# Patient Record
Sex: Male | Born: 1960
Health system: Southern US, Community
[De-identification: ages and names within clinical notes are randomized; demographics above are authoritative.]

## PROBLEM LIST (undated history)

## (undated) DIAGNOSIS — K429 Umbilical hernia without obstruction or gangrene: Secondary | ICD-10-CM

## (undated) DIAGNOSIS — R011 Cardiac murmur, unspecified: Secondary | ICD-10-CM

## (undated) DIAGNOSIS — K859 Acute pancreatitis without necrosis or infection, unspecified: Secondary | ICD-10-CM

## (undated) DIAGNOSIS — R7303 Prediabetes: Secondary | ICD-10-CM

## (undated) DIAGNOSIS — I10 Essential (primary) hypertension: Secondary | ICD-10-CM

## (undated) DIAGNOSIS — R002 Palpitations: Secondary | ICD-10-CM

## (undated) DIAGNOSIS — Z9889 Other specified postprocedural states: Secondary | ICD-10-CM

## (undated) DIAGNOSIS — K219 Gastro-esophageal reflux disease without esophagitis: Secondary | ICD-10-CM

## (undated) DIAGNOSIS — E7800 Pure hypercholesterolemia, unspecified: Secondary | ICD-10-CM

## (undated) DIAGNOSIS — E119 Type 2 diabetes mellitus without complications: Secondary | ICD-10-CM

## (undated) DIAGNOSIS — F419 Anxiety disorder, unspecified: Secondary | ICD-10-CM

## (undated) HISTORY — DX: Gastro-esophageal reflux disease without esophagitis: K21.9

## (undated) HISTORY — PX: OTHER SURGICAL HISTORY: SHX169

## (undated) HISTORY — PX: HERNIA REPAIR: SHX51

## (undated) HISTORY — DX: Prediabetes: R73.03

## (undated) HISTORY — DX: Other specified postprocedural states: Z98.890

## (undated) HISTORY — DX: Essential (primary) hypertension: I10

## (undated) HISTORY — DX: Pure hypercholesterolemia, unspecified: E78.00

## (undated) HISTORY — DX: Anxiety disorder, unspecified: F41.9

---

## 2007-01-16 ENCOUNTER — Ambulatory Visit (HOSPITAL_COMMUNITY): Admission: RE | Admit: 2007-01-16 | Discharge: 2007-01-16 | Payer: Self-pay | Admitting: *Deleted

## 2009-09-04 DIAGNOSIS — Z9889 Other specified postprocedural states: Secondary | ICD-10-CM

## 2009-09-04 HISTORY — DX: Other specified postprocedural states: Z98.890

## 2009-09-08 ENCOUNTER — Ambulatory Visit (HOSPITAL_COMMUNITY): Admission: RE | Admit: 2009-09-08 | Discharge: 2009-09-08 | Payer: Self-pay | Admitting: General Surgery

## 2009-12-19 ENCOUNTER — Ambulatory Visit (HOSPITAL_COMMUNITY): Admission: RE | Admit: 2009-12-19 | Discharge: 2009-12-19 | Payer: Self-pay | Admitting: Family Medicine

## 2010-04-20 LAB — GLUCOSE, CAPILLARY: Glucose-Capillary: 108 mg/dL — ABNORMAL HIGH (ref 70–99)

## 2010-08-23 ENCOUNTER — Other Ambulatory Visit (HOSPITAL_COMMUNITY): Payer: Self-pay | Admitting: Family Medicine

## 2010-08-23 DIAGNOSIS — R109 Unspecified abdominal pain: Secondary | ICD-10-CM

## 2010-08-23 DIAGNOSIS — Z719 Counseling, unspecified: Secondary | ICD-10-CM

## 2010-08-27 ENCOUNTER — Ambulatory Visit (HOSPITAL_COMMUNITY)
Admission: RE | Admit: 2010-08-27 | Discharge: 2010-08-27 | Disposition: A | Discharge status: Home / Self Care | Payer: 59 | Source: Ambulatory Visit | Attending: Family Medicine | Admitting: Family Medicine

## 2010-08-27 DIAGNOSIS — Z719 Counseling, unspecified: Secondary | ICD-10-CM

## 2010-08-27 DIAGNOSIS — R109 Unspecified abdominal pain: Secondary | ICD-10-CM | POA: Insufficient documentation

## 2010-09-11 ENCOUNTER — Encounter: Payer: Self-pay | Admitting: Gastroenterology

## 2010-09-11 ENCOUNTER — Ambulatory Visit (INDEPENDENT_AMBULATORY_CARE_PROVIDER_SITE_OTHER): Payer: 59 | Admitting: Gastroenterology

## 2010-09-11 VITALS — BP 134/78 | HR 72 | Temp 97.7°F | Ht 62.0 in | Wt 138.0 lb

## 2010-09-11 DIAGNOSIS — R1013 Epigastric pain: Secondary | ICD-10-CM

## 2010-09-11 MED ORDER — ESOMEPRAZOLE MAGNESIUM 40 MG PO CPDR: 40.0000 mg | DELAYED_RELEASE_CAPSULE | Freq: Every day | ORAL | Status: DC

## 2010-09-11 NOTE — Progress Notes (Signed)
Cc to PCP 

## 2010-09-11 NOTE — Assessment & Plan Note (Addendum)
50 year old male with onset of epigastric pain, slightly worsened with eating at times, resolved with trial of Nexium. Intermittent reflux in past with use of Prilosec OTC and Zantac for relief. [place] with fatty liver and contracted gallbladder, no evidence of stones. No melena noted. No change in bowel habits, N/V. Pt has not had UGI tract evaluated. Last colonoscopy a year ago, so this is up-to-date. No labs on file, H.pylori results to be obtained from Dr. Glo Herring office. Daily ETOH use. Differentials include gastritis, PUD, ETOH related gastritis, low-likelihood of malignancy, question remains of biliary component. Due to new onset dyspepsia, although resolved, will likely need EGD in very near future. Will do in OR with Propofol secondary to daily ETOH use. Discussed this need for EGD with pt; we will review labs first then proceed.   CBC, HFP (due to fatty liver on [place]) Retrieve H.pylori results Continue Nexium Likely proceed with EGD in near future after review of above labs.   Proceed with upper endoscopy in the near future with Dr. Oneida Alar, in [place] with propofol due to hx of daily ETOH use. The risks, benefits, and alternatives have been discussed in detail with patient. They have stated understanding and desire to proceed.

## 2010-09-11 NOTE — Progress Notes (Signed)
Referring Provider: Burnard Bunting, PA/McGough, Satira Anis, MD Primary Care Physician:  Leonides Grills, MD Primary Gastroenterologist:  Dr. Oneida Alar   Chief Complaint  Patient presents with  . Abdominal Pain    HPI:   Kyle Giles is a pleasant 50 year old Caucasian male who presents at the request of Burnard Bunting, [place] with Dr. Glo Herring ofice. He reports an onset of epigastric discomfort, cramping, around July 16th. No N/V, change in bowel habits. No dysphagia or odynophagia. Intermittent. At times worsened with eating. [place] of abdomen shows hepatic steatosis, contracted gallbladder without evidence of stones. He was started on Nexium 2 weeks ago, with resolution of symptoms. Prior to this, he had taken Zantac and Prilosec in the past for intermittent reflux. This was worse on his vacation recently. He is now cutting down on beer intake per his report. Watching diet. Rare NSAIDs, aspirin powders.   H.pylori serology reportedly negative. No results on file at time of visit.   Past Medical History  Diagnosis Date  . GERD (gastroesophageal reflux disease)   . Hypercholesterolemia   . Borderline diabetes   . Hypertension   . Anxiety   . S/P colonoscopy 2011    Dr. Arnoldo Morale     Past Surgical History  Procedure Date  . Pyloric stenosis repair     as baby, operated at [facility]  . Hernia repair     as baby    Current Outpatient Prescriptions  Medication Sig Dispense Refill  . ALPRAZolam (XANAX) 1 MG tablet Take 1 mg by mouth daily.        . bisoprolol-hydrochlorothiazide (ZIAC) 2.5-6.25 MG per tablet Take 1 tablet by mouth daily.        Marland Kitchen esomeprazole (NEXIUM) 40 MG capsule Take 1 capsule (40 mg total) by mouth daily. Take 30 minutes before breakfast.  31 capsule  3  . fenofibrate 160 MG tablet Take 160 mg by mouth daily.        Marland Kitchen lisinopril (PRINIVIL,ZESTRIL) 10 MG tablet Take 10 mg by mouth daily.        . metFORMIN (GLUCOPHAGE) 500 MG tablet Take 500 mg by mouth daily with breakfast.         . rosuvastatin (CRESTOR) 10 MG tablet Take 10 mg by mouth daily.          Allergies as of 09/11/2010  . (No Known Allergies)    Family History  Problem Relation Age of Onset  . Colon cancer Neg Hx     History   Social History  . Marital Status: Married    Spouse Name: N/A    Number of Children: 2  . Years of Education: N/A   Occupational History  . [place] History Main Topics  . Smoking status: Current Everyday Smoker -- 2.0 packs/day for 30 years    Types: Cigarettes  . Smokeless tobacco: Not on file  . Alcohol Use: Yes     a few beers in the evening  . Drug Use: Not on file  . Sexually Active: Not on file    Review of Systems: Gen: Denies any fever, chills, loss of appetite, fatigue, weight loss. CV: Denies chest pain, heart palpitations, syncope, peripheral edema. Resp: Denies shortness of breath with rest, cough, wheezing GI: Denies dysphagia or odynophagia. Denies hematemesis, fecal incontinence, or jaundice.  GU : Denies urinary burning, urinary frequency, urinary incontinence.  MS: Denies joint pain, muscle weakness, cramps, limited movement Derm: Denies rash, itching, dry  skin Psych: Denies depression, anxiety, confusion or memory loss  Heme: Denies bruising, bleeding, and enlarged lymph nodes.  Physical Exam: BP 134/78  Pulse 72  Temp(Src) 97.7 F (36.5 C) (Tympanic)  Ht 5' 2"  (1.575 m)  Wt 138 lb (62.596 kg)  BMI 25.24 kg/m2 General:   Alert and oriented. Well-developed, well-nourished, pleasant and cooperative. Head:  Normocephalic and atraumatic. Eyes:  Conjunctiva pink, sclera clear, no icterus.   Ears:  Normal auditory acuity. Nose:  No deformity, discharge,  or lesions. Mouth:  No deformity or lesions, mucosa pink and moist.  Neck:  Supple, without mass or thyromegaly. Lungs:  Clear to auscultation bilaterally, without wheezing, rales, or rhonchi.  Heart:  S1, S2 present without murmurs noted.    Abdomen:  +BS, soft, non-tender and non-distended. Without mass or HSM. No rebound or guarding. Small umbilical hernia. Rectal:  Deferred  Msk:  Symmetrical without gross deformities. Normal posture. Extremities:  Without clubbing or edema. Neurologic:  Alert and  oriented x4;  grossly normal neurologically. Skin:  Intact, warm and dry without significant lesions or rashes Cervical Nodes:  No significant cervical adenopathy. Psych:  Alert and cooperative. Normal mood and affect.

## 2010-09-11 NOTE — Patient Instructions (Signed)
Continue Nexium 40 mg daily. Take 30 minutes before breakfast each morning.  Please have labs drawn; we will be calling you with these results.  We may end up proceeding with an upper endoscopy. We will let you know the details after review of your labs.  Please contact us if any further episodes of pain.

## 2010-09-13 NOTE — Progress Notes (Signed)
Schedule pt in OR for EGD, dx: new onset dyspepsia 8/27

## 2010-09-13 NOTE — Progress Notes (Signed)
Please see recommendations by Dr. Oneida Alar. Need EGD in OR.

## 2010-09-18 LAB — CBC WITH DIFFERENTIAL/PLATELET
Basophils Absolute: 0 10*3/uL (ref 0.0–0.1)
Basophils Relative: 0 % (ref 0–1)
Eosinophils Absolute: 0.3 10*3/uL (ref 0.0–0.7)
Eosinophils Relative: 3 % (ref 0–5)
HCT: 39 % (ref 39.0–52.0)
Hemoglobin: 12.8 g/dL — ABNORMAL LOW (ref 13.0–17.0)
Lymphocytes Relative: 44 % (ref 12–46)
Lymphs Abs: 4 10*3/uL (ref 0.7–4.0)
MCH: 31.5 pg (ref 26.0–34.0)
MCHC: 32.8 g/dL (ref 30.0–36.0)
MCV: 96.1 fL (ref 78.0–100.0)
Monocytes Absolute: 0.6 10*3/uL (ref 0.1–1.0)
Monocytes Relative: 6 % (ref 3–12)
Neutro Abs: 4.3 10*3/uL (ref 1.7–7.7)
Neutrophils Relative %: 47 % (ref 43–77)
Platelets: 432 10*3/uL — ABNORMAL HIGH (ref 150–400)
RBC: 4.06 MIL/uL — ABNORMAL LOW (ref 4.22–5.81)
RDW: 13.8 % (ref 11.5–15.5)
WBC: 9.1 10*3/uL (ref 4.0–10.5)

## 2010-09-18 LAB — HEPATIC FUNCTION PANEL
ALT: 35 U/L (ref 0–53)
AST: 26 U/L (ref 0–37)
Albumin: 4.5 g/dL (ref 3.5–5.2)
Alkaline Phosphatase: 27 U/L — ABNORMAL LOW (ref 39–117)
Bilirubin, Direct: 0.1 mg/dL (ref 0.0–0.3)
Indirect Bilirubin: 0.4 mg/dL (ref 0.0–0.9)
Total Bilirubin: 0.5 mg/dL (ref 0.3–1.2)
Total Protein: 7.3 g/dL (ref 6.0–8.3)

## 2010-09-18 NOTE — Progress Notes (Signed)
Referral sent to kim in endo

## 2010-09-18 NOTE — Progress Notes (Signed)
Pt is scheduled for egd 10/02/10. His preop visit iis 09/25/2010@3 :30pm. Pt is aware and instructions placed in the mail to the pt.

## 2010-09-18 NOTE — Progress Notes (Signed)
Addended by: Idamae Schuller on: 09/18/2010 08:21 AM   Modules accepted: Orders

## 2010-09-20 NOTE — Progress Notes (Signed)
Quick Note:  Liver function looks good. Proceed as scheduled with EGD. ______

## 2010-09-24 NOTE — Progress Notes (Signed)
Quick Note:  LMOM to call. ______ 

## 2010-09-24 NOTE — Progress Notes (Signed)
Quick Note:  Pt informed ______ 

## 2010-09-25 ENCOUNTER — Encounter (HOSPITAL_COMMUNITY): Admission: RE | Admit: 2010-09-25 | Payer: 59 | Source: Ambulatory Visit

## 2010-09-25 ENCOUNTER — Telehealth: Payer: Self-pay | Admitting: Gastroenterology

## 2010-10-01 NOTE — Telephone Encounter (Signed)
NOTED

## 2010-10-02 ENCOUNTER — Encounter (HOSPITAL_COMMUNITY): Admission: RE | Payer: Self-pay | Source: Ambulatory Visit

## 2010-10-02 ENCOUNTER — Ambulatory Visit (HOSPITAL_COMMUNITY): Admission: RE | Admit: 2010-10-02 | Payer: 59 | Source: Ambulatory Visit | Admitting: Gastroenterology

## 2010-10-02 SURGERY — EGD (ESOPHAGOGASTRODUODENOSCOPY)
Anesthesia: Monitor Anesthesia Care

## 2010-10-15 ENCOUNTER — Telehealth: Payer: Self-pay | Admitting: Gastroenterology

## 2010-10-15 NOTE — Telephone Encounter (Signed)
May we please obtain the H.pylori results? I have the office visit from Dr. Orson Ape, but no H.pylori results. Thanks!

## 2010-10-16 NOTE — Telephone Encounter (Signed)
Requested.

## 2010-10-22 ENCOUNTER — Encounter: Payer: Self-pay | Admitting: Gastroenterology

## 2010-10-22 NOTE — Progress Notes (Signed)
Letter mailed

## 2010-10-22 NOTE — Progress Notes (Unsigned)
  Pt cancelled EGD. Was not satisfied during pre-op visit. Attempted to obtain H.pylori results from PCP. Received sheet with no information. Please send letter to pt offering f/u visit, updated on current status.

## 2010-12-10 ENCOUNTER — Other Ambulatory Visit: Payer: Self-pay

## 2010-12-11 MED ORDER — ESOMEPRAZOLE MAGNESIUM 40 MG PO CPDR: 40.0000 mg | DELAYED_RELEASE_CAPSULE | Freq: Every day | ORAL | Status: DC

## 2012-05-04 ENCOUNTER — Ambulatory Visit (HOSPITAL_COMMUNITY)
Admission: RE | Admit: 2012-05-04 | Discharge: 2012-05-04 | Disposition: A | Discharge status: Home / Self Care | Payer: BC Managed Care – PPO | Source: Ambulatory Visit | Attending: Family Medicine | Admitting: Family Medicine

## 2012-05-04 ENCOUNTER — Other Ambulatory Visit (HOSPITAL_COMMUNITY): Payer: Self-pay | Admitting: Family Medicine

## 2012-05-04 DIAGNOSIS — J209 Acute bronchitis, unspecified: Secondary | ICD-10-CM

## 2012-05-04 DIAGNOSIS — J189 Pneumonia, unspecified organism: Secondary | ICD-10-CM

## 2012-05-04 DIAGNOSIS — F17200 Nicotine dependence, unspecified, uncomplicated: Secondary | ICD-10-CM | POA: Insufficient documentation

## 2012-05-04 DIAGNOSIS — R059 Cough, unspecified: Secondary | ICD-10-CM | POA: Insufficient documentation

## 2012-05-04 DIAGNOSIS — R05 Cough: Secondary | ICD-10-CM | POA: Insufficient documentation

## 2015-01-31 ENCOUNTER — Encounter: Payer: Self-pay | Admitting: Gastroenterology

## 2015-02-14 ENCOUNTER — Ambulatory Visit: Payer: Self-pay | Admitting: Nurse Practitioner

## 2016-04-04 ENCOUNTER — Other Ambulatory Visit (HOSPITAL_COMMUNITY): Payer: Self-pay | Admitting: Family Medicine

## 2016-04-04 ENCOUNTER — Ambulatory Visit (HOSPITAL_COMMUNITY)
Admission: RE | Admit: 2016-04-04 | Discharge: 2016-04-04 | Disposition: A | Discharge status: Home / Self Care | Payer: 59 | Source: Ambulatory Visit | Attending: Family Medicine | Admitting: Family Medicine

## 2016-04-04 DIAGNOSIS — Z6825 Body mass index (BMI) 25.0-25.9, adult: Secondary | ICD-10-CM | POA: Insufficient documentation

## 2016-04-04 DIAGNOSIS — E663 Overweight: Secondary | ICD-10-CM | POA: Diagnosis not present

## 2016-04-04 DIAGNOSIS — J209 Acute bronchitis, unspecified: Secondary | ICD-10-CM

## 2016-04-04 DIAGNOSIS — I700 Atherosclerosis of aorta: Secondary | ICD-10-CM | POA: Diagnosis not present

## 2016-11-28 DIAGNOSIS — Z23 Encounter for immunization: Secondary | ICD-10-CM | POA: Diagnosis not present

## 2016-11-29 DIAGNOSIS — F419 Anxiety disorder, unspecified: Secondary | ICD-10-CM | POA: Diagnosis not present

## 2016-11-29 DIAGNOSIS — Z1389 Encounter for screening for other disorder: Secondary | ICD-10-CM | POA: Diagnosis not present

## 2016-11-29 DIAGNOSIS — E663 Overweight: Secondary | ICD-10-CM | POA: Diagnosis not present

## 2016-11-29 DIAGNOSIS — Z6826 Body mass index (BMI) 26.0-26.9, adult: Secondary | ICD-10-CM | POA: Diagnosis not present

## 2016-12-09 DIAGNOSIS — I10 Essential (primary) hypertension: Secondary | ICD-10-CM | POA: Diagnosis not present

## 2016-12-09 DIAGNOSIS — Z6825 Body mass index (BMI) 25.0-25.9, adult: Secondary | ICD-10-CM | POA: Diagnosis not present

## 2016-12-09 DIAGNOSIS — Z1389 Encounter for screening for other disorder: Secondary | ICD-10-CM | POA: Diagnosis not present

## 2016-12-09 DIAGNOSIS — E782 Mixed hyperlipidemia: Secondary | ICD-10-CM | POA: Diagnosis not present

## 2016-12-09 DIAGNOSIS — E663 Overweight: Secondary | ICD-10-CM | POA: Diagnosis not present

## 2016-12-09 DIAGNOSIS — E1165 Type 2 diabetes mellitus with hyperglycemia: Secondary | ICD-10-CM | POA: Diagnosis not present

## 2017-03-03 DIAGNOSIS — R5383 Other fatigue: Secondary | ICD-10-CM | POA: Diagnosis not present

## 2017-03-03 DIAGNOSIS — F419 Anxiety disorder, unspecified: Secondary | ICD-10-CM | POA: Diagnosis not present

## 2017-03-03 DIAGNOSIS — Z6825 Body mass index (BMI) 25.0-25.9, adult: Secondary | ICD-10-CM | POA: Diagnosis not present

## 2017-03-03 DIAGNOSIS — Z1389 Encounter for screening for other disorder: Secondary | ICD-10-CM | POA: Diagnosis not present

## 2017-03-03 DIAGNOSIS — E663 Overweight: Secondary | ICD-10-CM | POA: Diagnosis not present

## 2017-06-02 DIAGNOSIS — J309 Allergic rhinitis, unspecified: Secondary | ICD-10-CM | POA: Diagnosis not present

## 2017-06-02 DIAGNOSIS — Z1389 Encounter for screening for other disorder: Secondary | ICD-10-CM | POA: Diagnosis not present

## 2017-06-02 DIAGNOSIS — Z6826 Body mass index (BMI) 26.0-26.9, adult: Secondary | ICD-10-CM | POA: Diagnosis not present

## 2017-06-02 DIAGNOSIS — E663 Overweight: Secondary | ICD-10-CM | POA: Diagnosis not present

## 2017-06-02 DIAGNOSIS — E782 Mixed hyperlipidemia: Secondary | ICD-10-CM | POA: Diagnosis not present

## 2017-06-02 DIAGNOSIS — E119 Type 2 diabetes mellitus without complications: Secondary | ICD-10-CM | POA: Diagnosis not present

## 2017-08-26 DIAGNOSIS — E663 Overweight: Secondary | ICD-10-CM | POA: Diagnosis not present

## 2017-08-26 DIAGNOSIS — E782 Mixed hyperlipidemia: Secondary | ICD-10-CM | POA: Diagnosis not present

## 2017-08-26 DIAGNOSIS — Z1389 Encounter for screening for other disorder: Secondary | ICD-10-CM | POA: Diagnosis not present

## 2017-08-26 DIAGNOSIS — I10 Essential (primary) hypertension: Secondary | ICD-10-CM | POA: Diagnosis not present

## 2017-08-26 DIAGNOSIS — Z6825 Body mass index (BMI) 25.0-25.9, adult: Secondary | ICD-10-CM | POA: Diagnosis not present

## 2017-09-10 DIAGNOSIS — Z6825 Body mass index (BMI) 25.0-25.9, adult: Secondary | ICD-10-CM | POA: Diagnosis not present

## 2017-09-10 DIAGNOSIS — E663 Overweight: Secondary | ICD-10-CM | POA: Diagnosis not present

## 2017-09-10 DIAGNOSIS — Z1389 Encounter for screening for other disorder: Secondary | ICD-10-CM | POA: Diagnosis not present

## 2017-09-10 DIAGNOSIS — E119 Type 2 diabetes mellitus without complications: Secondary | ICD-10-CM | POA: Diagnosis not present

## 2017-11-17 DIAGNOSIS — Z23 Encounter for immunization: Secondary | ICD-10-CM | POA: Diagnosis not present

## 2017-12-01 DIAGNOSIS — Z1389 Encounter for screening for other disorder: Secondary | ICD-10-CM | POA: Diagnosis not present

## 2017-12-01 DIAGNOSIS — F419 Anxiety disorder, unspecified: Secondary | ICD-10-CM | POA: Diagnosis not present

## 2017-12-01 DIAGNOSIS — Z6824 Body mass index (BMI) 24.0-24.9, adult: Secondary | ICD-10-CM | POA: Diagnosis not present

## 2018-03-02 DIAGNOSIS — Z6822 Body mass index (BMI) 22.0-22.9, adult: Secondary | ICD-10-CM | POA: Diagnosis not present

## 2018-03-02 DIAGNOSIS — Z1389 Encounter for screening for other disorder: Secondary | ICD-10-CM | POA: Diagnosis not present

## 2018-03-02 DIAGNOSIS — E119 Type 2 diabetes mellitus without complications: Secondary | ICD-10-CM | POA: Diagnosis not present

## 2018-03-02 DIAGNOSIS — E1165 Type 2 diabetes mellitus with hyperglycemia: Secondary | ICD-10-CM | POA: Diagnosis not present

## 2018-03-02 DIAGNOSIS — F419 Anxiety disorder, unspecified: Secondary | ICD-10-CM | POA: Diagnosis not present

## 2018-03-02 DIAGNOSIS — I10 Essential (primary) hypertension: Secondary | ICD-10-CM | POA: Diagnosis not present

## 2018-05-18 DIAGNOSIS — F419 Anxiety disorder, unspecified: Secondary | ICD-10-CM | POA: Diagnosis not present

## 2018-08-31 DIAGNOSIS — Z1389 Encounter for screening for other disorder: Secondary | ICD-10-CM | POA: Diagnosis not present

## 2018-08-31 DIAGNOSIS — E119 Type 2 diabetes mellitus without complications: Secondary | ICD-10-CM | POA: Diagnosis not present

## 2018-08-31 DIAGNOSIS — Z6824 Body mass index (BMI) 24.0-24.9, adult: Secondary | ICD-10-CM | POA: Diagnosis not present

## 2018-08-31 DIAGNOSIS — I10 Essential (primary) hypertension: Secondary | ICD-10-CM | POA: Diagnosis not present

## 2018-08-31 DIAGNOSIS — E7849 Other hyperlipidemia: Secondary | ICD-10-CM | POA: Diagnosis not present

## 2018-09-14 DIAGNOSIS — E119 Type 2 diabetes mellitus without complications: Secondary | ICD-10-CM | POA: Diagnosis not present

## 2018-11-19 DIAGNOSIS — Z23 Encounter for immunization: Secondary | ICD-10-CM | POA: Diagnosis not present

## 2018-12-01 DIAGNOSIS — F419 Anxiety disorder, unspecified: Secondary | ICD-10-CM | POA: Diagnosis not present

## 2018-12-01 DIAGNOSIS — E785 Hyperlipidemia, unspecified: Secondary | ICD-10-CM | POA: Diagnosis not present

## 2018-12-01 DIAGNOSIS — I10 Essential (primary) hypertension: Secondary | ICD-10-CM | POA: Diagnosis not present

## 2018-12-01 DIAGNOSIS — Z6825 Body mass index (BMI) 25.0-25.9, adult: Secondary | ICD-10-CM | POA: Diagnosis not present

## 2018-12-01 DIAGNOSIS — E119 Type 2 diabetes mellitus without complications: Secondary | ICD-10-CM | POA: Diagnosis not present

## 2019-01-28 ENCOUNTER — Other Ambulatory Visit: Payer: Self-pay

## 2019-01-28 ENCOUNTER — Ambulatory Visit: Payer: BLUE CROSS/BLUE SHIELD | Attending: Internal Medicine

## 2019-01-28 DIAGNOSIS — Z20822 Contact with and (suspected) exposure to covid-19: Secondary | ICD-10-CM

## 2019-01-28 DIAGNOSIS — Z20828 Contact with and (suspected) exposure to other viral communicable diseases: Secondary | ICD-10-CM | POA: Insufficient documentation

## 2019-01-29 LAB — NOVEL CORONAVIRUS, NAA: SARS-CoV-2, NAA: NOT DETECTED

## 2019-02-24 DIAGNOSIS — F419 Anxiety disorder, unspecified: Secondary | ICD-10-CM | POA: Diagnosis not present

## 2019-02-24 DIAGNOSIS — E785 Hyperlipidemia, unspecified: Secondary | ICD-10-CM | POA: Diagnosis not present

## 2019-02-24 DIAGNOSIS — I10 Essential (primary) hypertension: Secondary | ICD-10-CM | POA: Diagnosis not present

## 2019-02-24 DIAGNOSIS — E119 Type 2 diabetes mellitus without complications: Secondary | ICD-10-CM | POA: Diagnosis not present

## 2019-02-24 DIAGNOSIS — Z1389 Encounter for screening for other disorder: Secondary | ICD-10-CM | POA: Diagnosis not present

## 2019-02-24 DIAGNOSIS — Z6824 Body mass index (BMI) 24.0-24.9, adult: Secondary | ICD-10-CM | POA: Diagnosis not present

## 2019-04-10 ENCOUNTER — Other Ambulatory Visit: Payer: Self-pay

## 2019-04-10 ENCOUNTER — Ambulatory Visit: Payer: BC Managed Care – PPO | Attending: Internal Medicine

## 2019-04-10 DIAGNOSIS — Z23 Encounter for immunization: Secondary | ICD-10-CM | POA: Insufficient documentation

## 2019-04-10 NOTE — Progress Notes (Signed)
   Covid-19 Vaccination Clinic  Name:  Kyle Giles    MRN: [id number] DOB: 03/19/1960  04/10/2019  Mr. [name] was observed post Covid-19 immunization for 15 minutes without incident. He was provided with Vaccine Information Sheet and instruction to access the V-Safe system.   Mr. [name] was instructed to call 911 with any severe reactions post vaccine: Marland Kitchen Difficulty breathing  . Swelling of face and throat  . A fast heartbeat  . A bad rash all over body  . Dizziness and weakness   Immunizations Administered    Name Date Dose VIS Date Route   Moderna COVID-19 Vaccine 04/10/2019  8:25 AM 0.5 mL 01/05/2019 Intramuscular   Manufacturer: Moderna   Lot: [id number]   [place]: [id number]

## 2019-05-12 ENCOUNTER — Ambulatory Visit: Payer: BC Managed Care – PPO | Attending: Internal Medicine

## 2019-05-12 DIAGNOSIS — Z23 Encounter for immunization: Secondary | ICD-10-CM

## 2019-05-12 NOTE — Progress Notes (Signed)
   Covid-19 Vaccination Clinic  Name:  Kyle Giles    MRN: [id number] DOB: 12/02/1960  05/12/2019  [name] was observed post Covid-19 immunization for 15 minutes without incident. He was provided with Vaccine Information Sheet and instruction to access the V-Safe system.   [name] was instructed to call 911 with any severe reactions post vaccine: Marland Kitchen Difficulty breathing  . Swelling of face and throat  . A fast heartbeat  . A bad rash all over body  . Dizziness and weakness   Immunizations Administered    Name Date Dose VIS Date Route   Moderna COVID-19 Vaccine 05/12/2019  3:49 PM 0.5 mL 01/05/2019 Intramuscular   Manufacturer: Levan Hurst   Lot: [id number]   [place]: [id number]

## 2019-05-21 DIAGNOSIS — E119 Type 2 diabetes mellitus without complications: Secondary | ICD-10-CM | POA: Diagnosis not present

## 2019-05-21 DIAGNOSIS — I10 Essential (primary) hypertension: Secondary | ICD-10-CM | POA: Diagnosis not present

## 2019-05-21 DIAGNOSIS — F419 Anxiety disorder, unspecified: Secondary | ICD-10-CM | POA: Diagnosis not present

## 2019-05-21 DIAGNOSIS — Z719 Counseling, unspecified: Secondary | ICD-10-CM | POA: Diagnosis not present

## 2019-05-21 DIAGNOSIS — E1165 Type 2 diabetes mellitus with hyperglycemia: Secondary | ICD-10-CM | POA: Diagnosis not present

## 2019-05-21 DIAGNOSIS — Z6824 Body mass index (BMI) 24.0-24.9, adult: Secondary | ICD-10-CM | POA: Diagnosis not present

## 2019-05-21 DIAGNOSIS — E785 Hyperlipidemia, unspecified: Secondary | ICD-10-CM | POA: Diagnosis not present

## 2019-06-29 ENCOUNTER — Ambulatory Visit: Payer: BC Managed Care – PPO | Admitting: Cardiovascular Disease

## 2019-06-29 ENCOUNTER — Encounter: Payer: Self-pay | Admitting: Cardiovascular Disease

## 2019-06-29 ENCOUNTER — Other Ambulatory Visit: Payer: Self-pay

## 2019-06-29 VITALS — BP 144/66 | HR 96 | Ht 62.0 in | Wt 133.8 lb

## 2019-06-29 DIAGNOSIS — R002 Palpitations: Secondary | ICD-10-CM | POA: Diagnosis not present

## 2019-06-29 DIAGNOSIS — R011 Cardiac murmur, unspecified: Secondary | ICD-10-CM

## 2019-06-29 DIAGNOSIS — I10 Essential (primary) hypertension: Secondary | ICD-10-CM | POA: Diagnosis not present

## 2019-06-29 NOTE — Patient Instructions (Signed)
Medication Instructions:  Your physician recommends that you continue on your current medications as directed. Please refer to the Current Medication list given to you today.  *If you need a refill on your cardiac medications before your next appointment, please call your pharmacy*   Lab Work: None today If you have labs (blood work) drawn today and your tests are completely normal, you will receive your results only by: Marland Kitchen MyChart Message (if you have MyChart) OR . A paper copy in the mail If you have any lab test that is abnormal or we need to change your treatment, we will call you to review the results.   Testing/Procedures: Your physician has requested that you have an echocardiogram. Echocardiography is a painless test that uses sound waves to create images of your heart. It provides your doctor with information about the size and shape of your heart and how well your heart's chambers and valves are working. This procedure takes approximately one hour. There are no restrictions for this procedure.     Follow-Up: At [facility], you and your health needs are our priority.  As part of our continuing mission to provide you with exceptional heart care, we have created designated Provider Care Teams.  These Care Teams include your primary Cardiologist (physician) and Advanced Practice Providers (APPs -  Physician Assistants and Nurse Practitioners) who all work together to provide you with the care you need, when you need it.  We recommend signing up for the patient portal called "MyChart".  Sign up information is provided on this After Visit Summary.  MyChart is used to connect with patients for Virtual Visits (Telemedicine).  Patients are able to view lab/test results, encounter notes, upcoming appointments, etc.  Non-urgent messages can be sent to your provider as well.   To learn more about what you can do with MyChart, go to [email or web address].    Your next appointment:   4  month(s)  The format for your next appointment:   In Person  Provider:   Kate Sable, MD   Other Instructions None       Thank you for choosing [facility] !

## 2019-06-29 NOTE — Progress Notes (Signed)
CARDIOLOGY CONSULT NOTE  Patient ID: Kyle Giles MRN: [id number] DOB/AGE: September 08, 1960 59 y.o.  Admit date: (Not on file) Primary Physician: Elsie Lincoln, MD  Reason for Consultation: Palpitations  HPI: Kyle Giles is a 59 y.o. male who is being seen today for the evaluation of palpitations at the request of Sharilyn Sites, MD.   I reviewed all relevant documentation from his PCP along with labs and studies.  Past medical history includes hypertension, anxiety, and diabetes mellitus.  Lipids on 02/24/2019 showed total cholesterol 170, HDL 36, LDL 107, triglycerides 150.  TSH was 2.02 and BUN was 9.  Hemoglobin A1c 5.5% on 05/21/2019.  ECG performed in the office today which I ordered and personally interpreted demonstrates normal sinus rhythm with no ischemic ST segment or T-wave abnormalities, nor any arrhythmias.  Prior to the pandemic, he had been walking routinely denies exertional chest pain and dyspnea.  He did not have any palpitations at that time.  Over the past year he has noted an accelerated heart rate at times when he wakes up in the morning.  It has not been worrisome enough to provoke an ED visit.  He walks 2-3 flights of stairs every day and has noticed some mild shortness of breath by the time he gets to the top of the stairs.  He told me he knows he needs to begin exercising again.  He denies leg swelling, orthopnea, and paroxysmal nocturnal dyspnea.  He denies dizziness and syncope.  Family history: His father underwent aortic valve replacement in his early 81s.  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    No Known Allergies  Current Outpatient Medications  Medication Sig Dispense Refill  . acetaminophen (TYLENOL) 500 MG tablet Take 1,000 mg by mouth every 6 (six) hours as needed. Pain     . ALPRAZolam (XANAX) 1 MG tablet Take 1 mg by mouth daily as needed. Anxiety    . amLODipine (NORVASC) 5 MG  tablet Take 5 mg by mouth daily.    . fenofibrate 160 MG tablet Take 160 mg by mouth daily.      Marland Kitchen lisinopril (ZESTRIL) 20 MG tablet Take 20 mg by mouth 2 (two) times daily.    . metFORMIN (GLUCOPHAGE) 500 MG tablet Take 500 mg by mouth daily with breakfast.      . simvastatin (ZOCOR) 20 MG tablet Take 20 mg by mouth daily.     No current facility-administered medications for this visit.    Past Medical History:  Diagnosis Date  . Anxiety   . Borderline diabetes   . GERD (gastroesophageal reflux disease)   . Hypercholesterolemia   . Hypertension   . S/P colonoscopy August 2011   Dr. Arnoldo Morale: Moderate diverticulosis throughout colon, otherwise normal     Past Surgical History:  Procedure Laterality Date  . HERNIA REPAIR     as baby  . pyloric stenosis repair     as baby, operated at [place] History  . Marital status: Married    Spouse name: Not on file  . Number of children: 2  . Years of education: Not on file  . Highest education level: Not on file  Occupational History  . 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[employer]    Comment: [employer]  Tobacco Use  . Smoking status: Current Every Day Smoker    Packs/day: 1.50  Years: 30.00    Pack years: 45.00    Types: Cigarettes  . Smokeless tobacco: Never Used  Substance and Sexual Activity  . Alcohol use: Yes    Comment: a few beers in the evening  . Drug use: Not on file  . Sexual activity: Not on file  Other Topics Concern  . Not on file  Social History Narrative  . Not on file   Social Determinants of Health   Financial Resource Strain:   . Difficulty of Paying Living Expenses:   Food Insecurity:   . Worried About Charity fundraiser in the Last Year:   . Arboriculturist in the Last Year:   Transportation Needs:   . Film/video editor (Medical):   Marland Kitchen Lack of Transportation (Non-Medical):   Physical Activity:   . Days of Exercise per Week:   .  Minutes of Exercise per Session:   Stress:   . Feeling of Stress :   Social Connections:   . Frequency of Communication with Friends and Family:   . Frequency of Social Gatherings with Friends and Family:   . Attends Religious Services:   . Active Member of Clubs or Organizations:   . Attends Archivist Meetings:   Marland Kitchen Marital Status:   Intimate Partner Violence:   . Fear of Current or Ex-Partner:   . Emotionally Abused:   Marland Kitchen Physically Abused:   . Sexually Abused:       Current Meds  Medication Sig  . acetaminophen (TYLENOL) 500 MG tablet Take 1,000 mg by mouth every 6 (six) hours as needed. Pain   . ALPRAZolam (XANAX) 1 MG tablet Take 1 mg by mouth daily as needed. Anxiety  . amLODipine (NORVASC) 5 MG tablet Take 5 mg by mouth daily.  . fenofibrate 160 MG tablet Take 160 mg by mouth daily.    Marland Kitchen lisinopril (ZESTRIL) 20 MG tablet Take 20 mg by mouth 2 (two) times daily.  . metFORMIN (GLUCOPHAGE) 500 MG tablet Take 500 mg by mouth daily with breakfast.    . simvastatin (ZOCOR) 20 MG tablet Take 20 mg by mouth daily.      Review of systems complete and found to be negative unless listed above in HPI   Physical exam Blood pressure (!) 144/66, pulse 96, height 5' 2"  (1.575 m), weight 133 lb 12.8 oz (60.7 kg), SpO2 99 %. General: NAD Neck: No JVD, no thyromegaly or thyroid nodule.  Lungs: Clear to auscultation bilaterally with normal respiratory effort. CV: Nondisplaced PMI. Regular rate and rhythm, normal S1/S2, no T4/S5, soft systolic murmur over RUSB.  No peripheral edema.  No carotid bruit.    Abdomen: Soft, nontender, no distention.  Skin: Intact without lesions or rashes.  Neurologic: Alert and oriented x 3.  Psych: Normal affect. Extremities: No clubbing or cyanosis.  HEENT: Normal.   ECG: Most recent ECG reviewed.   Labs: Lab Results  Component Value Date/Time   ALT 35 09/11/2010 08:33 AM   HGB 12.8 (L) 09/11/2010 08:33 AM     Lipids: No results  found for: LDLCALC, LDLDIRECT, CHOL, TRIG, HDL      ASSESSMENT AND PLAN:   1.  Palpitations: It appears he has a mildly elevated heart rates in the morning.  There is no sudden starting or stopping to suggest SVT.  He thinks he needs to begin exercising as he did not have the symptoms prior to the pandemic and I am in agreement with him.  I  have suggested he get into a routine habit of walking at least 5 days/week for 30 minutes if not longer.  I will reassess his symptoms in 4 months to see if a cardiac monitor is warranted.  He has no symptoms to suggest angina per se which would warrant stress testing.  2.  Cardiac murmur: He has an aortic murmur.  Given his family history of his father having undergone aortic valve replacement surgery in his early 55s, it leads me to believe that his father may have had bicuspid aortic valve stenosis.  I will obtain an echocardiogram to evaluate cardiac structure and function and valvular status with a focus on the aortic valve in particular.  3.  Hypertension: BP is mildly elevated.  No changes to therapy at this time.     Disposition: Follow up in 4 months  Signed: Kate Sable, M.D., F.A.C.C.  06/29/2019, 9:08 AM

## 2019-08-13 DIAGNOSIS — K429 Umbilical hernia without obstruction or gangrene: Secondary | ICD-10-CM | POA: Diagnosis not present

## 2019-08-13 DIAGNOSIS — Z6823 Body mass index (BMI) 23.0-23.9, adult: Secondary | ICD-10-CM | POA: Diagnosis not present

## 2019-08-13 DIAGNOSIS — F419 Anxiety disorder, unspecified: Secondary | ICD-10-CM | POA: Diagnosis not present

## 2019-08-16 ENCOUNTER — Other Ambulatory Visit: Payer: Self-pay

## 2019-08-16 ENCOUNTER — Ambulatory Visit (HOSPITAL_COMMUNITY)
Admission: RE | Admit: 2019-08-16 | Discharge: 2019-08-16 | Disposition: A | Discharge status: Home / Self Care | Payer: BC Managed Care – PPO | Source: Ambulatory Visit | Attending: Cardiovascular Disease | Admitting: Cardiovascular Disease

## 2019-08-16 DIAGNOSIS — R011 Cardiac murmur, unspecified: Secondary | ICD-10-CM | POA: Diagnosis not present

## 2019-08-16 NOTE — Progress Notes (Signed)
*  PRELIMINARY RESULTS* Echocardiogram 2D Echocardiogram has been performed.  Kyle Giles 08/16/2019, 9:13 AM

## 2019-09-16 ENCOUNTER — Ambulatory Visit: Payer: Self-pay | Admitting: Surgery

## 2019-09-16 DIAGNOSIS — M6208 Separation of muscle (nontraumatic), other site: Secondary | ICD-10-CM | POA: Diagnosis not present

## 2019-09-16 DIAGNOSIS — K429 Umbilical hernia without obstruction or gangrene: Secondary | ICD-10-CM | POA: Diagnosis not present

## 2019-09-16 NOTE — H&P (Signed)
Surgical Evaluation  HPI: very pleasant 59 year old man with chief complaint umbilical hernia.  This has been present for many years, he is evaluated by his primary care doctors over the years and has always been told that if it wasn't bothering him to leave it alone.  For the last couple years it has begun to bother him due to increasing size and occasional discomfort in the area.  Denies GI symptoms.  He does have a history of pyloromyotomy as an infant via a transverse upper abdominal incision, and had a hernia repair that he does not know what kind of hernia it was, this was done before he was-year-old.  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 He does smoke a pack and a half of cigarettes per day he has tried to quit many times.  He is willing to cut down for surgery.  No Known Allergies  Past Medical History:  Diagnosis Date  . Anxiety   . Borderline diabetes   . GERD (gastroesophageal reflux disease)   . Hypercholesterolemia   . Hypertension   . S/P colonoscopy August 2011   Dr. Arnoldo Morale: Moderate diverticulosis throughout colon, otherwise normal     Past Surgical History:  Procedure Laterality Date  . HERNIA REPAIR     as baby  . pyloric stenosis repair     as baby, operated at [place] History  Problem Relation Age of Onset  . Colon cancer Neg Hx     Social History   Socioeconomic History  . Marital status: Married    Spouse name: Not on file  . Number of children: 2  . Years of education: Not on file  . Highest education level: Not on file  Occupational History  . 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[employer]    Comment: [employer]  Tobacco Use  . Smoking status: Current Every Day Smoker    Packs/day: 1.50    Years: 30.00    Pack years: 45.00    Types: Cigarettes  . Smokeless tobacco: Never Used  Substance and Sexual Activity  . Alcohol use: Yes    Comment: a few beers  in the evening  . Drug use: Not on file  . Sexual activity: Not on file  Other Topics Concern  . Not on file  Social History Narrative  . Not on file   Social Determinants of Health   Financial Resource Strain:   . Difficulty of Paying Living Expenses:   Food Insecurity:   . Worried About Charity fundraiser in the Last Year:   . Arboriculturist in the Last Year:   Transportation Needs:   . Film/video editor (Medical):   Marland Kitchen Lack of Transportation (Non-Medical):   Physical Activity:   . Days of Exercise per Week:   . Minutes of Exercise per Session:   Stress:   . Feeling of Stress :   Social Connections:   . Frequency of Communication with Friends and Family:   . Frequency of Social Gatherings with Friends and Family:   . Attends Religious Services:   . Active Member of Clubs or Organizations:   . Attends Archivist Meetings:   Marland Kitchen Marital Status:     Current Outpatient Medications on File Prior to Visit  Medication Sig Dispense Refill  . acetaminophen (TYLENOL) 500 MG tablet Take 1,000 mg by mouth every 6 (six) hours  as needed. Pain     . ALPRAZolam (XANAX) 1 MG tablet Take 1 mg by mouth daily as needed. Anxiety    . amLODipine (NORVASC) 5 MG tablet Take 5 mg by mouth daily.    . fenofibrate 160 MG tablet Take 160 mg by mouth daily.      Marland Kitchen lisinopril (ZESTRIL) 20 MG tablet Take 20 mg by mouth 2 (two) times daily.    . metFORMIN (GLUCOPHAGE) 500 MG tablet Take 500 mg by mouth daily with breakfast.      . simvastatin (ZOCOR) 20 MG tablet Take 20 mg by mouth daily.     No current facility-administered medications on file prior to visit.    Review of Systems: a complete, 10pt review of systems was completed with pertinent positives and negatives as documented in the HPI  Physical Exam: Vitals  Weight: 129.6 lb Height: 62in Body Surface Area: 1.59 m Body Mass Index: 23.7 kg/m  Temp.: 98.105F(Temporal)  Pulse: 99 (Regular)  P.OX: 97% (Room  air) BP: 162/80(Sitting, Left Arm, Standard)  Alert and well-appearing. Unlabored respirations. Abdomen is soft, nontender nondistended. There is a reducible hernia most prominent just superior to the umbilicus, when protruding it is about a centimeters in diameter. This is situated within a approximately 4 cm wide diastasis recti.   CBC Latest Ref Rng & Units 09/11/2010  WBC 4.0 - 10.5 K/uL 9.1  Hemoglobin 13.0 - 17.0 g/dL 12.8(L)  Hematocrit 39 - 52 % 39.0  Platelets 150 - 400 K/uL 432(H)    CMP Latest Ref Rng & Units 09/11/2010  Total Protein 6.0 - 8.3 g/dL 7.3  Total Bilirubin 0.3 - 1.2 mg/dL 0.5  Alkaline Phos 39 - 117 U/L 27(L)  AST 0 - 37 U/L 26  ALT 0 - 53 U/L 35    No results found for: INR, PROTIME  Imaging: No results found.   A/P: UMBILICAL HERNIA (P10.2) Story: Reducible, but increasing in size and increasingly symptomatic. Discussed options for repair and I would recommend an open approach with mesh. We discussed the procedure and risks of bleeding, infection, pain, scarring, injury to intra-abdominal structures, wound infection or poor healing, and hernia recurrence, the latter 2 being higher risk for this man who is an active smoker. Also discussed systemic risks of general anesthesia including cardiovascular, pulmonary, embolic complications. He is trying to quit, and we'll try to cut down significantly before surgery. Questions were welcomed and answered to his satisfaction, he would like to proceed with scheduling.   DIASTASIS RECTI (M62.08) Story: Discussed that this will not change after surgery, and going forward physical therapy/core strengthening exercises may be helpful to prevent progression    Patient Active Problem List   Diagnosis Date Noted  . Epigastric pain 09/11/2010       Romana Juniper, MD [facility]  See AMION to contact appropriate on-call provider

## 2019-10-19 ENCOUNTER — Other Ambulatory Visit: Payer: Self-pay

## 2019-10-19 ENCOUNTER — Encounter (HOSPITAL_BASED_OUTPATIENT_CLINIC_OR_DEPARTMENT_OTHER): Payer: Self-pay | Admitting: Surgery

## 2019-10-19 NOTE — Progress Notes (Signed)
Spoke w/ via phone for pre-op interview---pt Lab needs dos----I stat 8               Lab results------ekg  06-29-2019 epic lov dr Bronson Ing 06-29-2019, echo 06-29-2019 COVID test ------10-27-2019 at 1500 pm Arrive at -------1130 am 10-29-2019 NPO after MN NO Solid Food.  Clear liquids from MN until---1030 am then npo Medications to take morning of surgery -----xanax prn, fenofibrate Diabetic medication -----none day of surgery Patient Special Instructions -----hibiclens showe hs and am Pre-Op special Istructions -----none Patient verbalized understanding of instructions that were given at this phone interview. Patient denies shortness of breath, chest pain, fever, cough at this phone interview.

## 2019-10-26 ENCOUNTER — Other Ambulatory Visit (HOSPITAL_COMMUNITY): Payer: BC Managed Care – PPO

## 2019-10-27 ENCOUNTER — Other Ambulatory Visit (HOSPITAL_COMMUNITY)
Admission: RE | Admit: 2019-10-27 | Discharge: 2019-10-27 | Disposition: A | Discharge status: Home / Self Care | Payer: BC Managed Care – PPO | Source: Ambulatory Visit | Attending: Surgery | Admitting: Surgery

## 2019-10-27 DIAGNOSIS — Z01812 Encounter for preprocedural laboratory examination: Secondary | ICD-10-CM | POA: Diagnosis not present

## 2019-10-27 DIAGNOSIS — Z20822 Contact with and (suspected) exposure to covid-19: Secondary | ICD-10-CM | POA: Insufficient documentation

## 2019-10-27 LAB — SARS CORONAVIRUS 2 (TAT 6-24 HRS): SARS Coronavirus 2: NEGATIVE

## 2019-10-29 ENCOUNTER — Encounter (HOSPITAL_BASED_OUTPATIENT_CLINIC_OR_DEPARTMENT_OTHER)
Admission: RE | Disposition: A | Discharge status: Home / Self Care | Payer: Self-pay | Source: Home / Self Care | Attending: Surgery

## 2019-10-29 ENCOUNTER — Ambulatory Visit (HOSPITAL_BASED_OUTPATIENT_CLINIC_OR_DEPARTMENT_OTHER): Payer: BC Managed Care – PPO | Admitting: Anesthesiology

## 2019-10-29 ENCOUNTER — Encounter (HOSPITAL_BASED_OUTPATIENT_CLINIC_OR_DEPARTMENT_OTHER): Payer: Self-pay | Admitting: Surgery

## 2019-10-29 ENCOUNTER — Ambulatory Visit (HOSPITAL_BASED_OUTPATIENT_CLINIC_OR_DEPARTMENT_OTHER)
Admission: RE | Admit: 2019-10-29 | Discharge: 2019-10-29 | Disposition: A | Discharge status: Home / Self Care | Payer: BC Managed Care – PPO | Attending: Surgery | Admitting: Surgery

## 2019-10-29 DIAGNOSIS — F419 Anxiety disorder, unspecified: Secondary | ICD-10-CM | POA: Diagnosis not present

## 2019-10-29 DIAGNOSIS — Z79899 Other long term (current) drug therapy: Secondary | ICD-10-CM | POA: Insufficient documentation

## 2019-10-29 DIAGNOSIS — R7303 Prediabetes: Secondary | ICD-10-CM | POA: Insufficient documentation

## 2019-10-29 DIAGNOSIS — I10 Essential (primary) hypertension: Secondary | ICD-10-CM | POA: Insufficient documentation

## 2019-10-29 DIAGNOSIS — K429 Umbilical hernia without obstruction or gangrene: Secondary | ICD-10-CM | POA: Diagnosis not present

## 2019-10-29 DIAGNOSIS — E7800 Pure hypercholesterolemia, unspecified: Secondary | ICD-10-CM | POA: Diagnosis not present

## 2019-10-29 DIAGNOSIS — Z7984 Long term (current) use of oral hypoglycemic drugs: Secondary | ICD-10-CM | POA: Diagnosis not present

## 2019-10-29 DIAGNOSIS — F17210 Nicotine dependence, cigarettes, uncomplicated: Secondary | ICD-10-CM | POA: Insufficient documentation

## 2019-10-29 DIAGNOSIS — K219 Gastro-esophageal reflux disease without esophagitis: Secondary | ICD-10-CM | POA: Diagnosis not present

## 2019-10-29 DIAGNOSIS — E119 Type 2 diabetes mellitus without complications: Secondary | ICD-10-CM | POA: Diagnosis not present

## 2019-10-29 HISTORY — DX: Type 2 diabetes mellitus without complications: E11.9

## 2019-10-29 HISTORY — PX: UMBILICAL HERNIA REPAIR: SHX196

## 2019-10-29 HISTORY — DX: Palpitations: R00.2

## 2019-10-29 HISTORY — DX: Umbilical hernia without obstruction or gangrene: K42.9

## 2019-10-29 HISTORY — DX: Cardiac murmur, unspecified: R01.1

## 2019-10-29 LAB — POCT I-STAT, CHEM 8
BUN: 9 mg/dL (ref 6–20)
Calcium, Ion: 1.37 mmol/L (ref 1.15–1.40)
Chloride: 103 mmol/L (ref 98–111)
Creatinine, Ser: 0.7 mg/dL (ref 0.61–1.24)
Glucose, Bld: 108 mg/dL — ABNORMAL HIGH (ref 70–99)
HCT: 44 % (ref 39.0–52.0)
Hemoglobin: 15 g/dL (ref 13.0–17.0)
Potassium: 4.6 mmol/L (ref 3.5–5.1)
Sodium: 140 mmol/L (ref 135–145)
TCO2: 26 mmol/L (ref 22–32)

## 2019-10-29 LAB — GLUCOSE, CAPILLARY: Glucose-Capillary: 113 mg/dL — ABNORMAL HIGH (ref 70–99)

## 2019-10-29 SURGERY — REPAIR, HERNIA, UMBILICAL, ADULT
Anesthesia: General | Site: Abdomen

## 2019-10-29 MED ORDER — OXYCODONE HCL 5 MG PO TABS: 5.0000 mg | ORAL_TABLET | Freq: Three times a day (TID) | ORAL | 0 refills | Status: DC | PRN

## 2019-10-29 MED ORDER — ROCURONIUM BROMIDE 10 MG/ML (PF) SYRINGE
PREFILLED_SYRINGE | INTRAVENOUS | Status: AC
  Filled 2019-10-29: qty 10

## 2019-10-29 MED ORDER — ACETAMINOPHEN 325 MG PO TABS: 650.0000 mg | ORAL_TABLET | ORAL | Status: DC | PRN

## 2019-10-29 MED ORDER — CHLORHEXIDINE GLUCONATE 4 % EX LIQD: 60.0000 mL | Freq: Once | CUTANEOUS | Status: DC

## 2019-10-29 MED ORDER — OXYCODONE HCL 5 MG/5ML PO SOLN: 5.0000 mg | Freq: Once | ORAL | Status: AC | PRN

## 2019-10-29 MED ORDER — OXYCODONE HCL 5 MG PO TABS: 5.0000 mg | ORAL_TABLET | ORAL | Status: DC | PRN

## 2019-10-29 MED ORDER — LIDOCAINE 2% (20 MG/ML) 5 ML SYRINGE
INTRAMUSCULAR | Status: AC
  Filled 2019-10-29: qty 5

## 2019-10-29 MED ORDER — SODIUM CHLORIDE 0.9 % IV SOLN: 250.0000 mL | INTRAVENOUS | Status: DC | PRN

## 2019-10-29 MED ORDER — ROCURONIUM BROMIDE 10 MG/ML (PF) SYRINGE
PREFILLED_SYRINGE | INTRAVENOUS | Status: DC | PRN
  Administered 2019-10-29: 50 mg via INTRAVENOUS

## 2019-10-29 MED ORDER — DOCUSATE SODIUM 100 MG PO CAPS: 100.0000 mg | ORAL_CAPSULE | Freq: Two times a day (BID) | ORAL | 0 refills | Status: AC

## 2019-10-29 MED ORDER — ACETAMINOPHEN 325 MG RE SUPP: 650.0000 mg | RECTAL | Status: DC | PRN

## 2019-10-29 MED ORDER — GABAPENTIN 300 MG PO CAPS
ORAL_CAPSULE | ORAL | Status: AC
  Filled 2019-10-29: qty 1

## 2019-10-29 MED ORDER — SODIUM CHLORIDE 0.9% FLUSH: 3.0000 mL | Freq: Two times a day (BID) | INTRAVENOUS | Status: DC

## 2019-10-29 MED ORDER — FENTANYL CITRATE (PF) 100 MCG/2ML IJ SOLN
INTRAMUSCULAR | Status: DC | PRN
Start: 2019-10-29 — End: 2019-11-01
  Administered 2019-10-29: 100 ug via INTRAVENOUS

## 2019-10-29 MED ORDER — SODIUM CHLORIDE 0.9% FLUSH: 3.0000 mL | INTRAVENOUS | Status: DC | PRN

## 2019-10-29 MED ORDER — PROMETHAZINE HCL 25 MG/ML IJ SOLN: 6.2500 mg | INTRAMUSCULAR | Status: DC | PRN

## 2019-10-29 MED ORDER — BUPIVACAINE-EPINEPHRINE 0.25% -1:200000 IJ SOLN
INTRAMUSCULAR | Status: DC | PRN
  Administered 2019-10-29: 7 mL

## 2019-10-29 MED ORDER — HYDROMORPHONE HCL 1 MG/ML IJ SOLN: 0.2500 mg | INTRAMUSCULAR | Status: DC | PRN

## 2019-10-29 MED ORDER — PROPOFOL 10 MG/ML IV BOLUS
INTRAVENOUS | Status: DC | PRN
  Administered 2019-10-29: 150 mg via INTRAVENOUS

## 2019-10-29 MED ORDER — BUPIVACAINE LIPOSOME 1.3 % IJ SUSP: 20.0000 mL | Freq: Once | INTRAMUSCULAR | Status: DC

## 2019-10-29 MED ORDER — OXYCODONE HCL 5 MG PO TABS
ORAL_TABLET | ORAL | Status: AC
  Filled 2019-10-29: qty 1

## 2019-10-29 MED ORDER — DEXAMETHASONE SODIUM PHOSPHATE 10 MG/ML IJ SOLN
INTRAMUSCULAR | Status: DC | PRN
  Administered 2019-10-29: 10 mg via INTRAVENOUS

## 2019-10-29 MED ORDER — CEFAZOLIN SODIUM-DEXTROSE 2-4 GM/100ML-% IV SOLN
2.0000 g | INTRAVENOUS | Status: AC
  Administered 2019-10-29: 2 g via INTRAVENOUS

## 2019-10-29 MED ORDER — GABAPENTIN 300 MG PO CAPS
300.0000 mg | ORAL_CAPSULE | ORAL | Status: AC
  Administered 2019-10-29: 300 mg via ORAL

## 2019-10-29 MED ORDER — ACETAMINOPHEN 500 MG PO TABS
1000.0000 mg | ORAL_TABLET | ORAL | Status: AC
  Administered 2019-10-29: 1000 mg via ORAL

## 2019-10-29 MED ORDER — OXYCODONE HCL 5 MG PO TABS
5.0000 mg | ORAL_TABLET | Freq: Once | ORAL | Status: AC | PRN
  Administered 2019-10-29: 5 mg via ORAL

## 2019-10-29 MED ORDER — FENTANYL CITRATE (PF) 100 MCG/2ML IJ SOLN: 25.0000 ug | INTRAMUSCULAR | Status: DC | PRN

## 2019-10-29 MED ORDER — FENTANYL CITRATE (PF) 250 MCG/5ML IJ SOLN
INTRAMUSCULAR | Status: AC
Start: 2019-10-29 — End: ?
  Filled 2019-10-29: qty 5

## 2019-10-29 MED ORDER — MEPERIDINE HCL 25 MG/ML IJ SOLN: 6.2500 mg | INTRAMUSCULAR | Status: DC | PRN

## 2019-10-29 MED ORDER — ACETAMINOPHEN 500 MG PO TABS
ORAL_TABLET | ORAL | Status: AC
  Filled 2019-10-29: qty 2

## 2019-10-29 MED ORDER — LACTATED RINGERS IV SOLN
INTRAVENOUS | Status: DC
  Administered 2019-10-29: 1000 mL via INTRAVENOUS

## 2019-10-29 MED ORDER — MIDAZOLAM HCL 5 MG/5ML IJ SOLN
INTRAMUSCULAR | Status: DC | PRN
  Administered 2019-10-29 (×2): 1 mg via INTRAVENOUS

## 2019-10-29 MED ORDER — MIDAZOLAM HCL 2 MG/2ML IJ SOLN
INTRAMUSCULAR | Status: AC
  Filled 2019-10-29: qty 2

## 2019-10-29 MED ORDER — PROPOFOL 10 MG/ML IV BOLUS
INTRAVENOUS | Status: AC
  Filled 2019-10-29: qty 20

## 2019-10-29 MED ORDER — CEFAZOLIN SODIUM-DEXTROSE 2-4 GM/100ML-% IV SOLN
INTRAVENOUS | Status: AC
  Filled 2019-10-29: qty 100

## 2019-10-29 MED ORDER — LIDOCAINE 2% (20 MG/ML) 5 ML SYRINGE
INTRAMUSCULAR | Status: DC | PRN
  Administered 2019-10-29: 100 mg via INTRAVENOUS

## 2019-10-29 SURGICAL SUPPLY — 51 items
ADH SKN CLS APL DERMABOND .7 (GAUZE/BANDAGES/DRESSINGS)
APL PRP STRL LF DISP 70% ISPRP (MISCELLANEOUS) ×1
APL SKNCLS STERI-STRIP NONHPOA (GAUZE/BANDAGES/DRESSINGS) ×1
BALL CTTN LRG ABS STRL LF (GAUZE/BANDAGES/DRESSINGS) ×1
BENZOIN TINCTURE PRP APPL 2/3 (GAUZE/BANDAGES/DRESSINGS) ×1 IMPLANT
BLADE CLIPPER SENSICLIP SURGIC (BLADE) ×1 IMPLANT
BLADE HEX COATED 2.75 (ELECTRODE) ×2 IMPLANT
BLADE SURG 15 STRL LF DISP TIS (BLADE) ×1 IMPLANT
BLADE SURG 15 STRL SS (BLADE) ×2
CHLORAPREP W/TINT 26 (MISCELLANEOUS) ×2 IMPLANT
COTTONBALL LRG STERILE PKG (GAUZE/BANDAGES/DRESSINGS) ×2 IMPLANT
COVER BACK TABLE 60X90IN (DRAPES) ×2 IMPLANT
COVER MAYO STAND STRL (DRAPES) ×2 IMPLANT
COVER WAND RF STERILE (DRAPES) ×2 IMPLANT
DECANTER SPIKE VIAL GLASS SM (MISCELLANEOUS) IMPLANT
DERMABOND ADVANCED (GAUZE/BANDAGES/DRESSINGS)
DERMABOND ADVANCED .7 DNX12 (GAUZE/BANDAGES/DRESSINGS) ×1 IMPLANT
DRAPE LAPAROTOMY 100X72 PEDS (DRAPES) ×2 IMPLANT
DRAPE UTILITY XL STRL (DRAPES) ×2 IMPLANT
DRSG TEGADERM 4X4.75 (GAUZE/BANDAGES/DRESSINGS) ×1 IMPLANT
ELECT REM PT RETURN 9FT ADLT (ELECTROSURGICAL) ×2
ELECTRODE REM PT RTRN 9FT ADLT (ELECTROSURGICAL) ×1 IMPLANT
GAUZE SPONGE 4X4 12PLY STRL (GAUZE/BANDAGES/DRESSINGS) ×1 IMPLANT
GLOVE BIO SURGEON STRL SZ 6 (GLOVE) ×2 IMPLANT
GLOVE BIOGEL PI IND STRL 6.5 (GLOVE) ×1 IMPLANT
GLOVE BIOGEL PI IND STRL 7.5 (GLOVE) IMPLANT
GLOVE BIOGEL PI INDICATOR 6.5 (GLOVE) ×1
GLOVE BIOGEL PI INDICATOR 7.5 (GLOVE) ×5
GLOVE ECLIPSE 7.5 STRL STRAW (GLOVE) ×1 IMPLANT
GOWN STRL REUS W/TWL LRG LVL3 (GOWN DISPOSABLE) ×4 IMPLANT
MESH VENTRALEX ST 1-7/10 CRC S (Mesh General) ×1 IMPLANT
NEEDLE HYPO 22GX1.5 SAFETY (NEEDLE) ×2 IMPLANT
NS IRRIG 500ML POUR BTL (IV SOLUTION) IMPLANT
PACK BASIN DAY SURGERY FS (CUSTOM PROCEDURE TRAY) ×2 IMPLANT
PENCIL SMOKE EVACUATOR (MISCELLANEOUS) ×2 IMPLANT
SLEEVE SCD COMPRESS KNEE MED (MISCELLANEOUS) ×2 IMPLANT
STRIP CLOSURE SKIN 1/2X4 (GAUZE/BANDAGES/DRESSINGS) ×1 IMPLANT
SUT ETHIBOND 0 MO6 C/R (SUTURE) ×1 IMPLANT
SUT MNCRL AB 4-0 PS2 18 (SUTURE) ×2 IMPLANT
SUT VIC AB 0 SH 27 (SUTURE) IMPLANT
SUT VIC AB 2-0 SH 27 (SUTURE)
SUT VIC AB 2-0 SH 27XBRD (SUTURE) IMPLANT
SUT VIC AB 3-0 SH 27 (SUTURE) ×2
SUT VIC AB 3-0 SH 27X BRD (SUTURE) ×1 IMPLANT
SUT VICRYL 3 0 BR 18  UND (SUTURE)
SUT VICRYL 3 0 BR 18 UND (SUTURE) IMPLANT
SYR BULB IRRIG 60ML STRL (SYRINGE) IMPLANT
SYR CONTROL 10ML LL (SYRINGE) ×2 IMPLANT
TOWEL OR 17X26 10 PK STRL BLUE (TOWEL DISPOSABLE) ×2 IMPLANT
TUBE CONNECTING 12X1/4 (SUCTIONS) ×2 IMPLANT
YANKAUER SUCT BULB TIP NO VENT (SUCTIONS) ×2 IMPLANT

## 2019-10-29 NOTE — Anesthesia Procedure Notes (Signed)
Procedure Name: Intubation Date/Time: 10/29/2019 1:36 PM Performed by: Rogers Blocker, CRNA Pre-anesthesia Checklist: Patient identified, Emergency Drugs available, Suction available and Patient being monitored Patient Re-evaluated:Patient Re-evaluated prior to induction Oxygen Delivery Method: Circle System Utilized Preoxygenation: Pre-oxygenation with 100% oxygen Induction Type: IV induction Ventilation: Mask ventilation without difficulty Laryngoscope Size: Mac and 4 Grade View: Grade I Tube type: Oral Number of attempts: 1 Airway Equipment and Method: Stylet and Oral airway Placement Confirmation: ETT inserted through vocal cords under direct vision,  positive ETCO2 and breath sounds checked- equal and bilateral Secured at: 21 cm Tube secured with: Tape Dental Injury: Teeth and Oropharynx as per pre-operative assessment

## 2019-10-29 NOTE — H&P (Signed)
Surgical Evaluation  HPI: very pleasant 59 year old man with chief complaint umbilical hernia.  This has been present for many years, he is evaluated by his primary care doctors over the years and has always been told that if it wasn't bothering him to leave it alone.  For the last couple years it has begun to bother him due to increasing size and occasional discomfort in the area.  Denies GI symptoms.  He does have a history of pyloromyotomy as an infant via a transverse upper abdominal incision, and had a hernia repair that he does not know what kind of hernia it was, this was done before he was-year-old.  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 He does smoke a pack and a half of cigarettes per day he has tried to quit many times.  He is willing to cut down for surgery.  No Known Allergies      Past Medical History:  Diagnosis Date  . Anxiety   . Borderline diabetes   . GERD (gastroesophageal reflux disease)   . Hypercholesterolemia   . Hypertension   . S/P colonoscopy August 2011   Dr. Arnoldo Morale: Moderate diverticulosis throughout colon, otherwise normal          Past Surgical History:  Procedure Laterality Date  . HERNIA REPAIR     as baby  . pyloric stenosis repair     as baby, operated at [facility] History  Problem Relation Age of Onset  . Colon cancer Neg Hx     Social History        Socioeconomic History  . Marital status: Married    Spouse name: Not on file  . Number of children: 2  . Years of education: Not on file  . Highest education level: Not on file  Occupational History  . 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[employer]    Comment: [employer]  Tobacco Use  . Smoking status: Current Every Day Smoker    Packs/day: 1.50    Years: 30.00    Pack years: 45.00    Types: Cigarettes  . Smokeless tobacco: Never Used  Substance and  Sexual Activity  . Alcohol use: Yes    Comment: a few beers in the evening  . Drug use: Not on file  . Sexual activity: Not on file  Other Topics Concern  . Not on file  Social History Narrative  . Not on file   Social Determinants of Health      Financial Resource Strain:   . Difficulty of Paying Living Expenses:   Food Insecurity:   . Worried About Charity fundraiser in the Last Year:   . Arboriculturist in the Last Year:   Transportation Needs:   . Film/video editor (Medical):   Marland Kitchen Lack of Transportation (Non-Medical):   Physical Activity:   . Days of Exercise per Week:   . Minutes of Exercise per Session:   Stress:   . Feeling of Stress :   Social Connections:   . Frequency of Communication with Friends and Family:   . Frequency of Social Gatherings with Friends and Family:   . Attends Religious Services:   . Active Member of Clubs or Organizations:   . Attends Archivist Meetings:   Marland Kitchen Marital Status:           Current  Outpatient Medications on File Prior to Visit  Medication Sig Dispense Refill  . acetaminophen (TYLENOL) 500 MG tablet Take 1,000 mg by mouth every 6 (six) hours as needed. Pain     . ALPRAZolam (XANAX) 1 MG tablet Take 1 mg by mouth daily as needed. Anxiety    . amLODipine (NORVASC) 5 MG tablet Take 5 mg by mouth daily.    . fenofibrate 160 MG tablet Take 160 mg by mouth daily.      Marland Kitchen lisinopril (ZESTRIL) 20 MG tablet Take 20 mg by mouth 2 (two) times daily.    . metFORMIN (GLUCOPHAGE) 500 MG tablet Take 500 mg by mouth daily with breakfast.      . simvastatin (ZOCOR) 20 MG tablet Take 20 mg by mouth daily.     No current facility-administered medications on file prior to visit.    Review of Systems: a complete, 10pt review of systems was completed with pertinent positives and negatives as documented in the HPI  Physical Exam: Vitals  Weight: 129.6 lb Height: 62in Body Surface Area: 1.59 m Body  Mass Index: 23.7 kg/m  Temp.: 98.84F(Temporal)  Pulse: 99 (Regular)  P.OX: 97% (Room air) BP: 162/80(Sitting, Left Arm, Standard)  Alert and well-appearing. Unlabored respirations. Abdomen is soft, nontender nondistended. There is a reducible hernia most prominent just superior to the umbilicus, when protruding it is about a centimeters in diameter. This is situated within a approximately 4 cm wide diastasis recti.   CBC Latest Ref Rng & Units 09/11/2010  WBC 4.0 - 10.5 K/uL 9.1  Hemoglobin 13.0 - 17.0 g/dL 12.8(L)  Hematocrit 39 - 52 % 39.0  Platelets 150 - 400 K/uL 432(H)    CMP Latest Ref Rng & Units 09/11/2010  Total Protein 6.0 - 8.3 g/dL 7.3  Total Bilirubin 0.3 - 1.2 mg/dL 0.5  Alkaline Phos 39 - 117 U/L 27(L)  AST 0 - 37 U/L 26  ALT 0 - 53 U/L 35    Recent Labs  No results found for: INR, PROTIME    Imaging: Imaging Results (Last 48 hours)  No results found.     A/P: UMBILICAL HERNIA (L84.5) Story: Reducible, but increasing in size and increasingly symptomatic. Discussed options for repair and I would recommend an open approach with mesh. We discussed the procedure and risks of bleeding, infection, pain, scarring, injury to intra-abdominal structures, wound infection or poor healing, and hernia recurrence, the latter 2 being higher risk for this man who is an active smoker. Also discussed systemic risks of general anesthesia including cardiovascular, pulmonary, embolic complications. He is trying to quit, and we'll try to cut down significantly before surgery. Questions were welcomed and answered to his satisfaction, he would like to proceed with scheduling.   DIASTASIS RECTI (M62.08) Story: Discussed that this will not change after surgery, and going forward physical therapy/core strengthening exercises may be helpful to prevent progression        Patient Active Problem List   Diagnosis Date Noted  . Epigastric pain 09/11/2010        Romana Juniper, MD [facility], [place]

## 2019-10-29 NOTE — Anesthesia Preprocedure Evaluation (Signed)
Anesthesia Evaluation  Patient identified by MRN, date of birth, ID band Patient awake    Reviewed: Allergy & Precautions, NPO status , Patient's Chart, lab work & pertinent test results  Airway Mallampati: II  TM Distance: >3 FB Neck ROM: Full    Dental  (+) Dental Advisory Given, Partial Lower   Pulmonary neg pulmonary ROS, Current Smoker and Patient abstained from smoking.,    Pulmonary exam normal breath sounds clear to auscultation       Cardiovascular hypertension, Pt. on medications Normal cardiovascular exam Rhythm:Regular Rate:Normal  Echo 08/2019 1. Left ventricular ejection fraction, by estimation, is 60 to 65%. The left ventricle has normal function. The left ventricle has no regional wall motion abnormalities. There is mild left ventricular hypertrophy. Left ventricular diastolic parameters are consistent with Grade I diastolic dysfunction (impaired relaxation).  2. Right ventricular systolic function is normal. The right ventricular size is normal.  3. The mitral valve is normal in structure. No evidence of mitral valve regurgitation. No evidence of mitral stenosis.  4. The aortic valve is tricuspid. Aortic valve regurgitation is not visualized. No aortic stenosis is present.  5. The inferior vena cava is normal in size with greater than 50% respiratory variability, suggesting right atrial pressure of 3 mmHg.    Neuro/Psych PSYCHIATRIC DISORDERS Anxiety negative neurological ROS     GI/Hepatic Neg liver ROS, GERD  ,  Endo/Other  diabetes  Renal/GU negative Renal ROS     Musculoskeletal negative musculoskeletal ROS (+)   Abdominal   Peds  Hematology negative hematology ROS (+)   Anesthesia Other Findings   Reproductive/Obstetrics                             Anesthesia Physical Anesthesia Plan  ASA: II  Anesthesia Plan: General   Post-op Pain Management:    Induction:  Intravenous  PONV Risk Score and Plan: 3 and Ondansetron, Dexamethasone, Treatment may vary due to age or medical condition and Midazolam  Airway Management Planned: Oral ETT  Additional Equipment: None  Intra-op Plan:   Post-operative Plan: Extubation in OR  Informed Consent: I have reviewed the patients History and Physical, chart, labs and discussed the procedure including the risks, benefits and alternatives for the proposed anesthesia with the patient or authorized representative who has indicated his/her understanding and acceptance.     Dental advisory given  Plan Discussed with: CRNA  Anesthesia Plan Comments:         Anesthesia Quick Evaluation

## 2019-10-29 NOTE — Interval H&P Note (Signed)
History and Physical Interval Note:  10/29/2019 12:07 PM  Kyle Giles  has presented today for surgery, with the diagnosis of UMBILICAL HERNIA.  The various methods of treatment have been discussed with the patient and family. After consideration of risks, benefits and other options for treatment, the patient has consented to  Procedure(s): OPEN UMBILICAL HERNIA REPAIR WITH MESH (N/A) as a surgical intervention.  The patient's history has been reviewed, patient examined, no change in status, stable for surgery.  I have reviewed the patient's chart and labs.  Questions were answered to the patient's satisfaction.     Lashika Erker Rich Brave

## 2019-10-29 NOTE — Op Note (Signed)
DATE OF PROCEDURE: 10/29/2019  PREOPERATIVE DIAGNOSIS: Umbilical hernia   POSTOPERATIVE DIAGNOSIS: Umbilical hernia   PROCEDURE PERFORMED: Open umbilical hernia repair with mesh   SURGEONS: Romana Juniper, MD Daiva Huge, MD (resident)   ANESTHESIA: General   INDICATIONS: The patient is a 59 y.o. Male  symptomatic umbilical hernia   PROCEDURE IN DETAIL:   The patient was brought into the operative suite. Anesthesia was administered and pre-operative antibiotics were given.The patient was then placed in supine. The abdomen was prepped and draped in the usual sterile fashion.  Next the infraumbilical skin was anesthetized with Exparel:Marcaine. A semilunar infraumbilical incision was made. Cautery and blunt dissection was used to dissect down to the fascia. The hernia sac was dissected free from surrounding tissues in 360 degrees. The umbilical skin was dissected free of the hernia sac with cautery. There was a fat containing umbilical hernia present. The contents of the hernia sac were reduced. The hernia defect was 2x2 cm in diameter. The hernia sac was removed. Due to the size of the hernia, a 4 cm ventralex mesh was placed as an underlay using 0 Ethibond in 4 directions to anchor the mesh. The fascial defect was then primarily closed with interrupted 0 Ethibond sutures. The umbilical skin was sutured to the fascia with a 3-0 vicryl. The skin was closed with a 4-0 monocryl subcuticular suture. Steri strips were placed over incision. A cotton ball was placed over umbilical area and covered with 4x4 gauze and Tegaderm. The patient awoke from anesthesia and was brought to pacu in stable condition. All counts were correct.   ESTIMATED BLOOD LOSS: Minimal  SPECIMEN: None  COMPLICATIONS: None

## 2019-10-29 NOTE — Discharge Instructions (Signed)
Post Anesthesia Home Care Instructions  Activity: Get plenty of rest for the remainder of the day. A responsible adult should stay with you for 24 hours following the procedure.  For the next 24 hours, DO NOT: -Drive a car -Paediatric nurse -Drink alcoholic beverages -Take any medication unless instructed by your physician -Make any legal decisions or sign important papers.  Meals: Start with liquid foods such as gelatin or soup. Progress to regular foods as tolerated. Avoid greasy, spicy, heavy foods. If nausea and/or vomiting occur, drink only clear liquids until the nausea and/or vomiting subsides. Call your physician if vomiting continues.  Special Instructions/Symptoms: Your throat may feel dry or sore from the anesthesia or the breathing tube placed in your throat during surgery. If this causes discomfort, gargle with warm salt water. The discomfort should disappear within 24 hours.  If you had a scopolamine patch placed behind your ear for the management of post- operative nausea and/or vomiting:  1. The medication in the patch is effective for 72 hours, after which it should be removed.  Wrap patch in a tissue and discard in the trash. Wash hands thoroughly with soap and water. 2. You may remove the patch earlier than 72 hours if you experience unpleasant side effects which may include dry mouth, dizziness or visual disturbances. 3. Avoid touching the patch. Wash your hands with soap and water after contact with the patch.   HERNIA REPAIR: POST OP INSTRUCTIONS   EAT Gradually transition to a high fiber diet with a fiber supplement over the next few weeks after discharge.  Start with a pureed / full liquid diet (see below)  WALK Walk an hour a day (cumulative- not all at once).  Control your pain to do that.    CONTROL PAIN Control pain so that you can walk, sleep, tolerate sneezing/coughing, and go up/down stairs.  HAVE A BOWEL MOVEMENT DAILY Keep your bowels regular to  avoid problems.  OK to try a laxative to override constipation.  OK to use an antidairrheal to slow down diarrhea.  Call if not better after 2 tries  CALL IF YOU HAVE PROBLEMS/CONCERNS Call if you are still struggling despite following these instructions. Call if you have concerns not answered by these instructions  ######################################################################    1. DIET: Follow a light bland diet & liquids the first 24 hours after arrival home, such as soup, liquids, starches, etc.  Be sure to drink plenty of fluids.  Quickly advance to a usual solid diet within a few days.  Avoid fast food or heavy meals as your are more likely to get nauseated or have irregular bowels.  A low-sugar, high-fiber diet for the rest of your life is ideal.   2. Take your usually prescribed home medications unless otherwise directed.  3. PAIN CONTROL: a. Pain is best controlled by a usual combination of three different methods TOGETHER: i. Ice/Heat ii. Over the counter pain medication iii. Prescription pain medication b. Most patients will experience some swelling and bruising around the hernia(s) such as the bellybutton, groins, or old incisions.  Ice packs or heating pads (30-60 minutes up to 6 times a day) will help. Use ice for the first few days to help decrease swelling and bruising, then switch to heat to help relax tight/sore spots and speed recovery.  Some people prefer to use ice alone, heat alone, alternating between ice & heat.  Experiment to what works for you.  Swelling and bruising can take several weeks to resolve.  c. It is helpful to take an over-the-counter pain medication regularly for the first days: i. Naproxen (Aleve, etc)  Two 2105m tabs twice a day OR Ibuprofen (Advil, etc) Three 208mtabs four times a day (every meal & bedtime) AND ii. Acetaminophen (Tylenol, etc) 325-65041mour times a day (every meal & bedtime) d. A  prescription for pain medication should be  given to you upon discharge.  Take your pain medication as prescribed, IF NEEDED.  i. If you are having problems/concerns with the prescription medicine (does not control pain, nausea, vomiting, rash, itching, etc), please call us [phone] see if we need to switch you to a different pain medicine that will work better for you and/or control your side effect better. ii. If you need a refill on your pain medication, please contact your pharmacy.  They will contact our office to request authorization. Prescriptions will not be filled after 5 pm or on week-ends.  4. Avoid getting constipated.  Between the surgery and the pain medications, it is common to experience some constipation.  Increasing fluid intake and taking a fiber supplement (such as Metamucil, Citrucel, FiberCon, MiraLax, etc) 1-2 times a day regularly will usually help prevent this problem from occurring.  A mild laxative (prune juice, Milk of Magnesia, MiraLax, etc) should be taken according to package directions if there are no bowel movements after 48 hours.    5. Wash / shower every day, starting 2 days after surgery.  You may shower over the steri strips which are waterproof.    6. Remove your outer bandage 2 days after surgery.  You may leave the incision open to air.  You may replace a dressing/Band-Aid to cover an incision for comfort if you wish.  Continue to shower over incision(s) after the dressing is off.  7. ACTIVITIES as tolerated:   a. You may resume regular (light) daily activities beginning the next day--such as daily self-care, walking, climbing stairs--gradually increasing activities as tolerated.  Control your pain so that you can walk an hour a day.  If you can walk 30 minutes without difficulty, it is safe to try more intense activity such as jogging, treadmill, bicycling, low-impact aerobics, swimming, etc. b. Refrain from the most intensive and strenuous activity such as sit-ups, heavy lifting, contact sports,  etc  Refrain from any heavy lifting or straining until 6 weeks after surgery.   c. DO NOT PUSH THROUGH PAIN.  Let pain be your guide: If it hurts to do something, don't do it.  Pain is your body warning you to avoid that activity for another week until the pain goes down. d. You may drive when you are no longer taking prescription pain medication, you can comfortably wear a seatbelt, and you can safely maneuver your car and apply brakes. e. YouDennis Basty have sexual intercourse when it is comfortable.   8. FOLLOW UP in our office a. Please call [facility] at [phone] to set up an appointment to see your surgeon in the office for a follow-up appointment approximately 2-3 weeks after your surgery. b. Make sure that you call for this appointment the day you arrive home to insure a convenient appointment time.  9.  If you have disability of FMLA / Family leave forms, please bring the forms to the office for processing.  (do not give to your surgeon).  WHEN TO CALL US [phone]. Poor pain control 2. Reactions / problems with new medications (rash/itching, nausea, etc)  3.  Fever over 101.5 F (38.5 C) 4. Inability to urinate 5. Nausea and/or vomiting 6. Worsening swelling or bruising 7. Continued bleeding from incision. 8. Increased pain, redness, or drainage from the incision   The clinic staff is available to answer your questions during regular business hours (8:30am-5pm).  Please don't hesitate to call and ask to speak to one of our nurses for clinical concerns.   If you have a medical emergency, go to the nearest emergency room or call 911.  A surgeon from [facility] is always on call at the hospitals in [facility], [place], [place], [place], [place]  [address] ?  [address] [address], [place], [place]   [address] MAIN: [phone] ? TOLL FREE: [phone] ? FAX: [phone] [email or web address]

## 2019-10-31 NOTE — Anesthesia Postprocedure Evaluation (Signed)
Anesthesia Post Note  Patient: Kyle Giles  Procedure(s) Performed: OPEN UMBILICAL HERNIA REPAIR WITH MESH (N/A Abdomen)     Anesthesia Post Evaluation No complications documented.  Last Vitals:  Vitals:   10/29/19 1603 10/29/19 1645  BP:  (!) 161/84  Pulse: 65 78  Resp: 15   Temp:  (!) 36.3 C  SpO2: 97%     Last Pain:  Vitals:   10/29/19 1645  TempSrc:   PainSc: [place]

## 2019-11-01 ENCOUNTER — Encounter (HOSPITAL_BASED_OUTPATIENT_CLINIC_OR_DEPARTMENT_OTHER): Payer: Self-pay | Admitting: Surgery

## 2019-11-01 NOTE — Transfer of Care (Signed)
Immediate Anesthesia Transfer of Care Note  Patient: AMARO MANGOLD  Procedure(s) Performed: OPEN UMBILICAL HERNIA REPAIR WITH MESH (N/A Abdomen)  Patient Location: PACU  Anesthesia Type:General  Level of Consciousness: sedated and patient cooperative  Airway & Oxygen Therapy: Patient Spontanous Breathing  Post-op Assessment: Report given to RN, Post -op Vital signs reviewed and stable and Patient moving all extremities  Post vital signs: Reviewed  Last Vitals:  Vitals Value Taken Time  BP 161/84 10/29/19 1645  Temp 36.3 C 10/29/19 1645  Pulse 78 10/29/19 1645  Resp 14 10/29/19 1604  SpO2 97 % 10/29/19 1604  Vitals shown include unvalidated device data.  Last Pain:  Vitals:   11/01/19 1029  TempSrc:   PainSc: 4       Patients Stated Pain Goal: 5 (89/84/21 0312)  Complications: No complications documented.

## 2019-11-02 ENCOUNTER — Ambulatory Visit: Payer: BC Managed Care – PPO | Admitting: Cardiovascular Disease

## 2019-11-05 DIAGNOSIS — E785 Hyperlipidemia, unspecified: Secondary | ICD-10-CM | POA: Diagnosis not present

## 2019-11-05 DIAGNOSIS — I10 Essential (primary) hypertension: Secondary | ICD-10-CM | POA: Diagnosis not present

## 2019-11-05 DIAGNOSIS — F419 Anxiety disorder, unspecified: Secondary | ICD-10-CM | POA: Diagnosis not present

## 2019-11-05 DIAGNOSIS — Z23 Encounter for immunization: Secondary | ICD-10-CM | POA: Diagnosis not present

## 2019-11-05 DIAGNOSIS — F17200 Nicotine dependence, unspecified, uncomplicated: Secondary | ICD-10-CM | POA: Diagnosis not present

## 2019-11-05 DIAGNOSIS — K429 Umbilical hernia without obstruction or gangrene: Secondary | ICD-10-CM | POA: Diagnosis not present

## 2019-11-05 DIAGNOSIS — E7849 Other hyperlipidemia: Secondary | ICD-10-CM | POA: Diagnosis not present

## 2019-11-05 DIAGNOSIS — Z6824 Body mass index (BMI) 24.0-24.9, adult: Secondary | ICD-10-CM | POA: Diagnosis not present

## 2019-11-05 DIAGNOSIS — R7309 Other abnormal glucose: Secondary | ICD-10-CM | POA: Diagnosis not present

## 2019-11-29 ENCOUNTER — Telehealth: Payer: Self-pay | Admitting: General Surgery

## 2019-11-29 NOTE — Telephone Encounter (Signed)
Pt called in an attempt to schedule his 10 yr colonoscopy follow up / recheck.  Plz call back when Dr. Arnoldo Morale' availability has been confirmed.  Thank you

## 2019-12-14 ENCOUNTER — Other Ambulatory Visit: Payer: Self-pay

## 2019-12-14 ENCOUNTER — Encounter: Payer: Self-pay | Admitting: General Surgery

## 2019-12-14 ENCOUNTER — Ambulatory Visit: Payer: BC Managed Care – PPO | Admitting: General Surgery

## 2019-12-14 VITALS — BP 145/83 | HR 80 | Temp 98.1°F | Resp 12 | Ht 62.0 in | Wt 129.0 lb

## 2019-12-14 DIAGNOSIS — Z1211 Encounter for screening for malignant neoplasm of colon: Secondary | ICD-10-CM | POA: Diagnosis not present

## 2019-12-14 MED ORDER — PEG 3350-KCL-NA BICARB-NACL 420 G PO SOLR: 4000.0000 mL | Freq: Once | ORAL | 0 refills | Status: AC

## 2019-12-14 NOTE — Patient Instructions (Signed)
Colonoscopy, Adult A colonoscopy is a procedure to look at the entire large intestine. This procedure is done using a long, thin, flexible tube that has a camera on the end. You may have a colonoscopy:  As a part of normal colorectal screening.  If you have certain symptoms, such as: ? A low number of red blood cells in your blood (anemia). ? Diarrhea that does not go away. ? Pain in your abdomen. ? Blood in your stool. A colonoscopy can help screen for and diagnose medical problems, including:  Tumors.  Extra tissue that grows where mucus forms (polyps).  Inflammation.  Areas of bleeding. Tell your health care provider about:  Any allergies you have.  All medicines you are taking, including vitamins, herbs, eye drops, creams, and over-the-counter medicines.  Any problems you or family members have had with anesthetic medicines.  Any blood disorders you have.  Any surgeries you have had.  Any medical conditions you have.  Any problems you have had with having bowel movements.  Whether you are pregnant or may be pregnant. What are the risks? Generally, this is a safe procedure. However, problems may occur, including:  Bleeding.  Damage to your intestine.  Allergic reactions to medicines given during the procedure.  Infection. This is rare. What happens before the procedure? Eating and drinking restrictions Follow instructions from your health care provider about eating or drinking restrictions, which may include:  A few days before the procedure: ? Follow a low-fiber diet. ? Avoid nuts, seeds, dried fruit, raw fruits, and vegetables.  1 day before the procedure: ? Eat only gelatin dessert or ice pops. ? Drink only clear liquids, such as water, clear juice, clear broth or bouillon, black coffee or tea, or clear soft drinks or sports drinks. ? Avoid liquids that contain red or purple dye.  The day of the procedure: Do not eat solid foods. Bowel prep If you  were prescribed a bowel prep to take by mouth (orally) to clean out your colon:  Take it as told by your health care provider. Starting the day before your procedure, you will need to drink a large amount of liquid medicine. The liquid will cause you to have many bowel movements of loose stool until your stool becomes almost clear or light green.  If your skin or the opening between the buttocks (anus) gets irritated from diarrhea, you may relieve the irritation using: ? Wipes with medicine in them, such as adult wet wipes with aloe and vitamin E. ? A product to soothe skin, such as petroleum jelly.  If you vomit while drinking the bowel prep: ? Take a break for up to 60 minutes. ? Begin the bowel prep again. ? Call your health care provider if you keep vomiting or you cannot take the bowel prep without vomiting.  To clean out your colon, you may also be given: ? Laxative medicines. These help you have a bowel movement. ? Instructions for enema use. An enema is liquid medicine injected into your rectum. Medicines Ask your health care provider about:  Changing or stopping your regular medicines or supplements. This is especially important if you are taking iron supplements, diabetes medicines, or blood thinners.  Taking medicines such as aspirin and ibuprofen. These medicines can thin your blood. Do not take these medicines unless your health care provider tells you to take them.  Taking over-the-counter medicines, vitamins, herbs, and supplements. General instructions  Ask your health care provider what steps will be taken  to help prevent infection. These may include washing skin with a germ-killing soap.  Plan to have someone take you home from the hospital or clinic. What happens during the procedure?   An IV will be inserted into one of your veins.  You may be given one or more of the following: ? A medicine to help you relax (sedative). ? A medicine to numb the area (local  anesthetic). ? A medicine to make you fall asleep (general anesthetic). This is rarely needed.  You will lie on your side with your knees bent.  The tube will: ? Have oil or gel put on it (be lubricated). ? Be inserted into your anus. ? Be gently eased through all parts of your large intestine.  Air will be sent into your colon to keep it open. This may cause some pressure or cramping.  Images will be taken with the camera and will appear on a screen.  A small tissue sample may be removed to be looked at under a microscope (biopsy). The tissue may be sent to a lab for testing if any signs of problems are found.  If small polyps are found, they may be removed and checked for cancer cells.  When the procedure is finished, the tube will be removed. The procedure may vary among health care providers and hospitals. What happens after the procedure?  Your blood pressure, heart rate, breathing rate, and blood oxygen level will be monitored until you leave the hospital or clinic.  You may have a small amount of blood in your stool.  You may pass gas and have mild cramping or bloating in your abdomen. This is caused by the air that was used to open your colon during the exam.  Do not drive for 24 hours after the procedure.  It is up to you to get the results of your procedure. Ask your health care provider, or the department that is doing the procedure, when your results will be ready. Summary  A colonoscopy is a procedure to look at the entire large intestine.  Follow instructions from your health care provider about eating and drinking before the procedure.  If you were prescribed an oral bowel prep to clean out your colon, take it as told by your health care provider.  During the colonoscopy, a flexible tube with a camera on its end is inserted into the anus and then passed into the other parts of the large intestine. This information is not intended to replace advice given to you  by your health care provider. Make sure you discuss any questions you have with your health care provider. Document Revised: 08/14/2018 Document Reviewed: 08/14/2018 Elsevier Patient Education  [place].

## 2019-12-15 NOTE — Progress Notes (Signed)
Kyle Giles; [id number]; 08-Nov-1960   HPI Patient is a 59 year old white male who was referred to my care by Dr. Hilma Giles for a screening colonoscopy.  He last had a colonoscopy 10 years ago.  He denies any blood per rectum, abnormal weight loss, abnormal diarrhea or constipation, or family history of colon cancer.  He was diagnosed in the past with diverticulosis on previous colonoscopy. Past Medical History:  Diagnosis Date  . Anxiety   . DM type 2 (diabetes mellitus, type 2) ([place])   . GERD (gastroesophageal reflux disease)   . Heart murmur    saw dr Kyle Giles 06-29-2019 echo done   . Hypercholesterolemia   . Hypertension   . Palpitations    none in a long time as of 10-19-2019  . S/P colonoscopy August 2011   Dr. Arnoldo Giles: Moderate diverticulosis throughout colon, otherwise normal   . Umbilical hernia     Past Surgical History:  Procedure Laterality Date  . cyst removed from finger  age 26  . HERNIA REPAIR     as baby  . pyloric stenosis repair     as baby, operated at [facility]  . UMBILICAL HERNIA REPAIR N/A 10/29/2019   Procedure: OPEN UMBILICAL HERNIA REPAIR WITH MESH;  Surgeon: Kyle Riley, MD;  Location: [facility];  Service: General;  Laterality: N/A;    Family History  Problem Relation Age of Onset  . Colon cancer Neg Hx     Current Outpatient Medications on File Prior to Visit  Medication Sig Dispense Refill  . acetaminophen (TYLENOL) 500 MG tablet Take 1,000 mg by mouth every 6 (six) hours as needed. Pain     . ALPRAZolam (XANAX) 1 MG tablet Take 1 mg by mouth daily as needed. Anxiety    . amLODipine (NORVASC) 5 MG tablet Take 5 mg by mouth. Takes 10 mg at hs    . fenofibrate 160 MG tablet Take 160 mg by mouth daily.      . metFORMIN (GLUCOPHAGE) 500 MG tablet Take 500 mg by mouth daily with breakfast. Takes 1 in am, 2 tabs with supper in pm    . olmesartan (BENICAR) 40 MG tablet Take 40 mg by mouth daily.    Marland Kitchen oxyCODONE (ROXICODONE) 5 MG  immediate release tablet Take 1 tablet (5 mg total) by mouth every 8 (eight) hours as needed. Alternate tylenol and ibuprofen for the first few days. Take narcotic pain medication only if needed for severe/ breakthrough pain. 10 tablet 0  . simvastatin (ZOCOR) 20 MG tablet Take 20 mg by mouth at bedtime.      No current facility-administered medications on file prior to visit.    No Known Allergies  Social History   Substance and Sexual Activity  Alcohol Use Yes   Comment: a few beers in the evening    Social History   Tobacco Use  Smoking Status Current Every Day Smoker  . Packs/day: 1.50  . Years: 30.00  . Pack years: 45.00  . Types: Cigarettes  Smokeless Tobacco Never Used  Tobacco Comment   1 ppd since 3 weeks as of 10-19-2019    Review of Systems  Constitutional: Negative.   HENT: Negative.   Eyes: Negative.   Respiratory: Negative.   Cardiovascular: Negative.   Gastrointestinal: Negative.   Genitourinary: Negative.   Musculoskeletal: Negative.   Skin: Negative.   Neurological: Negative.   Endo/Heme/Allergies: Negative.   Psychiatric/Behavioral: Negative.     Objective   Vitals:   12/14/19  1429  BP: (!) 145/83  Pulse: 80  Resp: 12  Temp: 98.1 F (36.7 C)  SpO2: 94%    Physical Exam Vitals reviewed.  Constitutional:      Appearance: Normal appearance. He is normal weight. He is not ill-appearing.  HENT:     Head: Normocephalic and atraumatic.  Cardiovascular:     Rate and Rhythm: Normal rate and regular rhythm.     Heart sounds: Normal heart sounds. No murmur heard.  No friction rub. No gallop.   Pulmonary:     Effort: Pulmonary effort is normal. No respiratory distress.     Breath sounds: Normal breath sounds. No stridor. No wheezing, rhonchi or rales.  Abdominal:     General: Bowel sounds are normal. There is no distension.     Palpations: Abdomen is soft. There is no mass.     Tenderness: There is no abdominal tenderness. There is no  guarding or rebound.     Hernia: No hernia is present.  Skin:    General: Skin is warm and dry.  Neurological:     Mental Status: He is alert and oriented to person, place, and time.     Assessment  Need for screening colonoscopy Plan   Patient is scheduled for a screening colonoscopy on 12/21/2019.  The risks and benefits of the procedure including bleeding and perforation were fully explained to the patient, who gave informed consent.  A GoLYTELY prep has been prescribed.

## 2019-12-15 NOTE — H&P (Signed)
Kyle Giles; [id number]; July 22, 1960   HPI Patient is a 59 year old white male who was referred to my care by Dr. Hilma Favors for a screening colonoscopy.  He last had a colonoscopy 10 years ago.  He denies any blood per rectum, abnormal weight loss, abnormal diarrhea or constipation, or family history of colon cancer.  He was diagnosed in the past with diverticulosis on previous colonoscopy. Past Medical History:  Diagnosis Date  . Anxiety   . DM type 2 (diabetes mellitus, type 2) ([place])   . GERD (gastroesophageal reflux disease)   . Heart murmur    saw dr Willeen Cass 06-29-2019 echo done   . Hypercholesterolemia   . Hypertension   . Palpitations    none in a long time as of 10-19-2019  . S/P colonoscopy August 2011   Dr. Arnoldo Morale: Moderate diverticulosis throughout colon, otherwise normal   . Umbilical hernia     Past Surgical History:  Procedure Laterality Date  . cyst removed from finger  age 64  . HERNIA REPAIR     as baby  . pyloric stenosis repair     as baby, operated at [facility]  . UMBILICAL HERNIA REPAIR N/A 10/29/2019   Procedure: OPEN UMBILICAL HERNIA REPAIR WITH MESH;  Surgeon: Clovis Riley, MD;  Location: [facility];  Service: General;  Laterality: N/A;    Family History  Problem Relation Age of Onset  . Colon cancer Neg Hx     Current Outpatient Medications on File Prior to Visit  Medication Sig Dispense Refill  . acetaminophen (TYLENOL) 500 MG tablet Take 1,000 mg by mouth every 6 (six) hours as needed. Pain     . ALPRAZolam (XANAX) 1 MG tablet Take 1 mg by mouth daily as needed. Anxiety    . amLODipine (NORVASC) 5 MG tablet Take 5 mg by mouth. Takes 10 mg at hs    . fenofibrate 160 MG tablet Take 160 mg by mouth daily.      . metFORMIN (GLUCOPHAGE) 500 MG tablet Take 500 mg by mouth daily with breakfast. Takes 1 in am, 2 tabs with supper in pm    . olmesartan (BENICAR) 40 MG tablet Take 40 mg by mouth daily.    Marland Kitchen oxyCODONE (ROXICODONE) 5 MG  immediate release tablet Take 1 tablet (5 mg total) by mouth every 8 (eight) hours as needed. Alternate tylenol and ibuprofen for the first few days. Take narcotic pain medication only if needed for severe/ breakthrough pain. 10 tablet 0  . simvastatin (ZOCOR) 20 MG tablet Take 20 mg by mouth at bedtime.      No current facility-administered medications on file prior to visit.    No Known Allergies  Social History   Substance and Sexual Activity  Alcohol Use Yes   Comment: a few beers in the evening    Social History   Tobacco Use  Smoking Status Current Every Day Smoker  . Packs/day: 1.50  . Years: 30.00  . Pack years: 45.00  . Types: Cigarettes  Smokeless Tobacco Never Used  Tobacco Comment   1 ppd since 3 weeks as of 10-19-2019    Review of Systems  Constitutional: Negative.   HENT: Negative.   Eyes: Negative.   Respiratory: Negative.   Cardiovascular: Negative.   Gastrointestinal: Negative.   Genitourinary: Negative.   Musculoskeletal: Negative.   Skin: Negative.   Neurological: Negative.   Endo/Heme/Allergies: Negative.   Psychiatric/Behavioral: Negative.     Objective   Vitals:   12/14/19  1429  BP: (!) 145/83  Pulse: 80  Resp: 12  Temp: 98.1 F (36.7 C)  SpO2: 94%    Physical Exam Vitals reviewed.  Constitutional:      Appearance: Normal appearance. He is normal weight. He is not ill-appearing.  HENT:     Head: Normocephalic and atraumatic.  Cardiovascular:     Rate and Rhythm: Normal rate and regular rhythm.     Heart sounds: Normal heart sounds. No murmur heard.  No friction rub. No gallop.   Pulmonary:     Effort: Pulmonary effort is normal. No respiratory distress.     Breath sounds: Normal breath sounds. No stridor. No wheezing, rhonchi or rales.  Abdominal:     General: Bowel sounds are normal. There is no distension.     Palpations: Abdomen is soft. There is no mass.     Tenderness: There is no abdominal tenderness. There is no  guarding or rebound.     Hernia: No hernia is present.  Skin:    General: Skin is warm and dry.  Neurological:     Mental Status: He is alert and oriented to person, place, and time.     Assessment  Need for screening colonoscopy Plan   Patient is scheduled for a screening colonoscopy on 12/21/2019.  The risks and benefits of the procedure including bleeding and perforation were fully explained to the patient, who gave informed consent.  A GoLYTELY prep has been prescribed.

## 2019-12-20 ENCOUNTER — Other Ambulatory Visit (HOSPITAL_COMMUNITY)
Admission: RE | Admit: 2019-12-20 | Discharge: 2019-12-20 | Disposition: A | Discharge status: Home / Self Care | Payer: BC Managed Care – PPO | Source: Ambulatory Visit | Attending: General Surgery | Admitting: General Surgery

## 2019-12-20 ENCOUNTER — Other Ambulatory Visit: Payer: Self-pay

## 2019-12-20 DIAGNOSIS — Z20822 Contact with and (suspected) exposure to covid-19: Secondary | ICD-10-CM | POA: Insufficient documentation

## 2019-12-20 DIAGNOSIS — Z7984 Long term (current) use of oral hypoglycemic drugs: Secondary | ICD-10-CM | POA: Diagnosis not present

## 2019-12-20 DIAGNOSIS — K5730 Diverticulosis of large intestine without perforation or abscess without bleeding: Secondary | ICD-10-CM | POA: Diagnosis not present

## 2019-12-20 DIAGNOSIS — Z1211 Encounter for screening for malignant neoplasm of colon: Secondary | ICD-10-CM | POA: Diagnosis not present

## 2019-12-20 DIAGNOSIS — Z01812 Encounter for preprocedural laboratory examination: Secondary | ICD-10-CM | POA: Insufficient documentation

## 2019-12-20 DIAGNOSIS — K621 Rectal polyp: Secondary | ICD-10-CM | POA: Diagnosis not present

## 2019-12-20 DIAGNOSIS — Z79899 Other long term (current) drug therapy: Secondary | ICD-10-CM | POA: Diagnosis not present

## 2019-12-20 DIAGNOSIS — F17210 Nicotine dependence, cigarettes, uncomplicated: Secondary | ICD-10-CM | POA: Diagnosis not present

## 2019-12-20 LAB — SARS CORONAVIRUS 2 (TAT 6-24 HRS): SARS Coronavirus 2: NEGATIVE

## 2019-12-21 ENCOUNTER — Encounter (HOSPITAL_COMMUNITY): Payer: Self-pay | Admitting: General Surgery

## 2019-12-21 ENCOUNTER — Encounter (HOSPITAL_COMMUNITY)
Admission: RE | Disposition: A | Discharge status: Home / Self Care | Payer: Self-pay | Source: Home / Self Care | Attending: General Surgery

## 2019-12-21 ENCOUNTER — Ambulatory Visit (HOSPITAL_COMMUNITY)
Admission: RE | Admit: 2019-12-21 | Discharge: 2019-12-21 | Disposition: A | Discharge status: Home / Self Care | Payer: BC Managed Care – PPO | Attending: General Surgery | Admitting: General Surgery

## 2019-12-21 ENCOUNTER — Other Ambulatory Visit: Payer: Self-pay

## 2019-12-21 DIAGNOSIS — Z79899 Other long term (current) drug therapy: Secondary | ICD-10-CM | POA: Insufficient documentation

## 2019-12-21 DIAGNOSIS — Z1211 Encounter for screening for malignant neoplasm of colon: Secondary | ICD-10-CM | POA: Diagnosis not present

## 2019-12-21 DIAGNOSIS — Z7984 Long term (current) use of oral hypoglycemic drugs: Secondary | ICD-10-CM | POA: Diagnosis not present

## 2019-12-21 DIAGNOSIS — K621 Rectal polyp: Secondary | ICD-10-CM | POA: Insufficient documentation

## 2019-12-21 DIAGNOSIS — F17210 Nicotine dependence, cigarettes, uncomplicated: Secondary | ICD-10-CM | POA: Diagnosis not present

## 2019-12-21 DIAGNOSIS — K5730 Diverticulosis of large intestine without perforation or abscess without bleeding: Secondary | ICD-10-CM

## 2019-12-21 DIAGNOSIS — Z20822 Contact with and (suspected) exposure to covid-19: Secondary | ICD-10-CM | POA: Diagnosis not present

## 2019-12-21 HISTORY — PX: POLYPECTOMY: SHX5525

## 2019-12-21 HISTORY — PX: COLONOSCOPY: SHX5424

## 2019-12-21 LAB — GLUCOSE, CAPILLARY: Glucose-Capillary: 87 mg/dL (ref 70–99)

## 2019-12-21 SURGERY — COLONOSCOPY
Anesthesia: Moderate Sedation

## 2019-12-21 MED ORDER — STERILE WATER FOR IRRIGATION IR SOLN
Status: DC | PRN
  Administered 2019-12-21: 1.5 mL

## 2019-12-21 MED ORDER — MIDAZOLAM HCL 5 MG/5ML IJ SOLN
INTRAMUSCULAR | Status: DC | PRN
  Administered 2019-12-21: 1 mg via INTRAVENOUS
  Administered 2019-12-21: 3 mg via INTRAVENOUS

## 2019-12-21 MED ORDER — SODIUM CHLORIDE 0.9 % IV SOLN: INTRAVENOUS | Status: DC

## 2019-12-21 MED ORDER — MIDAZOLAM HCL 5 MG/5ML IJ SOLN
INTRAMUSCULAR | Status: AC
  Filled 2019-12-21: qty 10

## 2019-12-21 MED ORDER — MEPERIDINE HCL 50 MG/ML IJ SOLN
INTRAMUSCULAR | Status: DC | PRN
Start: 2019-12-21 — End: 2019-12-21
  Administered 2019-12-21: 50 mg via INTRAVENOUS

## 2019-12-21 MED ORDER — MEPERIDINE HCL 50 MG/ML IJ SOLN
INTRAMUSCULAR | Status: AC
  Filled 2019-12-21: qty 1

## 2019-12-21 NOTE — Interval H&P Note (Signed)
History and Physical Interval Note:  12/21/2019 8:19 AM  Kyle Giles  has presented today for surgery, with the diagnosis of Screening.  The various methods of treatment have been discussed with the patient and family. After consideration of risks, benefits and other options for treatment, the patient has consented to  Procedure(s): COLONOSCOPY (N/A) as a surgical intervention.  The patient's history has been reviewed, patient examined, no change in status, stable for surgery.  I have reviewed the patient's chart and labs.  Questions were answered to the patient's satisfaction.     Aviva Signs

## 2019-12-21 NOTE — Discharge Instructions (Signed)
Colonoscopy, Adult, Care After This sheet gives you information about how to care for yourself after your procedure. Your health care provider may also give you more specific instructions. If you have problems or questions, contact your health care provider. What can I expect after the procedure? After the procedure, it is common to have:  A small amount of blood in your stool for 24 hours after the procedure.  Some gas.  Mild cramping or bloating of your abdomen. Follow these instructions at home: Eating and drinking   Drink enough fluid to keep your urine pale yellow.  Follow instructions from your health care provider about eating or drinking restrictions.  Resume your normal diet as instructed by your health care provider. Avoid heavy or fried foods that are hard to digest. Activity  Rest as told by your health care provider.  Avoid sitting for a long time without moving. Get up to take short walks every 1-2 hours. This is important to improve blood flow and breathing. Ask for help if you feel weak or unsteady.  Return to your normal activities as told by your health care provider. Ask your health care provider what activities are safe for you. Managing cramping and bloating   Try walking around when you have cramps or feel bloated.  Apply heat to your abdomen as told by your health care provider. Use the heat source that your health care provider recommends, such as a moist heat pack or a heating pad. ? Place a towel between your skin and the heat source. ? Leave the heat on for 20-30 minutes. ? Remove the heat if your skin turns bright red. This is especially important if you are unable to feel pain, heat, or cold. You may have a greater risk of getting burned. General instructions  For the first 24 hours after the procedure: ? Do not drive or use machinery. ? Do not sign important documents. ? Do not drink alcohol. ? Do your regular daily activities at a slower pace  than normal. ? Eat soft foods that are easy to digest.  Take over-the-counter and prescription medicines only as told by your health care provider.  Keep all follow-up visits as told by your health care provider. This is important. Contact a health care provider if:  You have blood in your stool 2-3 days after the procedure. Get help right away if you have:  More than a small spotting of blood in your stool.  Large blood clots in your stool.  Swelling of your abdomen.  Nausea or vomiting.  A fever.  Increasing pain in your abdomen that is not relieved with medicine. Summary  After the procedure, it is common to have a small amount of blood in your stool. You may also have mild cramping and bloating of your abdomen.  For the first 24 hours after the procedure, do not drive or use machinery, sign important documents, or drink alcohol.  Get help right away if you have a lot of blood in your stool, nausea or vomiting, a fever, or increased pain in your abdomen. This information is not intended to replace advice given to you by your health care provider. Make sure you discuss any questions you have with your health care provider. Document Revised: 08/17/2018 Document Reviewed: 08/17/2018 Elsevier Patient Education  [place].

## 2019-12-21 NOTE — Op Note (Signed)
[facility] Patient Name: Kyle Giles Procedure Date: 12/21/2019 8:01 AM MRN: [id number] Date of Birth: May 22, 1960 Attending MD: Aviva Signs , MD CSN: [id number] Age: 59 Admit Type: Outpatient Procedure:                Colonoscopy Indications:              Screening for colorectal malignant neoplasm Providers:                Aviva Signs, MD, Charlsie Quest. Theda Sers RN, RN, Wynonia Musty Tech, Technician Referring MD:              Medicines:                Midazolam 4 mg IV, Meperidine 50 mg IV Complications:            No immediate complications. Estimated Blood Loss:     Estimated blood loss: none. Procedure:                Pre-Anesthesia Assessment:                           - Prior to the procedure, a History and Physical                            was performed, and patient medications and                            allergies were reviewed. The patient is competent.                            The risks and benefits of the procedure and the                            sedation options and risks were discussed with the                            patient. All questions were answered and informed                            consent was obtained. Patient identification and                            proposed procedure were verified by the physician,                            the nurse and the technician in the endoscopy                            suite. Mental Status Examination: alert and                            oriented. Airway Examination: normal oropharyngeal  airway and neck mobility. Respiratory Examination:                            clear to auscultation. CV Examination: RRR, no                            murmurs, no S3 or S4. Prophylactic Antibiotics: The                            patient does not require prophylactic antibiotics.                            Prior Anticoagulants: The patient has taken no                             previous anticoagulant or antiplatelet agents. ASA                            Grade Assessment: II - A patient with mild systemic                            disease. After reviewing the risks and benefits,                            the patient was deemed in satisfactory condition to                            undergo the procedure. The anesthesia plan was to                            use moderate sedation / analgesia (conscious                            sedation). Immediately prior to administration of                            medications, the patient was re-assessed for                            adequacy to receive sedatives. The heart rate,                            respiratory rate, oxygen saturations, blood                            pressure, adequacy of pulmonary ventilation, and                            response to care were monitored throughout the                            procedure. The physical status of the patient was  re-assessed after the procedure.                           After obtaining informed consent, the colonoscope                            was passed under direct vision. Throughout the                            procedure, the patient's blood pressure, pulse, and                            oxygen saturations were monitored continuously. The                            [id number]-[id number] ([id number]) scope was introduced through                            the anus and advanced to the the cecum, identified                            by the appendiceal orifice, ileocecal valve and                            palpation. No anatomical landmarks were                            photographed. The entire colon was well visualized.                            The colonoscopy was performed without difficulty.                            The patient tolerated the procedure well. The                            quality of the bowel preparation  was adequate. The                            total duration of the procedure was 15 minutes. Scope In: 8:25:43 AM Scope Out: 8:38:45 AM Scope Withdrawal Time: 0 hours 7 minutes 5 seconds  Total Procedure Duration: 0 hours 13 minutes 2 seconds  Findings:      The perianal and digital rectal examinations were normal.      A 3 mm polyp was found in the proximal rectum. The polyp was sessile.       The polyp was removed with a hot snare. Resection and retrieval were       complete. Estimated blood loss: none. Estimated blood loss: none.      Multiple small-mouthed diverticula were found in the sigmoid colon and       ascending colon. Estimated blood loss: none.      The exam was otherwise without abnormality on direct and retroflexion       views. Impression:               -  One 3 mm polyp in the proximal rectum, removed                            with a hot snare. Resected and retrieved.                           - Diverticulosis in the sigmoid colon and in the                            ascending colon.                           - The examination was otherwise normal on direct                            and retroflexion views. Moderate Sedation:      Moderate (conscious) sedation was administered by the endoscopy nurse       and supervised by the endoscopist. The patient's oxygen saturation,       heart rate, blood pressure and response to care were monitored. Recommendation:           - Written discharge instructions were provided to                            the patient.                           - The signs and symptoms of potential delayed                            complications were discussed with the patient.                           - Patient has a contact number available for                            emergencies.                           - Return to normal activities tomorrow.                           - Resume previous diet.                           - Continue present  medications.                           - Repeat colonoscopy is recommended [Repeat                            reason]. The colonoscopy date will be determined                            after pathology results from today's exam become  available for review. Procedure Code(s):        --- Professional ---                           [phone], Colonoscopy, flexible; with removal of                            tumor(s), polyp(s), or other lesion(s) by snare                            technique Diagnosis Code(s):        --- Professional ---                           Z12.11, Encounter for screening for malignant                            neoplasm of colon                           K62.1, Rectal polyp                           K57.30, Diverticulosis of large intestine without                            perforation or abscess without bleeding CPT copyright 2019 [employer]. All rights reserved. The codes documented in this report are preliminary and upon coder review may  be revised to meet current compliance requirements. Aviva Signs, MD Aviva Signs, MD 12/21/2019 8:53:46 AM This report has been signed electronically. Number of Addenda: 0

## 2019-12-22 LAB — SURGICAL PATHOLOGY

## 2019-12-23 ENCOUNTER — Telehealth (INDEPENDENT_AMBULATORY_CARE_PROVIDER_SITE_OTHER): Payer: BC Managed Care – PPO | Admitting: General Surgery

## 2019-12-23 DIAGNOSIS — Z09 Encounter for follow-up examination after completed treatment for conditions other than malignant neoplasm: Secondary | ICD-10-CM

## 2019-12-23 NOTE — Telephone Encounter (Signed)
Patient notified that pathology results were benign.  Will need follow-up colonoscopy for screening purposes in 10 years.

## 2019-12-27 ENCOUNTER — Encounter (HOSPITAL_COMMUNITY): Payer: Self-pay | Admitting: General Surgery

## 2020-02-14 DIAGNOSIS — Z1389 Encounter for screening for other disorder: Secondary | ICD-10-CM | POA: Diagnosis not present

## 2020-02-14 DIAGNOSIS — Z6824 Body mass index (BMI) 24.0-24.9, adult: Secondary | ICD-10-CM | POA: Diagnosis not present

## 2020-02-14 DIAGNOSIS — E7849 Other hyperlipidemia: Secondary | ICD-10-CM | POA: Diagnosis not present

## 2020-02-14 DIAGNOSIS — R300 Dysuria: Secondary | ICD-10-CM | POA: Diagnosis not present

## 2020-02-14 DIAGNOSIS — I10 Essential (primary) hypertension: Secondary | ICD-10-CM | POA: Diagnosis not present

## 2020-02-14 DIAGNOSIS — E1165 Type 2 diabetes mellitus with hyperglycemia: Secondary | ICD-10-CM | POA: Diagnosis not present

## 2020-02-14 DIAGNOSIS — Z1331 Encounter for screening for depression: Secondary | ICD-10-CM | POA: Diagnosis not present

## 2020-05-12 DIAGNOSIS — E785 Hyperlipidemia, unspecified: Secondary | ICD-10-CM | POA: Diagnosis not present

## 2020-05-12 DIAGNOSIS — R011 Cardiac murmur, unspecified: Secondary | ICD-10-CM | POA: Diagnosis not present

## 2020-05-12 DIAGNOSIS — Z1389 Encounter for screening for other disorder: Secondary | ICD-10-CM | POA: Diagnosis not present

## 2020-05-12 DIAGNOSIS — F17200 Nicotine dependence, unspecified, uncomplicated: Secondary | ICD-10-CM | POA: Diagnosis not present

## 2020-05-12 DIAGNOSIS — Z1331 Encounter for screening for depression: Secondary | ICD-10-CM | POA: Diagnosis not present

## 2020-05-12 DIAGNOSIS — R9431 Abnormal electrocardiogram [ECG] [EKG]: Secondary | ICD-10-CM | POA: Diagnosis not present

## 2020-05-12 DIAGNOSIS — I10 Essential (primary) hypertension: Secondary | ICD-10-CM | POA: Diagnosis not present

## 2020-05-12 DIAGNOSIS — E119 Type 2 diabetes mellitus without complications: Secondary | ICD-10-CM | POA: Diagnosis not present

## 2020-05-12 DIAGNOSIS — Z6824 Body mass index (BMI) 24.0-24.9, adult: Secondary | ICD-10-CM | POA: Diagnosis not present

## 2020-05-12 DIAGNOSIS — Z0001 Encounter for general adult medical examination with abnormal findings: Secondary | ICD-10-CM | POA: Diagnosis not present

## 2020-08-04 ENCOUNTER — Other Ambulatory Visit (HOSPITAL_COMMUNITY): Payer: Self-pay

## 2020-08-04 DIAGNOSIS — E119 Type 2 diabetes mellitus without complications: Secondary | ICD-10-CM | POA: Diagnosis not present

## 2020-08-04 DIAGNOSIS — I10 Essential (primary) hypertension: Secondary | ICD-10-CM | POA: Diagnosis not present

## 2020-08-04 DIAGNOSIS — F419 Anxiety disorder, unspecified: Secondary | ICD-10-CM | POA: Diagnosis not present

## 2020-08-04 DIAGNOSIS — E785 Hyperlipidemia, unspecified: Secondary | ICD-10-CM | POA: Diagnosis not present

## 2020-08-04 DIAGNOSIS — Z6824 Body mass index (BMI) 24.0-24.9, adult: Secondary | ICD-10-CM | POA: Diagnosis not present

## 2020-08-04 MED ORDER — DILTIAZEM HCL ER COATED BEADS 360 MG PO CP24
360.0000 mg | ORAL_CAPSULE | Freq: Every evening | ORAL | 3 refills | Status: DC
Start: 2020-08-04 — End: 2021-07-23
  Filled 2020-08-04: qty 90, 90d supply, fill #0
  Filled 2020-10-30: qty 90, 90d supply, fill #1
  Filled 2021-01-21: qty 90, 90d supply, fill #2
  Filled 2021-04-15: qty 90, 90d supply, fill #3

## 2020-08-04 MED ORDER — CETIRIZINE HCL 10 MG PO TABS
ORAL_TABLET | ORAL | 3 refills | Status: DC
Start: 2020-08-04 — End: 2021-08-09
  Filled 2020-08-04: qty 90, 90d supply, fill #0
  Filled 2020-11-14: qty 90, 90d supply, fill #1
  Filled 2021-03-26: qty 90, 90d supply, fill #2

## 2020-08-04 MED ORDER — ALPRAZOLAM 1 MG PO TABS
1.0000 mg | ORAL_TABLET | Freq: Three times a day (TID) | ORAL | 0 refills | Status: DC | PRN
Start: 2020-08-04 — End: 2021-04-25
  Filled 2020-08-04 – 2020-08-14 (×4): qty 270, 90d supply, fill #0

## 2020-08-04 MED ORDER — OLMESARTAN MEDOXOMIL 40 MG PO TABS
ORAL_TABLET | ORAL | 1 refills | Status: DC
Start: 2020-08-04 — End: 2021-02-13
  Filled 2020-08-04: qty 90, 90d supply, fill #0
  Filled 2020-11-14: qty 90, 90d supply, fill #1

## 2020-08-04 MED ORDER — FENOFIBRATE 160 MG PO TABS
160.0000 mg | ORAL_TABLET | Freq: Every day | ORAL | 0 refills | Status: DC
Start: 2020-08-04 — End: 2021-05-30
  Filled 2020-08-04: qty 90, 90d supply, fill #0

## 2020-08-04 MED ORDER — SIMVASTATIN 20 MG PO TABS
ORAL_TABLET | ORAL | 1 refills | Status: DC
Start: 2020-08-04 — End: 2021-01-22
  Filled 2020-08-04: qty 90, 90d supply, fill #0
  Filled 2020-10-30: qty 90, 90d supply, fill #1

## 2020-08-04 MED ORDER — METFORMIN HCL 500 MG PO TABS
ORAL_TABLET | ORAL | 0 refills | Status: DC
Start: 2020-08-04 — End: 2020-12-18
  Filled 2020-08-04: qty 360, 90d supply, fill #0

## 2020-08-08 ENCOUNTER — Other Ambulatory Visit (HOSPITAL_COMMUNITY): Payer: Self-pay

## 2020-08-14 ENCOUNTER — Other Ambulatory Visit (HOSPITAL_COMMUNITY): Payer: Self-pay

## 2020-08-22 ENCOUNTER — Other Ambulatory Visit (HOSPITAL_COMMUNITY): Payer: Self-pay

## 2020-10-13 ENCOUNTER — Other Ambulatory Visit (HOSPITAL_COMMUNITY): Payer: Self-pay

## 2020-10-13 MED ORDER — SODIUM FLUORIDE 1.1 % DT GEL
DENTAL | 4 refills | Status: DC
  Filled 2020-10-13: qty 56, 30d supply, fill #0

## 2020-10-30 ENCOUNTER — Other Ambulatory Visit (HOSPITAL_COMMUNITY): Payer: Self-pay

## 2020-11-13 ENCOUNTER — Other Ambulatory Visit (HOSPITAL_COMMUNITY): Payer: Self-pay

## 2020-11-13 DIAGNOSIS — R9431 Abnormal electrocardiogram [ECG] [EKG]: Secondary | ICD-10-CM | POA: Diagnosis not present

## 2020-11-13 DIAGNOSIS — E785 Hyperlipidemia, unspecified: Secondary | ICD-10-CM | POA: Diagnosis not present

## 2020-11-13 DIAGNOSIS — F17200 Nicotine dependence, unspecified, uncomplicated: Secondary | ICD-10-CM | POA: Diagnosis not present

## 2020-11-13 DIAGNOSIS — F17210 Nicotine dependence, cigarettes, uncomplicated: Secondary | ICD-10-CM | POA: Diagnosis not present

## 2020-11-13 DIAGNOSIS — Z23 Encounter for immunization: Secondary | ICD-10-CM | POA: Diagnosis not present

## 2020-11-13 DIAGNOSIS — R011 Cardiac murmur, unspecified: Secondary | ICD-10-CM | POA: Diagnosis not present

## 2020-11-13 DIAGNOSIS — F419 Anxiety disorder, unspecified: Secondary | ICD-10-CM | POA: Diagnosis not present

## 2020-11-13 DIAGNOSIS — I10 Essential (primary) hypertension: Secondary | ICD-10-CM | POA: Diagnosis not present

## 2020-11-13 DIAGNOSIS — E663 Overweight: Secondary | ICD-10-CM | POA: Diagnosis not present

## 2020-11-13 DIAGNOSIS — E1165 Type 2 diabetes mellitus with hyperglycemia: Secondary | ICD-10-CM | POA: Diagnosis not present

## 2020-11-13 DIAGNOSIS — Z6823 Body mass index (BMI) 23.0-23.9, adult: Secondary | ICD-10-CM | POA: Diagnosis not present

## 2020-11-13 MED ORDER — ALPRAZOLAM 1 MG PO TABS
ORAL_TABLET | ORAL | 0 refills | Status: DC
Start: 2020-11-13 — End: 2021-04-25
  Filled 2020-11-13: qty 270, 90d supply, fill #0

## 2020-11-13 MED ORDER — FENOFIBRATE 160 MG PO TABS
ORAL_TABLET | ORAL | 2 refills | Status: DC
Start: 2020-11-13 — End: 2021-04-27
  Filled 2020-11-13: qty 90, 90d supply, fill #0
  Filled 2021-02-12: qty 90, 90d supply, fill #1

## 2020-11-14 ENCOUNTER — Other Ambulatory Visit (HOSPITAL_COMMUNITY): Payer: Self-pay

## 2020-12-04 DIAGNOSIS — F17210 Nicotine dependence, cigarettes, uncomplicated: Secondary | ICD-10-CM | POA: Diagnosis not present

## 2020-12-04 DIAGNOSIS — J209 Acute bronchitis, unspecified: Secondary | ICD-10-CM | POA: Diagnosis not present

## 2020-12-18 ENCOUNTER — Other Ambulatory Visit (HOSPITAL_COMMUNITY): Payer: Self-pay

## 2020-12-18 MED ORDER — METFORMIN HCL 500 MG PO TABS
ORAL_TABLET | ORAL | 3 refills | Status: DC
Start: 2020-12-18 — End: 2021-04-25
  Filled 2020-12-18: qty 360, 90d supply, fill #0

## 2020-12-23 ENCOUNTER — Other Ambulatory Visit (HOSPITAL_COMMUNITY): Payer: Self-pay

## 2020-12-23 MED ORDER — ALBUTEROL SULFATE HFA 108 (90 BASE) MCG/ACT IN AERS
INHALATION_SPRAY | RESPIRATORY_TRACT | 1 refills | Status: DC
  Filled 2020-12-23: qty 8.5, 25d supply, fill #0
  Filled 2021-04-06: qty 8.5, 25d supply, fill #1

## 2021-01-21 ENCOUNTER — Other Ambulatory Visit (HOSPITAL_COMMUNITY): Payer: Self-pay

## 2021-01-22 ENCOUNTER — Other Ambulatory Visit (HOSPITAL_COMMUNITY): Payer: Self-pay

## 2021-01-22 DIAGNOSIS — F419 Anxiety disorder, unspecified: Secondary | ICD-10-CM | POA: Diagnosis not present

## 2021-01-22 DIAGNOSIS — Z6823 Body mass index (BMI) 23.0-23.9, adult: Secondary | ICD-10-CM | POA: Diagnosis not present

## 2021-01-22 DIAGNOSIS — R400 Somnolence: Secondary | ICD-10-CM | POA: Diagnosis not present

## 2021-01-22 DIAGNOSIS — E119 Type 2 diabetes mellitus without complications: Secondary | ICD-10-CM | POA: Diagnosis not present

## 2021-01-22 DIAGNOSIS — F17210 Nicotine dependence, cigarettes, uncomplicated: Secondary | ICD-10-CM | POA: Diagnosis not present

## 2021-01-22 DIAGNOSIS — E663 Overweight: Secondary | ICD-10-CM | POA: Diagnosis not present

## 2021-01-22 MED ORDER — SIMVASTATIN 20 MG PO TABS
ORAL_TABLET | ORAL | 1 refills | Status: DC
Start: 2021-01-22 — End: 2021-07-23
  Filled 2021-01-22: qty 90, 90d supply, fill #0
  Filled 2021-04-15: qty 90, 90d supply, fill #1

## 2021-02-08 DIAGNOSIS — G4730 Sleep apnea, unspecified: Secondary | ICD-10-CM | POA: Diagnosis not present

## 2021-02-12 ENCOUNTER — Other Ambulatory Visit (HOSPITAL_COMMUNITY): Payer: Self-pay

## 2021-02-13 ENCOUNTER — Other Ambulatory Visit (HOSPITAL_COMMUNITY): Payer: Self-pay

## 2021-02-13 MED ORDER — OLMESARTAN MEDOXOMIL 40 MG PO TABS
ORAL_TABLET | ORAL | 1 refills | Status: DC
  Filled 2021-02-13: qty 90, 90d supply, fill #0

## 2021-02-19 ENCOUNTER — Other Ambulatory Visit (HOSPITAL_COMMUNITY): Payer: Self-pay

## 2021-02-19 DIAGNOSIS — E785 Hyperlipidemia, unspecified: Secondary | ICD-10-CM | POA: Diagnosis not present

## 2021-02-19 DIAGNOSIS — I10 Essential (primary) hypertension: Secondary | ICD-10-CM | POA: Diagnosis not present

## 2021-02-19 DIAGNOSIS — F17210 Nicotine dependence, cigarettes, uncomplicated: Secondary | ICD-10-CM | POA: Diagnosis not present

## 2021-02-19 DIAGNOSIS — E1165 Type 2 diabetes mellitus with hyperglycemia: Secondary | ICD-10-CM | POA: Diagnosis not present

## 2021-02-19 DIAGNOSIS — R400 Somnolence: Secondary | ICD-10-CM | POA: Diagnosis not present

## 2021-02-19 DIAGNOSIS — F17200 Nicotine dependence, unspecified, uncomplicated: Secondary | ICD-10-CM | POA: Diagnosis not present

## 2021-02-19 DIAGNOSIS — E7849 Other hyperlipidemia: Secondary | ICD-10-CM | POA: Diagnosis not present

## 2021-02-19 DIAGNOSIS — J209 Acute bronchitis, unspecified: Secondary | ICD-10-CM | POA: Diagnosis not present

## 2021-02-19 DIAGNOSIS — Z6823 Body mass index (BMI) 23.0-23.9, adult: Secondary | ICD-10-CM | POA: Diagnosis not present

## 2021-02-19 DIAGNOSIS — Z23 Encounter for immunization: Secondary | ICD-10-CM | POA: Diagnosis not present

## 2021-02-19 MED ORDER — OLMESARTAN MEDOXOMIL-HCTZ 40-25 MG PO TABS
ORAL_TABLET | ORAL | 1 refills | Status: DC
  Filled 2021-02-19: qty 90, 90d supply, fill #0
  Filled 2021-04-27: qty 90, 90d supply, fill #1

## 2021-02-19 MED ORDER — ALPRAZOLAM 1 MG PO TABS
ORAL_TABLET | ORAL | 0 refills | Status: DC
  Filled 2021-02-19: qty 270, 90d supply, fill #0

## 2021-02-20 ENCOUNTER — Other Ambulatory Visit (HOSPITAL_COMMUNITY): Payer: Self-pay

## 2021-02-20 MED ORDER — VITAMIN D (ERGOCALCIFEROL) 1.25 MG (50000 UNIT) PO CAPS
ORAL_CAPSULE | ORAL | 0 refills | Status: DC
  Filled 2021-02-20: qty 12, 84d supply, fill #0

## 2021-03-26 ENCOUNTER — Other Ambulatory Visit (HOSPITAL_COMMUNITY): Payer: Self-pay

## 2021-04-06 ENCOUNTER — Telehealth: Payer: 59 | Admitting: Physician Assistant

## 2021-04-06 ENCOUNTER — Other Ambulatory Visit (HOSPITAL_COMMUNITY): Payer: Self-pay

## 2021-04-06 DIAGNOSIS — U071 COVID-19: Secondary | ICD-10-CM | POA: Diagnosis not present

## 2021-04-06 MED ORDER — BENZONATATE 100 MG PO CAPS: 100.0000 mg | ORAL_CAPSULE | Freq: Three times a day (TID) | ORAL | 0 refills | Status: DC | PRN

## 2021-04-06 MED ORDER — MOLNUPIRAVIR EUA 200MG CAPSULE: 4.0000 | ORAL_CAPSULE | Freq: Two times a day (BID) | ORAL | 0 refills | Status: AC

## 2021-04-06 MED ORDER — PROMETHAZINE-DM 6.25-15 MG/5ML PO SYRP: 5.0000 mL | ORAL_SOLUTION | Freq: Four times a day (QID) | ORAL | 0 refills | Status: DC | PRN

## 2021-04-06 NOTE — Progress Notes (Signed)
Virtual Visit Consent   [name], you are scheduled for a virtual visit with a [facility] provider today.     Just as with appointments in the office, your consent must be obtained to participate.  Your consent will be active for this visit and any virtual visit you may have with one of our providers in the next 365 days.     If you have a MyChart account, a copy of this consent can be sent to you electronically.  All virtual visits are billed to your insurance company just like a traditional visit in the office.    As this is a virtual visit, video technology does not allow for your provider to perform a traditional examination.  This may limit your provider's ability to fully assess your condition.  If your provider identifies any concerns that need to be evaluated in person or the need to arrange testing (such as labs, EKG, etc.), we will make arrangements to do so.     Although advances in technology are sophisticated, we cannot ensure that it will always work on either your end or our end.  If the connection with a video visit is poor, the visit may have to be switched to a telephone visit.  With either a video or telephone visit, we are not always able to ensure that we have a secure connection.     I need to obtain your verbal consent now.   Are you willing to proceed with your visit today?    Kyle Giles has provided verbal consent on 04/06/2021 for a virtual visit (video or telephone).   [name], PA-C   Date: 04/06/2021 8:57 AM   Virtual Visit via Video Note   I, [name], connected with  Kyle Giles  ([id number], 05/09/1960) on 04/06/21 at  8:45 AM EST by a video-enabled telemedicine application and verified that I am speaking with the correct person using two identifiers.  Location: Patient: Virtual Visit Location Patient: Home Provider: Virtual Visit Location Provider: Home Office   I discussed the limitations of evaluation and management by  telemedicine and the availability of in person appointments. The patient expressed understanding and agreed to proceed.    History of Present Illness: Kyle Giles is a 61 y.o. who identifies as a male who was assigned male at birth, and is being seen today for Covid 63.  HPI: URI  This is a new problem. Episode onset: tested positive for Covid this morning; symptoms started yesterday. The problem has been gradually worsening. Maximum temperature: 99.9. The fever has been present for Less than 1 day. Associated symptoms include congestion, coughing, diarrhea and rhinorrhea. Pertinent negatives include no ear pain, headaches, nausea, sinus pain, sore throat or vomiting. Associated symptoms comments: Malaise, fatigue. He has tried acetaminophen for the symptoms. The treatment provided no relief.     Problems:  Patient Active Problem List   Diagnosis Date Noted   Special screening for malignant neoplasms, colon    Rectal polyp    Diverticulosis of large intestine without diverticulitis    Epigastric pain 09/11/2010    Allergies: No Known Allergies Medications:  Current Outpatient Medications:    benzonatate (TESSALON) 100 MG capsule, Take 1 capsule (100 mg total) by mouth 3 (three) times daily as needed., Disp: 30 capsule, Rfl: 0   molnupiravir EUA (LAGEVRIO) 200 mg CAPS capsule, Take 4 capsules (800 mg total) by mouth 2 (two) times daily for 5 days., Disp: 40  capsule, Rfl: 0   promethazine-dextromethorphan (PROMETHAZINE-DM) 6.25-15 MG/5ML syrup, Take 5 mLs by mouth 4 (four) times daily as needed., Disp: 118 mL, Rfl: 0   acetaminophen (TYLENOL) 500 MG tablet, Take 500 mg by mouth every 6 (six) hours as needed for moderate pain or headache. Pain , Disp: , Rfl:    albuterol (VENTOLIN HFA) 108 (90 Base) MCG/ACT inhaler, Inhale 1 or 2 puffs every 4 hours as needed, Disp: 8.5 g, Rfl: 1   ALPRAZolam (XANAX) 1 MG tablet, Take 0.5-1 mg by mouth daily as needed for anxiety. , Disp: , Rfl:     ALPRAZolam (XANAX) 1 MG tablet, Take 1 tablet (1 mg total) by mouth 3 (three) times daily as needed., Disp: 270 tablet, Rfl: 0   ALPRAZolam (XANAX) 1 MG tablet, Take 1 tablet by mouth 3 times daily, Disp: 270 tablet, Rfl: 0   ALPRAZolam (XANAX) 1 MG tablet, Take 1 tablet by mouth 3 times a day, Disp: 270 tablet, Rfl: 0   amLODipine (NORVASC) 10 MG tablet, Take 10 mg by mouth every evening., Disp: , Rfl:    cetirizine (ZYRTEC) 10 MG tablet, Take 10 mg by mouth daily as needed for allergies., Disp: , Rfl:    cetirizine (ZYRTEC) 10 MG tablet, TAKE (1) TABLET BY MOUTH ONCE DAILY., Disp: 90 tablet, Rfl: 3   DENTA 5000 PLUS 1.1 % CREA dental cream, Take 1 application by mouth 2 (two) times daily., Disp: , Rfl:    diltiazem (CARDIZEM CD) 360 MG 24 hr capsule, Take 1 capsule (360 mg total) by mouth every evening., Disp: 90 capsule, Rfl: 3   fenofibrate 160 MG tablet, Take 160 mg by mouth daily.  , Disp: , Rfl:    fenofibrate 160 MG tablet, TAKE ONE TABLET BY MOUTH ONCE DAILY., Disp: 90 tablet, Rfl: 0   fenofibrate 160 MG tablet, Take 1 tablet by mouth daily, Disp: 90 tablet, Rfl: 2   ibuprofen (ADVIL) 200 MG tablet, Take 200 mg by mouth every 6 (six) hours as needed for headache or moderate pain., Disp: , Rfl:    metFORMIN (GLUCOPHAGE) 500 MG tablet, Take 500-1,000 mg by mouth See admin instructions. Take 500 mg in the morning and 1000 mg in the evening, Disp: , Rfl:    metFORMIN (GLUCOPHAGE) 500 MG tablet, TAKE 2 TABLETS BY MOUTH TWICE DAILY., Disp: 360 tablet, Rfl: 3   olmesartan (BENICAR) 40 MG tablet, Take 40 mg by mouth daily., Disp: , Rfl:    olmesartan (BENICAR) 40 MG tablet, TAKE 1 TABLET BY MOUTH ONCE DAILY., Disp: 90 tablet, Rfl: 1   olmesartan-hydrochlorothiazide (BENICAR HCT) 40-25 MG tablet, Take 1 tablet by mouth once a day, Disp: 90 tablet, Rfl: 1   PRESCRIPTION MEDICATION, Apply 1 application topically daily as needed (pain). Compounded pain cream diclofenac 3%, baclofen 2%, gabapentin 5%,  lidocaine 5%, menthol  1%, Disp: , Rfl:    Propylene Glycol (SYSTANE COMPLETE) 0.6 % SOLN, Place 1 drop into both eyes 2 (two) times daily., Disp: , Rfl:    simvastatin (ZOCOR) 20 MG tablet, Take 20 mg by mouth every evening. , Disp: , Rfl:    simvastatin (ZOCOR) 20 MG tablet, TAKE 1 TABLET BY MOUTH ONCE DAILY., Disp: 90 tablet, Rfl: 1   sodium fluoride (PREVIDENT 5000 DRY MOUTH) 1.1 % GEL dental gel, use to brush teeth once daily at night, Disp: 56 g, Rfl: 4   Vitamin D, Ergocalciferol, (DRISDOL) 1.25 MG (50000 UNIT) CAPS capsule, Take 1 capsule by mouth once a  week for 12 weeks then take 2000 units otc daily, Disp: 12 capsule, Rfl: 0  Observations/Objective: Patient is well-developed, well-nourished in no acute distress.  Resting comfortably at home.  Head is normocephalic, atraumatic.  No labored breathing.  Speech is clear and coherent with logical content.  Patient is alert and oriented at baseline.    Assessment and Plan: 1. COVID-19 - molnupiravir EUA (LAGEVRIO) 200 mg CAPS capsule; Take 4 capsules (800 mg total) by mouth 2 (two) times daily for 5 days.  Dispense: 40 capsule; Refill: 0 - benzonatate (TESSALON) 100 MG capsule; Take 1 capsule (100 mg total) by mouth 3 (three) times daily as needed.  Dispense: 30 capsule; Refill: 0 - promethazine-dextromethorphan (PROMETHAZINE-DM) 6.25-15 MG/5ML syrup; Take 5 mLs by mouth 4 (four) times daily as needed.  Dispense: 118 mL; Refill: 0  - Continue OTC symptomatic management of choice - Will send OTC vitamins and supplement information through [facility] - Molnupiravir and tessalon perles and Promethazine DM prescribed - Patient enrolled in MyChart symptom monitoring - Push fluids - Rest as needed - Discussed return precautions and when to seek in-person evaluation, sent via [facility] as well  Follow Up Instructions: I discussed the assessment and treatment plan with the patient. The patient was provided an opportunity to ask questions and all were  answered. The patient agreed with the plan and demonstrated an understanding of the instructions.  A copy of instructions were sent to the patient via MyChart unless otherwise noted below.    The patient was advised to call back or seek an in-person evaluation if the symptoms worsen or if the condition fails to improve as anticipated.  Time:  I spent 12 minutes with the patient via telehealth technology discussing the above problems/concerns.    [name], PA-C

## 2021-04-06 NOTE — Patient Instructions (Signed)
Kyle Giles, thank you for joining [name], PA-C for today's virtual visit.  While this provider is not your primary care provider (PCP), if your PCP is located in our provider database this encounter information will be shared with them immediately following your visit.  Consent: (Patient) Kyle Giles provided verbal consent for this virtual visit at the beginning of the encounter.  Current Medications:  Current Outpatient Medications:    benzonatate (TESSALON) 100 MG capsule, Take 1 capsule (100 mg total) by mouth 3 (three) times daily as needed., Disp: 30 capsule, Rfl: 0   molnupiravir EUA (LAGEVRIO) 200 mg CAPS capsule, Take 4 capsules (800 mg total) by mouth 2 (two) times daily for 5 days., Disp: 40 capsule, Rfl: 0   promethazine-dextromethorphan (PROMETHAZINE-DM) 6.25-15 MG/5ML syrup, Take 5 mLs by mouth 4 (four) times daily as needed., Disp: 118 mL, Rfl: 0   acetaminophen (TYLENOL) 500 MG tablet, Take 500 mg by mouth every 6 (six) hours as needed for moderate pain or headache. Pain , Disp: , Rfl:    albuterol (VENTOLIN HFA) 108 (90 Base) MCG/ACT inhaler, Inhale 1 or 2 puffs every 4 hours as needed, Disp: 8.5 g, Rfl: 1   ALPRAZolam (XANAX) 1 MG tablet, Take 0.5-1 mg by mouth daily as needed for anxiety. , Disp: , Rfl:    ALPRAZolam (XANAX) 1 MG tablet, Take 1 tablet (1 mg total) by mouth 3 (three) times daily as needed., Disp: 270 tablet, Rfl: 0   ALPRAZolam (XANAX) 1 MG tablet, Take 1 tablet by mouth 3 times daily, Disp: 270 tablet, Rfl: 0   ALPRAZolam (XANAX) 1 MG tablet, Take 1 tablet by mouth 3 times a day, Disp: 270 tablet, Rfl: 0   amLODipine (NORVASC) 10 MG tablet, Take 10 mg by mouth every evening., Disp: , Rfl:    cetirizine (ZYRTEC) 10 MG tablet, Take 10 mg by mouth daily as needed for allergies., Disp: , Rfl:    cetirizine (ZYRTEC) 10 MG tablet, TAKE (1) TABLET BY MOUTH ONCE DAILY., Disp: 90 tablet, Rfl: 3   DENTA 5000 PLUS 1.1 % CREA dental cream, Take 1  application by mouth 2 (two) times daily., Disp: , Rfl:    diltiazem (CARDIZEM CD) 360 MG 24 hr capsule, Take 1 capsule (360 mg total) by mouth every evening., Disp: 90 capsule, Rfl: 3   fenofibrate 160 MG tablet, Take 160 mg by mouth daily.  , Disp: , Rfl:    fenofibrate 160 MG tablet, TAKE ONE TABLET BY MOUTH ONCE DAILY., Disp: 90 tablet, Rfl: 0   fenofibrate 160 MG tablet, Take 1 tablet by mouth daily, Disp: 90 tablet, Rfl: 2   ibuprofen (ADVIL) 200 MG tablet, Take 200 mg by mouth every 6 (six) hours as needed for headache or moderate pain., Disp: , Rfl:    metFORMIN (GLUCOPHAGE) 500 MG tablet, Take 500-1,000 mg by mouth See admin instructions. Take 500 mg in the morning and 1000 mg in the evening, Disp: , Rfl:    metFORMIN (GLUCOPHAGE) 500 MG tablet, TAKE 2 TABLETS BY MOUTH TWICE DAILY., Disp: 360 tablet, Rfl: 3   olmesartan (BENICAR) 40 MG tablet, Take 40 mg by mouth daily., Disp: , Rfl:    olmesartan (BENICAR) 40 MG tablet, TAKE 1 TABLET BY MOUTH ONCE DAILY., Disp: 90 tablet, Rfl: 1   olmesartan-hydrochlorothiazide (BENICAR HCT) 40-25 MG tablet, Take 1 tablet by mouth once a day, Disp: 90 tablet, Rfl: 1   PRESCRIPTION MEDICATION, Apply 1 application topically daily as needed (  pain). Compounded pain cream diclofenac 3%, baclofen 2%, gabapentin 5%, lidocaine 5%, menthol  1%, Disp: , Rfl:    Propylene Glycol (SYSTANE COMPLETE) 0.6 % SOLN, Place 1 drop into both eyes 2 (two) times daily., Disp: , Rfl:    simvastatin (ZOCOR) 20 MG tablet, Take 20 mg by mouth every evening. , Disp: , Rfl:    simvastatin (ZOCOR) 20 MG tablet, TAKE 1 TABLET BY MOUTH ONCE DAILY., Disp: 90 tablet, Rfl: 1   sodium fluoride (PREVIDENT 5000 DRY MOUTH) 1.1 % GEL dental gel, use to brush teeth once daily at night, Disp: 56 g, Rfl: 4   Vitamin D, Ergocalciferol, (DRISDOL) 1.25 MG (50000 UNIT) CAPS capsule, Take 1 capsule by mouth once a week for 12 weeks then take 2000 units otc daily, Disp: 12 capsule, Rfl: 0   Medications  ordered in this encounter:  Meds ordered this encounter  Medications   molnupiravir EUA (LAGEVRIO) 200 mg CAPS capsule    Sig: Take 4 capsules (800 mg total) by mouth 2 (two) times daily for 5 days.    Dispense:  40 capsule    Refill:  0    Order Specific Question:   Supervising Provider    Answer:   MILLER, BRIAN [[id number]]   benzonatate (TESSALON) 100 MG capsule    Sig: Take 1 capsule (100 mg total) by mouth 3 (three) times daily as needed.    Dispense:  30 capsule    Refill:  0    Order Specific Question:   Supervising Provider    Answer:   Noemi Chapel [[id number]]   promethazine-dextromethorphan (PROMETHAZINE-DM) 6.25-15 MG/5ML syrup    Sig: Take 5 mLs by mouth 4 (four) times daily as needed.    Dispense:  118 mL    Refill:  0    Order Specific Question:   Supervising Provider    Answer:   Sabra Heck, BRIAN [[id number]]     *If you need refills on other medications prior to your next appointment, please contact your pharmacy*  Follow-Up: Call back or seek an in-person evaluation if the symptoms worsen or if the condition fails to improve as anticipated.  Other Instructions Molnupiravir Oral Capsules What is this medication? MOLNUPIRAVIR (mol nue pir a vir) treats COVID-19. It is an antiviral medication. It may decrease the risk of developing severe symptoms of COVID-19. It may also decrease the chance of Giles to the hospital. This medication is not approved by the FDA. The FDA has authorized emergency use of this medication during the COVID-19 pandemic. This medicine may be used for other purposes; ask your health care provider or pharmacist if you have questions. COMMON BRAND NAME(S): LAGEVRIO What should I tell my care team before I take this medication? They need to know if you have any of these conditions: Any allergies Any serious illness An unusual or allergic reaction to molnupiravir, other medications, foods, dyes, or preservatives Pregnant or trying to get  pregnant Breast-feeding How should I use this medication? Take this medication by mouth with water. Take it as directed on the prescription label at the same time every day. Do not cut, crush or chew this medication. Swallow the capsules whole. You can take it with or without food. If it upsets your stomach, take it with food. Take all of this medication unless your care team tells you to stop it early. Keep taking it even if you think you are better. Talk to your care team about the use of this medication in children.  Special care may be needed. Overdosage: If you think you have taken too much of this medicine contact a poison control center or emergency room at once. NOTE: This medicine is only for you. Do not share this medicine with others. What if I miss a dose? If you miss a dose, take it as soon as you can unless it is more than 10 hours late. If it is more than 10 hours late, skip the missed dose. Take the next dose at the normal time. Do not take extra or 2 doses at the same time to make up for the missed dose. What may interact with this medication? Interactions have not been studied. This list may not describe all possible interactions. Give your health care provider a list of all the medicines, herbs, non-prescription drugs, or dietary supplements you use. Also tell them if you smoke, drink alcohol, or use illegal drugs. Some items may interact with your medicine. What should I watch for while using this medication? Your condition will be monitored carefully while you are receiving this medication. Visit your care team for regular checkups. Tell your care team if your symptoms do not start to get better or if they get worse. Do not become pregnant while taking this medication. You may need a pregnancy test before starting this medication. Women must use a reliable form of birth control while taking this medication and for 4 days after stopping the medication. Women should inform their care  team if they wish to become pregnant or think they might be pregnant. Men should not father a child while taking this medication and for 3 months after stopping it. There is potential for serious harm to an unborn child. Talk to your care team for more information. Do not breast-feed an infant while taking this medication and for 4 days after stopping the medication. What side effects may I notice from receiving this medication? Side effects that you should report to your care team as soon as possible: Allergic reactions--skin rash, itching, hives, swelling of the face, lips, tongue, or throat Side effects that usually do not require medical attention (report these to your care team if they continue or are bothersome): Diarrhea Dizziness Nausea This list may not describe all possible side effects. Call your doctor for medical advice about side effects. You may report side effects to FDA at 1-800-FDA-1088. Where should I keep my medication? Keep out of the reach of children and pets. Store at room temperature between 20 and 25 degrees C (68 and 77 degrees F). Get rid of any unused medication after the expiration date. To get rid of medications that are no longer needed or have expired: Take the medication to a medication take-back program. Check with your pharmacy or law enforcement to find a location. If you cannot return the medication, check the label or package insert to see if the medication should be thrown out in the garbage or flushed down the toilet. If you are not sure, ask your care team. If it is safe to put it in the trash, take the medication out of the container. Mix the medication with cat litter, dirt, coffee grounds, or other unwanted substance. Seal the mixture in a bag or container. Put it in the trash. NOTE: This sheet is a summary. It may not cover all possible information. If you have questions about this medicine, talk to your doctor, pharmacist, or health care provider.  2022  Elsevier/Gold Standard (2020-01-31 00:00:00)    If you  have been instructed to have an in-person evaluation today at a local Urgent Care facility, please use the link below. It will take you to a list of all of our available [facility], including address, phone number and hours of operation. Please do not delay care.  [facility]  If you or a family member do not have a primary care provider, use the link below to schedule a visit and establish care. When you choose a [facility] primary care physician or advanced practice provider, you gain a long-term partner in health. Find a Primary Care Provider  Learn more about [facility] in-office and virtual care options: [facility]

## 2021-04-15 ENCOUNTER — Other Ambulatory Visit (HOSPITAL_COMMUNITY): Payer: Self-pay

## 2021-04-16 ENCOUNTER — Other Ambulatory Visit (HOSPITAL_COMMUNITY): Payer: Self-pay

## 2021-04-17 ENCOUNTER — Other Ambulatory Visit: Payer: Self-pay

## 2021-04-17 ENCOUNTER — Ambulatory Visit (HOSPITAL_COMMUNITY)
Admission: RE | Admit: 2021-04-17 | Discharge: 2021-04-17 | Disposition: A | Discharge status: Home / Self Care | Payer: 59 | Source: Ambulatory Visit | Attending: Family Medicine | Admitting: Family Medicine

## 2021-04-17 ENCOUNTER — Other Ambulatory Visit (HOSPITAL_COMMUNITY): Payer: Self-pay | Admitting: Family Medicine

## 2021-04-17 DIAGNOSIS — U099 Post covid-19 condition, unspecified: Secondary | ICD-10-CM | POA: Insufficient documentation

## 2021-04-17 DIAGNOSIS — U071 COVID-19: Secondary | ICD-10-CM | POA: Diagnosis not present

## 2021-04-17 DIAGNOSIS — Z6823 Body mass index (BMI) 23.0-23.9, adult: Secondary | ICD-10-CM | POA: Diagnosis not present

## 2021-04-17 DIAGNOSIS — J209 Acute bronchitis, unspecified: Secondary | ICD-10-CM | POA: Diagnosis not present

## 2021-04-17 DIAGNOSIS — E663 Overweight: Secondary | ICD-10-CM | POA: Diagnosis not present

## 2021-04-21 ENCOUNTER — Encounter (HOSPITAL_COMMUNITY): Payer: Self-pay | Admitting: *Deleted

## 2021-04-21 ENCOUNTER — Emergency Department (HOSPITAL_COMMUNITY): Payer: 59

## 2021-04-21 ENCOUNTER — Inpatient Hospital Stay (HOSPITAL_COMMUNITY)
Admission: EM | Admit: 2021-04-21 | Discharge: 2021-04-25 | DRG: 439 | Disposition: A | Discharge status: Home / Self Care | Payer: 59 | Attending: Internal Medicine | Admitting: Internal Medicine

## 2021-04-21 ENCOUNTER — Other Ambulatory Visit: Payer: Self-pay

## 2021-04-21 DIAGNOSIS — K219 Gastro-esophageal reflux disease without esophagitis: Secondary | ICD-10-CM | POA: Diagnosis present

## 2021-04-21 DIAGNOSIS — Z7984 Long term (current) use of oral hypoglycemic drugs: Secondary | ICD-10-CM | POA: Diagnosis not present

## 2021-04-21 DIAGNOSIS — F10239 Alcohol dependence with withdrawal, unspecified: Secondary | ICD-10-CM | POA: Diagnosis not present

## 2021-04-21 DIAGNOSIS — K7010 Alcoholic hepatitis without ascites: Secondary | ICD-10-CM | POA: Diagnosis present

## 2021-04-21 DIAGNOSIS — R011 Cardiac murmur, unspecified: Secondary | ICD-10-CM | POA: Diagnosis present

## 2021-04-21 DIAGNOSIS — K852 Alcohol induced acute pancreatitis without necrosis or infection: Secondary | ICD-10-CM | POA: Diagnosis not present

## 2021-04-21 DIAGNOSIS — E7800 Pure hypercholesterolemia, unspecified: Secondary | ICD-10-CM | POA: Diagnosis present

## 2021-04-21 DIAGNOSIS — N179 Acute kidney failure, unspecified: Secondary | ICD-10-CM | POA: Diagnosis present

## 2021-04-21 DIAGNOSIS — Z8616 Personal history of COVID-19: Secondary | ICD-10-CM

## 2021-04-21 DIAGNOSIS — R1084 Generalized abdominal pain: Secondary | ICD-10-CM | POA: Diagnosis not present

## 2021-04-21 DIAGNOSIS — K2980 Duodenitis without bleeding: Secondary | ICD-10-CM | POA: Diagnosis present

## 2021-04-21 DIAGNOSIS — I10 Essential (primary) hypertension: Secondary | ICD-10-CM | POA: Diagnosis present

## 2021-04-21 DIAGNOSIS — R443 Hallucinations, unspecified: Secondary | ICD-10-CM | POA: Diagnosis present

## 2021-04-21 DIAGNOSIS — K760 Fatty (change of) liver, not elsewhere classified: Secondary | ICD-10-CM | POA: Diagnosis not present

## 2021-04-21 DIAGNOSIS — F1010 Alcohol abuse, uncomplicated: Secondary | ICD-10-CM | POA: Diagnosis present

## 2021-04-21 DIAGNOSIS — I129 Hypertensive chronic kidney disease with stage 1 through stage 4 chronic kidney disease, or unspecified chronic kidney disease: Secondary | ICD-10-CM | POA: Diagnosis not present

## 2021-04-21 DIAGNOSIS — R079 Chest pain, unspecified: Secondary | ICD-10-CM | POA: Diagnosis not present

## 2021-04-21 DIAGNOSIS — K859 Acute pancreatitis without necrosis or infection, unspecified: Secondary | ICD-10-CM | POA: Diagnosis not present

## 2021-04-21 DIAGNOSIS — F419 Anxiety disorder, unspecified: Secondary | ICD-10-CM | POA: Diagnosis present

## 2021-04-21 DIAGNOSIS — R109 Unspecified abdominal pain: Secondary | ICD-10-CM | POA: Diagnosis not present

## 2021-04-21 DIAGNOSIS — R059 Cough, unspecified: Secondary | ICD-10-CM | POA: Diagnosis not present

## 2021-04-21 DIAGNOSIS — Z79899 Other long term (current) drug therapy: Secondary | ICD-10-CM | POA: Diagnosis not present

## 2021-04-21 DIAGNOSIS — R112 Nausea with vomiting, unspecified: Secondary | ICD-10-CM

## 2021-04-21 DIAGNOSIS — F17210 Nicotine dependence, cigarettes, uncomplicated: Secondary | ICD-10-CM | POA: Diagnosis present

## 2021-04-21 DIAGNOSIS — Z720 Tobacco use: Secondary | ICD-10-CM | POA: Diagnosis not present

## 2021-04-21 DIAGNOSIS — R0989 Other specified symptoms and signs involving the circulatory and respiratory systems: Secondary | ICD-10-CM | POA: Diagnosis not present

## 2021-04-21 DIAGNOSIS — E119 Type 2 diabetes mellitus without complications: Secondary | ICD-10-CM | POA: Diagnosis not present

## 2021-04-21 DIAGNOSIS — R000 Tachycardia, unspecified: Secondary | ICD-10-CM | POA: Diagnosis not present

## 2021-04-21 LAB — URINALYSIS, ROUTINE W REFLEX MICROSCOPIC
Bacteria, UA: NONE SEEN
Bilirubin Urine: NEGATIVE
Glucose, UA: >=500 mg/dL — AB
Hgb urine dipstick: NEGATIVE
Ketones, ur: NEGATIVE mg/dL
Leukocytes,Ua: NEGATIVE
Nitrite: NEGATIVE
Protein, ur: NEGATIVE mg/dL
Specific Gravity, Urine: 1.03 (ref 1.005–1.030)
pH: 6 (ref 5.0–8.0)

## 2021-04-21 LAB — COMPREHENSIVE METABOLIC PANEL
ALT: 45 U/L — ABNORMAL HIGH (ref 0–44)
AST: 47 U/L — ABNORMAL HIGH (ref 15–41)
Albumin: 4.6 g/dL (ref 3.5–5.0)
Alkaline Phosphatase: 33 U/L — ABNORMAL LOW (ref 38–126)
Anion gap: 15 (ref 5–15)
BUN: 35 mg/dL — ABNORMAL HIGH (ref 8–23)
CO2: 25 mmol/L (ref 22–32)
Calcium: 12.3 mg/dL — ABNORMAL HIGH (ref 8.9–10.3)
Chloride: 94 mmol/L — ABNORMAL LOW (ref 98–111)
Creatinine, Ser: 1.73 mg/dL — ABNORMAL HIGH (ref 0.61–1.24)
GFR, Estimated: 44 mL/min — ABNORMAL LOW (ref >60)
Glucose, Bld: 243 mg/dL — ABNORMAL HIGH (ref 70–99)
Potassium: 4.2 mmol/L (ref 3.5–5.1)
Sodium: 134 mmol/L — ABNORMAL LOW (ref 135–145)
Total Bilirubin: 0.8 mg/dL (ref 0.3–1.2)
Total Protein: 8 g/dL (ref 6.5–8.1)

## 2021-04-21 LAB — CBC WITH DIFFERENTIAL/PLATELET
Abs Immature Granulocytes: 0.1 10*3/uL — ABNORMAL HIGH (ref 0.00–0.07)
Basophils Absolute: 0 10*3/uL (ref 0.0–0.1)
Basophils Relative: 0 %
Eosinophils Absolute: 0.1 10*3/uL (ref 0.0–0.5)
Eosinophils Relative: 0 %
HCT: 46.3 % (ref 39.0–52.0)
Hemoglobin: 15 g/dL (ref 13.0–17.0)
Immature Granulocytes: 1 %
Lymphocytes Relative: 4 %
Lymphs Abs: 0.7 10*3/uL (ref 0.7–4.0)
MCH: 30.4 pg (ref 26.0–34.0)
MCHC: 32.4 g/dL (ref 30.0–36.0)
MCV: 93.9 fL (ref 80.0–100.0)
Monocytes Absolute: 1.5 10*3/uL — ABNORMAL HIGH (ref 0.1–1.0)
Monocytes Relative: 7 %
Neutro Abs: 17.7 10*3/uL — ABNORMAL HIGH (ref 1.7–7.7)
Neutrophils Relative %: 88 %
Platelets: 455 10*3/uL — ABNORMAL HIGH (ref 150–400)
RBC: 4.93 MIL/uL (ref 4.22–5.81)
RDW: 14 % (ref 11.5–15.5)
WBC: 20.1 10*3/uL — ABNORMAL HIGH (ref 4.0–10.5)
nRBC: 0 % (ref 0.0–0.2)

## 2021-04-21 LAB — PROTIME-INR
INR: 1 (ref 0.8–1.2)
Prothrombin Time: 12.7 s (ref 11.4–15.2)

## 2021-04-21 LAB — LACTIC ACID, PLASMA
Lactic Acid, Venous: 2.5 mmol/L (ref 0.5–1.9)
Lactic Acid, Venous: 3.5 mmol/L (ref 0.5–1.9)

## 2021-04-21 LAB — APTT: aPTT: 22 seconds — ABNORMAL LOW (ref 24–36)

## 2021-04-21 LAB — CBG MONITORING, ED: Glucose-Capillary: 213 mg/dL — ABNORMAL HIGH (ref 70–99)

## 2021-04-21 MED ORDER — LORAZEPAM 2 MG/ML IJ SOLN: 1.0000 mg | INTRAMUSCULAR | Status: AC | PRN

## 2021-04-21 MED ORDER — SODIUM CHLORIDE 0.9 % IV BOLUS
2000.0000 mL | Freq: Once | INTRAVENOUS | Status: AC
  Administered 2021-04-21: 2000 mL via INTRAVENOUS

## 2021-04-21 MED ORDER — ADULT MULTIVITAMIN W/MINERALS CH
1.0000 | ORAL_TABLET | Freq: Every day | ORAL | Status: DC
  Administered 2021-04-22 – 2021-04-25 (×4): 1 via ORAL
  Filled 2021-04-21 (×5): qty 1

## 2021-04-21 MED ORDER — FOLIC ACID 1 MG PO TABS
1.0000 mg | ORAL_TABLET | Freq: Every day | ORAL | Status: DC
  Administered 2021-04-22 – 2021-04-25 (×4): 1 mg via ORAL
  Filled 2021-04-21 (×4): qty 1

## 2021-04-21 MED ORDER — AMLODIPINE BESYLATE 5 MG PO TABS
10.0000 mg | ORAL_TABLET | Freq: Every day | ORAL | Status: DC
  Administered 2021-04-22 – 2021-04-23 (×2): 10 mg via ORAL
  Filled 2021-04-21 (×3): qty 2

## 2021-04-21 MED ORDER — SODIUM CHLORIDE 0.9% FLUSH
3.0000 mL | Freq: Two times a day (BID) | INTRAVENOUS | Status: DC
  Administered 2021-04-22 – 2021-04-24 (×4): 3 mL via INTRAVENOUS

## 2021-04-21 MED ORDER — DIAZEPAM 2 MG PO TABS
2.0000 mg | ORAL_TABLET | Freq: Three times a day (TID) | ORAL | Status: AC
  Administered 2021-04-21 – 2021-04-23 (×6): 2 mg via ORAL
  Filled 2021-04-21 (×6): qty 1

## 2021-04-21 MED ORDER — ACETAMINOPHEN 325 MG PO TABS: 650.0000 mg | ORAL_TABLET | Freq: Four times a day (QID) | ORAL | Status: DC | PRN

## 2021-04-21 MED ORDER — SODIUM CHLORIDE 0.9 % IV SOLN: 250.0000 mL | INTRAVENOUS | Status: DC | PRN

## 2021-04-21 MED ORDER — THIAMINE HCL 100 MG/ML IJ SOLN
100.0000 mg | Freq: Every day | INTRAMUSCULAR | Status: DC
  Filled 2021-04-21: qty 2

## 2021-04-21 MED ORDER — SODIUM CHLORIDE 0.9 % IV BOLUS
500.0000 mL | Freq: Once | INTRAVENOUS | Status: AC
  Administered 2021-04-21: 500 mL via INTRAVENOUS

## 2021-04-21 MED ORDER — ALBUTEROL SULFATE (2.5 MG/3ML) 0.083% IN NEBU
2.5000 mg | INHALATION_SOLUTION | RESPIRATORY_TRACT | Status: DC | PRN
Start: 2021-04-21 — End: 2021-04-25
  Administered 2021-04-24: 2.5 mg via RESPIRATORY_TRACT
  Filled 2021-04-21: qty 3

## 2021-04-21 MED ORDER — SODIUM CHLORIDE 0.9% FLUSH: 3.0000 mL | INTRAVENOUS | Status: DC | PRN

## 2021-04-21 MED ORDER — HEPARIN SODIUM (PORCINE) 5000 UNIT/ML IJ SOLN
5000.0000 [IU] | Freq: Three times a day (TID) | INTRAMUSCULAR | Status: DC
  Administered 2021-04-21 – 2021-04-25 (×10): 5000 [IU] via SUBCUTANEOUS
  Filled 2021-04-21 (×10): qty 1

## 2021-04-21 MED ORDER — FENTANYL CITRATE PF 50 MCG/ML IJ SOSY
50.0000 ug | PREFILLED_SYRINGE | Freq: Once | INTRAMUSCULAR | Status: AC
  Administered 2021-04-21: 50 ug via INTRAVENOUS
  Filled 2021-04-21: qty 1

## 2021-04-21 MED ORDER — SODIUM CHLORIDE 0.9 % IV BOLUS
1000.0000 mL | Freq: Once | INTRAVENOUS | Status: AC
  Administered 2021-04-22: 1000 mL via INTRAVENOUS

## 2021-04-21 MED ORDER — NICOTINE 21 MG/24HR TD PT24
21.0000 mg | MEDICATED_PATCH | Freq: Every day | TRANSDERMAL | Status: DC
  Administered 2021-04-22 – 2021-04-24 (×3): 21 mg via TRANSDERMAL
  Filled 2021-04-21 (×4): qty 1

## 2021-04-21 MED ORDER — KETOROLAC TROMETHAMINE 30 MG/ML IJ SOLN: 30.0000 mg | Freq: Four times a day (QID) | INTRAMUSCULAR | Status: DC | PRN

## 2021-04-21 MED ORDER — SODIUM CHLORIDE 0.9 % IV SOLN: INTRAVENOUS | Status: DC

## 2021-04-21 MED ORDER — INSULIN ASPART 100 UNIT/ML IJ SOLN
0.0000 [IU] | Freq: Every day | INTRAMUSCULAR | Status: DC
  Administered 2021-04-21: 2 [IU] via SUBCUTANEOUS
  Filled 2021-04-21: qty 1

## 2021-04-21 MED ORDER — ONDANSETRON HCL 4 MG/2ML IJ SOLN: 4.0000 mg | Freq: Four times a day (QID) | INTRAMUSCULAR | Status: DC | PRN

## 2021-04-21 MED ORDER — CARVEDILOL 3.125 MG PO TABS
6.2500 mg | ORAL_TABLET | Freq: Two times a day (BID) | ORAL | Status: DC
  Administered 2021-04-21 – 2021-04-25 (×7): 6.25 mg via ORAL
  Filled 2021-04-21 (×8): qty 2

## 2021-04-21 MED ORDER — ACETAMINOPHEN 650 MG RE SUPP: 650.0000 mg | Freq: Four times a day (QID) | RECTAL | Status: DC | PRN

## 2021-04-21 MED ORDER — OXYCODONE HCL 5 MG PO TABS
5.0000 mg | ORAL_TABLET | ORAL | Status: DC | PRN
  Administered 2021-04-21 – 2021-04-24 (×3): 5 mg via ORAL
  Filled 2021-04-21 (×4): qty 1

## 2021-04-21 MED ORDER — POLYETHYLENE GLYCOL 3350 17 G PO PACK: 17.0000 g | PACK | Freq: Every day | ORAL | Status: DC | PRN

## 2021-04-21 MED ORDER — FENTANYL CITRATE PF 50 MCG/ML IJ SOSY
25.0000 ug | PREFILLED_SYRINGE | Freq: Once | INTRAMUSCULAR | Status: AC
  Administered 2021-04-21: 25 ug via INTRAVENOUS
  Filled 2021-04-21: qty 1

## 2021-04-21 MED ORDER — BISACODYL 10 MG RE SUPP: 10.0000 mg | Freq: Every day | RECTAL | Status: DC | PRN

## 2021-04-21 MED ORDER — KETOROLAC TROMETHAMINE 30 MG/ML IJ SOLN: 15.0000 mg | Freq: Four times a day (QID) | INTRAMUSCULAR | Status: DC | PRN

## 2021-04-21 MED ORDER — LABETALOL HCL 5 MG/ML IV SOLN
10.0000 mg | INTRAVENOUS | Status: DC | PRN
  Administered 2021-04-21 – 2021-04-22 (×2): 10 mg via INTRAVENOUS
  Filled 2021-04-21: qty 4

## 2021-04-21 MED ORDER — ONDANSETRON HCL 4 MG PO TABS: 4.0000 mg | ORAL_TABLET | Freq: Four times a day (QID) | ORAL | Status: DC | PRN

## 2021-04-21 MED ORDER — LORAZEPAM 1 MG PO TABS
1.0000 mg | ORAL_TABLET | ORAL | Status: AC | PRN
  Administered 2021-04-21 – 2021-04-24 (×3): 1 mg via ORAL
  Administered 2021-04-24: 2 mg via ORAL
  Administered 2021-04-24 (×2): 1 mg via ORAL
  Filled 2021-04-21 (×6): qty 1
  Filled 2021-04-21: qty 2

## 2021-04-21 MED ORDER — SODIUM CHLORIDE 0.9% FLUSH
3.0000 mL | Freq: Two times a day (BID) | INTRAVENOUS | Status: DC
  Administered 2021-04-22 – 2021-04-25 (×6): 3 mL via INTRAVENOUS

## 2021-04-21 MED ORDER — THIAMINE HCL 100 MG PO TABS
100.0000 mg | ORAL_TABLET | Freq: Every day | ORAL | Status: DC
  Administered 2021-04-22 – 2021-04-25 (×4): 100 mg via ORAL
  Filled 2021-04-21 (×4): qty 1

## 2021-04-21 MED ORDER — INSULIN ASPART 100 UNIT/ML IJ SOLN: 0.0000 [IU] | Freq: Three times a day (TID) | INTRAMUSCULAR | Status: DC

## 2021-04-21 MED ORDER — IOHEXOL 300 MG/ML  SOLN
75.0000 mL | Freq: Once | INTRAMUSCULAR | Status: AC | PRN
  Administered 2021-04-21: 75 mL via INTRAVENOUS

## 2021-04-21 MED ORDER — HYDROMORPHONE HCL 1 MG/ML IJ SOLN
1.0000 mg | INTRAMUSCULAR | Status: DC | PRN
  Administered 2021-04-21 – 2021-04-23 (×9): 1 mg via INTRAVENOUS
  Filled 2021-04-21 (×9): qty 1

## 2021-04-21 NOTE — ED Notes (Signed)
Gave urinal to provide sample. Educated to call when done.  ?

## 2021-04-21 NOTE — H&P (Signed)
?                                                                                           ? ? ? ? Patient Demographics:  ? ? ?Kyle Giles, is a 61 y.o. male  MRN: [id number]   DOB - [birth year] ? ?Admit Date - 04/21/2021 ? ?Outpatient Primary MD for the patient is Sharilyn Sites, MD ? ? Assessment & Plan:  ? ?Assessment and Plan: ? ?1) acute alcoholic pancreatitis--- ?-Abdominal pain, nausea, lipase 1221 ?CT AP showed- Acute interstitial/edematous pancreatitis superimposed on chronic pancreatitis with extensive acute peripancreatic inflammatory fluid. ?Relative hypoenhancement of the body and tail the pancreas without frank pancreatic necrosis identified. ?-N.p.o. status except for sips with meds ?-IV fluids aggressively ?-IV pain medications and antiemetics ? ?2) heavy alcohol abuse--- patient drinks more than 6 beers per day in addition to vodka and hard liquor ?--Very high risk for DTs ?-Folic acid thiamine and multivitamin as ordered ?-Lorazepam per CIWA protocol ? ?3)Moderate Hepatic Steatosis--suspect related to EtOH abuse ?-Patient is not interested in alcohol rehab ? ?4)HTN--severely elevated BP, query lack of compliance with medications and DTs as well ?-Restart amlodipine 10 mg daily ?-Added Coreg ?-May use IV labetalol as needed severe elevated BP ? ?5)Tobacco Abuse--smoking cessation advised, nicotine patch as ordered ? ?6) duodenitis----suspect alcohol-related, see CTAP report ?-IV Protonix as ordered ?-Management as above #1 ?-Consider GI input especially if GI bleed concerns ? ?7)DM2--check A1c ?-Hold metformin ?Use Novolog/Humalog Sliding scale insulin with Accu-Cheks/Fingersticks as ordered  ? ?8) lactic acidosis--suspect due to dehydration in the setting of acute pancreatitis compounded by alcoholic ketosis ?-Clinically no evidence for sepsis ?-Continue aggressive IV fluids ? ?9) elevated LFTs--- ratio of AST to ALT is not  consistent with alcoholic hepatitis ?-Check viral hepatitis profile ? ?10)AKI----acute kidney injury - ?-It is up to 1.73 previously normal around 0.7--suspect dehydration related to acute pancreatitis with third spacing ?-Continue aggressive IV fluid ?-Hold metformin, ?-Stop olmesartan/HCTZ ?-Stop NSAIDs ? renally adjust medications, avoid nephrotoxic agents / dehydration  / hypotension ? ?11) tested positive for COVID on 04/06/2021----no acute COVID type symptoms at this time chest x-ray unremarkable ?-Supportive care ? ? ?Disposition/Need for in-Hospital Stay- patient unable to be discharged at this time due to -acute alcoholic pancreatitis requiring IV fluids and antiemetics and pain medication- ?-- High risk for DTs ? ?Status is: Inpatient ?Dispo: The patient is from: Home ?             Anticipated d/c is to: Home ?             Anticipated d/c date is: 2 days ?             Patient currently is not medically stable to d/c. ?Barriers: Not Clinically Stable- ?With History of - ?Reviewed by me ? ?Past Medical History:  ?Diagnosis Date  ? Anxiety   ? DM type 2 (diabetes mellitus, type 2) ([place])   ? GERD (gastroesophageal reflux disease)   ? Heart murmur   ? saw dr Willeen Cass 06-29-2019 echo done   ? Hypercholesterolemia   ? Hypertension   ? Palpitations   ?  none in a long time as of 10-19-2019  ? S/P colonoscopy August 2011  ? Dr. Arnoldo Morale: Moderate diverticulosis throughout colon, otherwise normal   ? Umbilical hernia   ?   ? ?Past Surgical History:  ?Procedure Laterality Date  ? COLONOSCOPY N/A 12/21/2019  ? Procedure: COLONOSCOPY;  Surgeon: Aviva Signs, MD;  [facility]: [facility];  Service: Gastroenterology;  Laterality: N/A;  ? cyst removed from finger  age 80  ? HERNIA REPAIR    ? as baby  ? POLYPECTOMY  12/21/2019  ? Procedure: POLYPECTOMY;  Surgeon: Aviva Signs, MD;  Location: [facility];  Service: Gastroenterology;;  ? pyloric stenosis repair    ? as baby, operated at [facility]  ? UMBILICAL HERNIA REPAIR N/A  10/29/2019  ? Procedure: OPEN UMBILICAL HERNIA REPAIR WITH MESH;  Surgeon: Clovis Riley, MD;  Location: [facility];  Service: General;  Laterality: N/A;  ? ? ? ? ?Chief Complaint  ?Patient presents with  ? Abdominal Pain  ?  ? ? HPI:  ? ? Kyle Giles  is a 61 y.o. male with past medical history relevant for longstanding history of alcohol and tobacco abuse, HTN, HLD, anxiety disorder DM2 who presents to the ED with worsening abdominal pain and nausea ?-No emesis no diarrhea ?-Patient was recently diagnosed with bronchitis and was taking amoxicillin ?-Also tested positive for COVID on 04/06/2021 ?-Additional history obtained from patient's wife Opal Sidles at bedside ?CT AP showed Acute interstitial/edematous pancreatitis superimposed on chronic pancreatitis with extensive acute peripancreatic inflammatory fluid. ?Relative hypoenhancement of the body and tail the pancreas without frank pancreatic necrosis identified. ? Heterogeneous enhancement of the second portion of the duodenum likely related to edema and secondary duodenitis related to the adjacent inflammatory process. No evidence of obstruction or ?perforation. ? Moderate hepatic steatosis. ?-Chest x-ray without acute finding ?-In the ED INR 1.0 ?-Lipase 1221, lactic acid elevated at two-point 5 repeat 3.5 ?-AST 47 ALT 45 alk phos 33 ?-Creatinine is up to 1.73 with a GFR of 43 sodium 134 glucose 243 and potassium 4.2 ?- ? ? Review of systems:  ?  ?In addition to the HPI above,  ? ?A full Review of  Systems was done, all other systems reviewed are negative except as noted above in HPI , . ? ? ? Social History:  ?Reviewed by me ? ?  ?Social History  ? ?Tobacco Use  ? Smoking status: Every Day  ?  Packs/day: 1.50  ?  Years: 30.00  ?  Pack years: 45.00  ?  Types: Cigarettes  ? Smokeless tobacco: Never  ? Tobacco comments:  ?  1 ppd since 3 weeks as of 10-19-2019  ?Substance Use Topics  ? Alcohol use: Yes  ?  Comment: a few beers in the evening  ? ? ?  Family History :  ?Reviewed by me ? ?  ?Family History  ?Problem Relation Age of Onset  ? Colon cancer Neg Hx   ? ? ? Home Medications:  ? ?Prior to Admission medications   ?Medication Sig Start Date End Date Taking? Authorizing Provider  ?acetaminophen (TYLENOL) 500 MG tablet Take 500 mg by mouth every 6 (six) hours as needed for moderate pain or headache. Pain     [provider]  ?albuterol (VENTOLIN HFA) 108 (90 Base) MCG/ACT inhaler Inhale 1 or 2 puffs every 4 hours as needed 12/06/20     ?ALPRAZolam (XANAX) 1 MG tablet Take 0.5-1 mg by mouth daily as needed for anxiety.  [provider]  ?ALPRAZolam Duanne Moron) 1 MG tablet Take 1 tablet (1 mg total) by mouth 3 (three) times daily as needed. 08/04/20     ?ALPRAZolam (XANAX) 1 MG tablet Take 1 tablet by mouth 3 times daily 11/13/20     ?ALPRAZolam (XANAX) 1 MG tablet Take 1 tablet by mouth 3 times a day 02/19/21     ?amLODipine (NORVASC) 10 MG tablet Take 10 mg by mouth every evening.    [provider]  ?benzonatate (TESSALON) 100 MG capsule Take 1 capsule (100 mg total) by mouth 3 (three) times daily as needed. 04/06/21   [name], PA-C  ?cetirizine (ZYRTEC) 10 MG tablet Take 10 mg by mouth daily as needed for allergies. 09/23/19   [provider]  ?cetirizine (ZYRTEC) 10 MG tablet TAKE (1) TABLET BY MOUTH ONCE DAILY. 08/04/20     ?DENTA 5000 PLUS 1.1 % CREA dental cream Take 1 application by mouth 2 (two) times daily. 11/01/19   [provider]  ?diltiazem (CARDIZEM CD) 360 MG 24 hr capsule Take 1 capsule (360 mg total) by mouth every evening. 08/04/20     ?fenofibrate 160 MG tablet Take 160 mg by mouth daily.      [provider]  ?fenofibrate 160 MG tablet TAKE ONE TABLET BY MOUTH ONCE DAILY. 08/04/20     ?fenofibrate 160 MG tablet Take 1 tablet by mouth daily 11/13/20     ?ibuprofen (ADVIL) 200 MG tablet Take 200 mg by mouth every 6 (six) hours as needed for headache or moderate pain.    [provider]  ?metFORMIN (GLUCOPHAGE) 500 MG tablet Take 500-1,000 mg by mouth See admin instructions. Take 500 mg in the morning and 1000 mg in the evening    [provider]  ?metFORMIN (GLUCOPHAGE

## 2021-04-21 NOTE — ED Triage Notes (Signed)
Pt with abd pain since taking Amoxicillin on last Tuesday.  Denies any N/V/D, LBM 2 days ago per pt. Taking amoxicillin for bronchitis and + for Covid on 04/06/21.  ?

## 2021-04-21 NOTE — ED Provider Notes (Addendum)
?[name] ?Provider Note ? ? ?CSN: [id number] ?Arrival date & time: 04/21/21  1746 ? ?  ? ?History ? ?Chief Complaint  ?Patient presents with  ? Abdominal Pain  ? ? ?Kyle Giles is a 61 y.o. male with a history of diverticulosis, GERD, HTN, diabetes mellitus type 2 presenting today with two days of diffuse abdominal pain shortly after starting course of prednisone and amoxicillin.  Patient originally diagnosed with COVID on 04/06/2021 and started taking antiviral and cough suppressant.  Symptoms remained, so the patient followed back up and was started on amoxicillin and prednisone for possible other causes of URI.  Began experiencing abdominal pain within a few hours of taking the amoxicillin and prednisone.  Pain described as constant, sharp, and worse with palpation.  Has not been able to pass a bowel movement in the last 2 days.  Had 1 episode of vomiting this morning.  Denies fever, headache, vision changes, lightheadedness, dizziness, SOB, or focal deficit.  Denies urinary symptoms, back pain, or diarrhea.  Endorses recent change in blood pressure medication.  Normally has high blood pressure, but not as high as today.  Denies GERD symptoms.  Admits to chronic alcohol use of about 6 beers/day. ? ?Hx of umbilical hernia repair.  Hx of pyloric stenosis repair as infant.  Not on anticoagulation. ? ?The history is provided by the patient, medical records and the spouse.  ?Abdominal Pain ?Associated symptoms: constipation and vomiting   ? ?  ? ?Home Medications ?Prior to Admission medications   ?Medication Sig Start Date End Date Taking? Authorizing Provider  ?acetaminophen (TYLENOL) 500 MG tablet Take 500 mg by mouth every 6 (six) hours as needed for moderate pain or headache. Pain     [provider]  ?albuterol (VENTOLIN HFA) 108 (90 Base) MCG/ACT inhaler Inhale 1 or 2 puffs every 4 hours as needed 12/06/20     ?ALPRAZolam (XANAX) 1 MG tablet Take 0.5-1 mg by mouth daily as needed  for anxiety.     [provider]  ?ALPRAZolam Duanne Moron) 1 MG tablet Take 1 tablet (1 mg total) by mouth 3 (three) times daily as needed. 08/04/20     ?ALPRAZolam (XANAX) 1 MG tablet Take 1 tablet by mouth 3 times daily 11/13/20     ?ALPRAZolam (XANAX) 1 MG tablet Take 1 tablet by mouth 3 times a day 02/19/21     ?amLODipine (NORVASC) 10 MG tablet Take 10 mg by mouth every evening.    [provider]  ?benzonatate (TESSALON) 100 MG capsule Take 1 capsule (100 mg total) by mouth 3 (three) times daily as needed. 04/06/21   [name], PA-C  ?cetirizine (ZYRTEC) 10 MG tablet Take 10 mg by mouth daily as needed for allergies. 09/23/19   [provider]  ?cetirizine (ZYRTEC) 10 MG tablet TAKE (1) TABLET BY MOUTH ONCE DAILY. 08/04/20     ?DENTA 5000 PLUS 1.1 % CREA dental cream Take 1 application by mouth 2 (two) times daily. 11/01/19   [provider]  ?diltiazem (CARDIZEM CD) 360 MG 24 hr capsule Take 1 capsule (360 mg total) by mouth every evening. 08/04/20     ?fenofibrate 160 MG tablet Take 160 mg by mouth daily.      [provider]  ?fenofibrate 160 MG tablet TAKE ONE TABLET BY MOUTH ONCE DAILY. 08/04/20     ?fenofibrate 160 MG tablet Take 1 tablet by mouth daily 11/13/20     ?ibuprofen (ADVIL) 200 MG tablet Take  200 mg by mouth every 6 (six) hours as needed for headache or moderate pain.    [provider]  ?metFORMIN (GLUCOPHAGE) 500 MG tablet Take 500-1,000 mg by mouth See admin instructions. Take 500 mg in the morning and 1000 mg in the evening    [provider]  ?metFORMIN (GLUCOPHAGE) 500 MG tablet TAKE 2 TABLETS BY MOUTH TWICE DAILY. 12/18/20     ?olmesartan (BENICAR) 40 MG tablet Take 40 mg by mouth daily. 11/05/19   [provider]  ?olmesartan (BENICAR) 40 MG tablet TAKE 1 TABLET BY MOUTH ONCE DAILY. 02/13/21     ?olmesartan-hydrochlorothiazide (BENICAR HCT) 40-25 MG tablet Take 1 tablet by mouth once a day 02/19/21     ?PRESCRIPTION  MEDICATION Apply 1 application topically daily as needed (pain). Compounded pain cream diclofenac 3%, baclofen 2%, gabapentin 5%, lidocaine 5%, menthol  1%    [provider]  ?promethazine-dextromethorphan (PROMETHAZINE-DM) 6.25-15 MG/5ML syrup Take 5 mLs by mouth 4 (four) times daily as needed. 04/06/21   [name], PA-C  ?Propylene Glycol (SYSTANE COMPLETE) 0.6 % SOLN Place 1 drop into both eyes 2 (two) times daily.    [provider]  ?simvastatin (ZOCOR) 20 MG tablet Take 20 mg by mouth every evening.  04/02/19   [provider]  ?simvastatin (ZOCOR) 20 MG tablet TAKE 1 TABLET BY MOUTH ONCE DAILY. 01/22/21     ?sodium fluoride (PREVIDENT 5000 DRY MOUTH) 1.1 % GEL dental gel use to brush teeth once daily at night 10/12/20     ?Vitamin D, Ergocalciferol, (DRISDOL) 1.25 MG (50000 UNIT) CAPS capsule Take 1 capsule by mouth once a week for 12 weeks then take 2000 units otc daily 02/20/21     ?   ? ?Allergies    ?Patient has no known allergies.   ? ?Review of Systems   ?Review of Systems  ?Gastrointestinal:  Positive for abdominal pain, constipation and vomiting.  ? ?Physical Exam ?Updated Vital Signs ?BP (!) 187/94   Pulse 82   Temp 97.8 ?F (36.6 ?C) (Oral)   Resp 18   Ht 5' 2"  (1.575 m)   Wt 62.1 kg   SpO2 93%   BMI 25.06 kg/m?  ?Physical Exam ?Vitals and nursing note reviewed.  ?Constitutional:   ?   General: He is not in acute distress. ?   Appearance: He is well-developed. He is ill-appearing and diaphoretic.  ?HENT:  ?   Head: Normocephalic and atraumatic.  ?Eyes:  ?   General: No scleral icterus. ?   Conjunctiva/sclera: Conjunctivae normal.  ?Cardiovascular:  ?   Rate and Rhythm: Regular rhythm. Tachycardia present.  ?   Pulses:     ?     Radial pulses are 2+ on the right side and 2+ on the left side.  ?     Dorsalis pedis pulses are 2+ on the right side and 2+ on the left side.  ?   Heart sounds: Murmur heard.  ?Pulmonary:  ?   Effort: Pulmonary effort is normal. No  respiratory distress.  ?   Breath sounds: Rales present.  ?Chest:  ?   Chest wall: No tenderness.  ?Abdominal:  ?   General: Abdomen is protuberant. Bowel sounds are decreased. There is no distension.  ?   Palpations: Abdomen is soft.  ?   Tenderness: There is generalized abdominal tenderness. There is no right CVA tenderness or left CVA tenderness.  ?Musculoskeletal:     ?   General: No swelling.  ?  Cervical back: Neck supple.  ?   Right lower leg: No edema.  ?   Left lower leg: No edema.  ?Skin: ?   General: Skin is warm.  ?   Capillary Refill: Capillary refill takes less than 2 seconds.  ?   Coloration: Skin is not cyanotic or jaundiced.  ?Neurological:  ?   General: No focal deficit present.  ?   Mental Status: He is alert and oriented to person, place, and time.  ?Psychiatric:     ?   Mood and Affect: Mood normal.  ? ? ?ED Results / Procedures / Treatments   ?Labs ?(all labs ordered are listed, but only abnormal results are displayed) ?Labs Reviewed  ?CBC WITH DIFFERENTIAL/PLATELET - Abnormal; Notable for the following components:  ?    Result Value  ? WBC 20.1 (*)   ? Platelets 455 (*)   ? Neutro Abs 17.7 (*)   ? Monocytes Absolute 1.5 (*)   ? Abs Immature Granulocytes 0.10 (*)   ? All other components within normal limits  ?COMPREHENSIVE METABOLIC PANEL - Abnormal; Notable for the following components:  ? Sodium 134 (*)   ? Chloride 94 (*)   ? Glucose, Bld 243 (*)   ? BUN 35 (*)   ? Creatinine, Ser 1.73 (*)   ? Calcium 12.3 (*)   ? AST 47 (*)   ? ALT 45 (*)   ? Alkaline Phosphatase 33 (*)   ? GFR, Estimated 44 (*)   ? All other components within normal limits  ?LIPASE, BLOOD - Abnormal; Notable for the following components:  ? Lipase 1,221 (*)   ? All other components within normal limits  ?URINALYSIS, ROUTINE W REFLEX MICROSCOPIC - Abnormal; Notable for the following components:  ? Glucose, UA >=500 (*)   ? All other components within normal limits  ?LACTIC ACID, PLASMA - Abnormal; Notable for the  following components:  ? Lactic Acid, Venous 2.5 (*)   ? All other components within normal limits  ?LACTIC ACID, PLASMA - Abnormal; Notable for the following components:  ? Lactic Acid, Venous 3.5 (*)   ? All other components

## 2021-04-21 NOTE — ED Notes (Signed)
Critical value ?Lactic acid 2.5 reported to EDP [place] at this time.  ?

## 2021-04-22 ENCOUNTER — Encounter (HOSPITAL_COMMUNITY): Payer: Self-pay | Admitting: Family Medicine

## 2021-04-22 ENCOUNTER — Other Ambulatory Visit: Payer: Self-pay

## 2021-04-22 ENCOUNTER — Inpatient Hospital Stay (HOSPITAL_COMMUNITY): Payer: 59

## 2021-04-22 DIAGNOSIS — R112 Nausea with vomiting, unspecified: Secondary | ICD-10-CM

## 2021-04-22 LAB — CBC
HCT: 44.9 % (ref 39.0–52.0)
Hemoglobin: 14.7 g/dL (ref 13.0–17.0)
MCH: 31.7 pg (ref 26.0–34.0)
MCHC: 32.7 g/dL (ref 30.0–36.0)
MCV: 96.8 fL (ref 80.0–100.0)
Platelets: 358 10*3/uL (ref 150–400)
RBC: 4.64 MIL/uL (ref 4.22–5.81)
RDW: 14.4 % (ref 11.5–15.5)
WBC: 14.3 10*3/uL — ABNORMAL HIGH (ref 4.0–10.5)
nRBC: 0 % (ref 0.0–0.2)

## 2021-04-22 LAB — COMPREHENSIVE METABOLIC PANEL
ALT: 30 U/L (ref 0–44)
AST: 37 U/L (ref 15–41)
Albumin: 3.1 g/dL — ABNORMAL LOW (ref 3.5–5.0)
Alkaline Phosphatase: 22 U/L — ABNORMAL LOW (ref 38–126)
Anion gap: 9 (ref 5–15)
BUN: 34 mg/dL — ABNORMAL HIGH (ref 8–23)
CO2: 24 mmol/L (ref 22–32)
Calcium: 9.6 mg/dL (ref 8.9–10.3)
Chloride: 107 mmol/L (ref 98–111)
Creatinine, Ser: 1.37 mg/dL — ABNORMAL HIGH (ref 0.61–1.24)
GFR, Estimated: 58 mL/min — ABNORMAL LOW (ref >60)
Glucose, Bld: 143 mg/dL — ABNORMAL HIGH (ref 70–99)
Potassium: 4.4 mmol/L (ref 3.5–5.1)
Sodium: 140 mmol/L (ref 135–145)
Total Bilirubin: 0.5 mg/dL (ref 0.3–1.2)
Total Protein: 5.7 g/dL — ABNORMAL LOW (ref 6.5–8.1)

## 2021-04-22 LAB — CBG MONITORING, ED
Glucose-Capillary: 127 mg/dL — ABNORMAL HIGH (ref 70–99)
Glucose-Capillary: 158 mg/dL — ABNORMAL HIGH (ref 70–99)

## 2021-04-22 LAB — MAGNESIUM: Magnesium: 1.7 mg/dL (ref 1.7–2.4)

## 2021-04-22 LAB — GLUCOSE, CAPILLARY
Glucose-Capillary: 111 mg/dL — ABNORMAL HIGH (ref 70–99)
Glucose-Capillary: 104 mg/dL — ABNORMAL HIGH (ref 70–99)

## 2021-04-22 LAB — HEMOGLOBIN A1C
Hgb A1c MFr Bld: 6.6 % — ABNORMAL HIGH (ref 4.8–5.6)
Mean Plasma Glucose: 142.72 mg/dL

## 2021-04-22 LAB — HIV ANTIBODY (ROUTINE TESTING W REFLEX): HIV Screen 4th Generation wRfx: NONREACTIVE

## 2021-04-22 MED ORDER — POLYETHYLENE GLYCOL 3350 17 G PO PACK
17.0000 g | PACK | Freq: Every day | ORAL | Status: DC
  Administered 2021-04-23 – 2021-04-25 (×3): 17 g via ORAL
  Filled 2021-04-22 (×3): qty 1

## 2021-04-22 MED ORDER — GUAIFENESIN ER 600 MG PO TB12
600.0000 mg | ORAL_TABLET | Freq: Two times a day (BID) | ORAL | Status: DC
  Administered 2021-04-22 – 2021-04-25 (×7): 600 mg via ORAL
  Filled 2021-04-22 (×7): qty 1

## 2021-04-22 MED ORDER — IPRATROPIUM-ALBUTEROL 0.5-2.5 (3) MG/3ML IN SOLN: 3.0000 mL | Freq: Four times a day (QID) | RESPIRATORY_TRACT | Status: DC

## 2021-04-22 NOTE — Progress Notes (Signed)
PROGRESS NOTE    Kyle Giles  ZHY:[id number] DOB: 06/21/1960 DOA: 04/21/2021 PCP: Assunta Found, MD    Brief Narrative:  61 year old male admitted to the hospital with abdominal pain.  Found to have acute alcoholic pancreatitis, acute kidney injury.  Started on IV hydration, bowel rest and pain management.  He is on CIWA protocol and is being watched for any developing alcohol withdrawal   Assessment & Plan:   Principal Problem:   Pancreatitis, acute Alcoholic Active Problems:   Heavy Alcohol abuse   Alcoholic Hepatic steatosis   Tobacco abuse   Acute alcoholic pancreatitis -He is being resuscitated with IV fluids -Abdominal pain is improving, will start on clear liquids -Continue pain management  Alcohol abuse -Currently on CIWA protocol -No signs of active withdrawal at this time  Acute kidney injury -Likely related to volume depletion -Creatinine 1.7 on admission -Holding olmesartan/hydrochlorothiazide -Creatinine improving with IV fluids  Hypertension -Blood pressure stable -Continued on amlodipine, Coreg is also been added  Duodenitis -Continue on PPI  Diabetes -Holding metformin -A1c in process -Continue to monitor and SSI  Elevated LFTs -Likely reactive -Improved with IV fluids  Recent positive COVID test -Chest x-ray on admission unremarkable -Since he does have a productive cough, will repeat x-ray -He is not febrile, he is out of the window for isolation   DVT prophylaxis: heparin injection 5,000 Units Start: 04/21/21 2230 SCDs Start: 04/21/21 2222 Place TED hose Start: 04/21/21 2222  Code Status: Full code Family Communication: Discussed with patient Disposition Plan: Status is: Inpatient Remains inpatient appropriate because: Continued management of pancreatitis     Consultants:    Procedures:    Antimicrobials:      Subjective: Reports that overall abdominal pain is better today.  Has not had any vomiting.  He does have  a productive cough.  Objective: Vitals:   04/22/21 0830 04/22/21 0845 04/22/21 0900 04/22/21 0930  BP: (!) 152/82  129/83 128/78  Pulse: (!) 102 (!) 102 (!) 103 90  Resp: (!) 25 20 (!) 25 (!) 24  Temp:      TempSrc:      SpO2: 91% 92% 91% 91%  Weight:      Height:       No intake or output data in the 24 hours ending 04/22/21 1154 Filed Weights   04/21/21 1752  Weight: 62.1 kg    Examination:  General exam: Appears calm and comfortable  Respiratory system: bilateral rhonchi. Respiratory effort normal. Cardiovascular system: S1 & S2 heard, RRR. No JVD, murmurs, rubs, gallops or clicks. No pedal edema. Gastrointestinal system: Abdomen is nondistended, soft and mild tenderness in periumbilical area r. No organomegaly or masses felt. Normal bowel sounds heard. Central nervous system: Alert and oriented. No focal neurological deficits. Extremities: Symmetric 5 x 5 power. Skin: No rashes, lesions or ulcers Psychiatry: Judgement and insight appear normal. Mood & affect appropriate.     Data Reviewed: I have personally reviewed following labs and imaging studies  CBC: Recent Labs  Lab 04/21/21 1836 04/22/21 0556  WBC 20.1* 14.3*  NEUTROABS 17.7*  --   HGB 15.0 14.7  HCT 46.3 44.9  MCV 93.9 96.8  PLT 455* 358   Basic Metabolic Panel: Recent Labs  Lab 04/21/21 1836 04/22/21 0556  NA 134* 140  K 4.2 4.4  CL 94* 107  CO2 25 24  GLUCOSE 243* 143*  BUN 35* 34*  CREATININE 1.73* 1.37*  CALCIUM 12.3* 9.6  MG  --  1.7   GFR:  Estimated Creatinine Clearance: 43.2 mL/min (A) (by C-G formula based on SCr of 1.37 mg/dL (H)). Liver Function Tests: Recent Labs  Lab 04/21/21 1836 04/22/21 0556  AST 47* 37  ALT 45* 30  ALKPHOS 33* 22*  BILITOT 0.8 0.5  PROT 8.0 5.7*  ALBUMIN 4.6 3.1*   Recent Labs  Lab 04/21/21 1836  LIPASE 1,221*   No results for input(s): AMMONIA in the last 168 hours. Coagulation Profile: Recent Labs  Lab 04/21/21 1948  INR 1.0    Cardiac Enzymes: No results for input(s): CKTOTAL, CKMB, CKMBINDEX, TROPONINI in the last 168 hours. BNP (last 3 results) No results for input(s): PROBNP in the last 8760 hours. HbA1C: No results for input(s): HGBA1C in the last 72 hours. CBG: Recent Labs  Lab 04/21/21 2342 04/22/21 0849  GLUCAP 213* 127*   Lipid Profile: No results for input(s): CHOL, HDL, LDLCALC, TRIG, CHOLHDL, LDLDIRECT in the last 72 hours. Thyroid Function Tests: No results for input(s): TSH, T4TOTAL, FREET4, T3FREE, THYROIDAB in the last 72 hours. Anemia Panel: No results for input(s): VITAMINB12, FOLATE, FERRITIN, TIBC, IRON, RETICCTPCT in the last 72 hours. Sepsis Labs: Recent Labs  Lab 04/21/21 1948 04/21/21 2124  LATICACIDVEN 2.5* 3.5*    Recent Results (from the past 240 hour(s))  Blood Culture (routine x 2)     Status: None (Preliminary result)   Collection Time: 04/21/21  7:49 PM   Specimen: Right Antecubital; Blood  Result Value Ref Range Status   Specimen Description   Final    RIGHT ANTECUBITAL BOTTLES DRAWN AEROBIC AND ANAEROBIC   Special Requests   Final    Blood Culture adequate volume Performed at [facility], [address], [place], [place] [address]    Culture PENDING  Incomplete   Report Status PENDING  Incomplete  Blood Culture (routine x 2)     Status: None (Preliminary result)   Collection Time: 04/21/21  7:55 PM   Specimen: BLOOD LEFT HAND  Result Value Ref Range Status   Specimen Description BLOOD LEFT HAND BOTTLES DRAWN AEROBIC ONLY  Final   Special Requests   Final    Blood Culture adequate volume Performed at [facility], [address], [place], [place] [address]    Culture PENDING  Incomplete   Report Status PENDING  Incomplete         Radiology Studies: CT Abdomen Pelvis W Contrast  Result Date: 04/21/2021 CLINICAL DATA:  Bowel obstruction. Abdominal pain, acute, nonlocalized. EXAM: CT ABDOMEN AND PELVIS WITH CONTRAST TECHNIQUE: Multidetector CT  imaging of the abdomen and pelvis was performed using the standard protocol following bolus administration of intravenous contrast. RADIATION DOSE REDUCTION: This exam was performed according to the departmental dose-optimization program which includes automated exposure control, adjustment of the mA and/or kV according to patient size and/or use of iterative reconstruction technique. CONTRAST:  75mL OMNIPAQUE IOHEXOL 300 MG/ML  SOLN COMPARISON:  None. FINDINGS: Lower chest: No acute abnormality. Hepatobiliary: Moderate hepatic steatosis. No enhancing intrahepatic mass. No intra or extrahepatic biliary ductal dilation. Gallbladder unremarkable. Pancreas: There is extensive peripancreatic inflammatory fluid which tracks into the anterior pararenal spaces bilaterally as well as into the mesentery and omentum. The pancreatic parenchyma itself appears thickened and edematous with relative hypoenhancement of the body and tail the pancreas. No frankly nonenhancing segments of pancreatic parenchyma are identified, however, to suggest frank pancreatic necrosis. Calcifications within the head of the pancreas are in keeping with superimposed changes of chronic pancreatitis. The pancreatic duct is not dilated. No loculated  peripancreatic fluid collections or necrotic collections are identified. There is relative hypoenhancement of the adjacent duodenal wall involving the second portion of the duodenum which may relate to asymmetric edema and secondary duodenitis in this location. Spleen: Unremarkable Adrenals/Urinary Tract: The adrenal glands are unremarkable. The kidneys are normal in size and position. Cortical cysts are seen bilaterally. No enhancing intrarenal masses. No intrarenal or ureteral calculi. No hydronephrosis. The bladder is mildly distended but is otherwise unremarkable. Stomach/Bowel: Mild descending and sigmoid colonic diverticulosis. Moderate ascending diverticulosis. The stomach, small bowel, and large  bowel are otherwise unremarkable. Appendix normal. No free intraperitoneal gas or fluid. Vascular/Lymphatic: Moderate aortoiliac atherosclerotic calcification. No aortic aneurysm. Superior mesenteric vein, splenic vein, and portal vein are patent. No pathologic adenopathy within the abdomen and pelvis. Reproductive: Prostate is unremarkable. Other: No abdominal wall hernia. Musculoskeletal: No lytic or blastic bone lesion. No acute bone abnormality. IMPRESSION: Acute interstitial/edematous pancreatitis superimposed on chronic pancreatitis with extensive acute peripancreatic inflammatory fluid. Relative hypoenhancement of the body and tail the pancreas without frank pancreatic necrosis identified. Heterogeneous enhancement of the second portion of the duodenum likely related to edema and secondary duodenitis related to the adjacent inflammatory process. No evidence of obstruction or perforation. Moderate hepatic steatosis. Mild to moderate diverticulosis without superimposed acute inflammatory change. Aortic Atherosclerosis (ICD10-I70.0). Electronically Signed   By: Helyn Numbers M.D.   On: 04/21/2021 20:35   DG Chest Port 1 View  Result Date: 04/21/2021 CLINICAL DATA:  Abnormal lung sounds.  Pain. EXAM: PORTABLE CHEST 1 VIEW COMPARISON:  04/17/2021 and prior studies FINDINGS: The cardiomediastinal silhouette is unremarkable. There is no evidence of focal airspace disease, pulmonary edema, suspicious pulmonary nodule/mass, pleural effusion, or pneumothorax. No acute bony abnormalities are identified. IMPRESSION: No active disease. Electronically Signed   By: Harmon Pier M.D.   On: 04/21/2021 19:06        Scheduled Meds:  amLODipine  10 mg Oral Daily   carvedilol  6.25 mg Oral BID WC   diazepam  2 mg Oral TID   folic acid  1 mg Oral Daily   guaiFENesin  600 mg Oral BID   heparin  5,000 Units Subcutaneous Q8H   insulin aspart  0-5 Units Subcutaneous QHS   insulin aspart  0-6 Units Subcutaneous TID WC    ipratropium-albuterol  3 mL Nebulization Q6H   multivitamin with minerals  1 tablet Oral Daily   nicotine  21 mg Transdermal Daily   polyethylene glycol  17 g Oral Daily   sodium chloride flush  3 mL Intravenous Q12H   sodium chloride flush  3 mL Intravenous Q12H   thiamine  100 mg Oral Daily   Or   thiamine  100 mg Intravenous Daily   Continuous Infusions:  sodium chloride 175 mL/hr at 04/22/21 0338   sodium chloride       LOS: 1 day    Time spent:    Erick Blinks, MD [facility]   If 7PM-7AM, please contact night-coverage [email or web address]  04/22/2021, 11:54 AM

## 2021-04-22 NOTE — Plan of Care (Signed)
  Problem: Activity: Goal: Risk for activity intolerance will decrease Outcome: Progressing   Problem: Nutrition: Goal: Adequate nutrition will be maintained Outcome: Progressing   Problem: Coping: Goal: Level of anxiety will decrease Outcome: Progressing   Problem: Pain Managment: Goal: General experience of comfort will improve Outcome: Progressing   Problem: Safety: Goal: Ability to remain free from injury will improve Outcome: Progressing   

## 2021-04-22 NOTE — ED Notes (Signed)
Pt ambulated to bathroom with walker.

## 2021-04-22 NOTE — ED Notes (Signed)
Patient ambulated approximately 40 feet with walker independently.  ?

## 2021-04-23 ENCOUNTER — Inpatient Hospital Stay (HOSPITAL_COMMUNITY): Payer: 59

## 2021-04-23 LAB — CBC
HCT: 36.9 % — ABNORMAL LOW (ref 39.0–52.0)
Hemoglobin: 11.5 g/dL — ABNORMAL LOW (ref 13.0–17.0)
MCH: 31.3 pg (ref 26.0–34.0)
MCHC: 31.2 g/dL (ref 30.0–36.0)
MCV: 100.3 fL — ABNORMAL HIGH (ref 80.0–100.0)
Platelets: 246 10*3/uL (ref 150–400)
RBC: 3.68 MIL/uL — ABNORMAL LOW (ref 4.22–5.81)
RDW: 14.8 % (ref 11.5–15.5)
WBC: 10.3 10*3/uL (ref 4.0–10.5)
nRBC: 0 % (ref 0.0–0.2)

## 2021-04-23 LAB — GLUCOSE, CAPILLARY
Glucose-Capillary: 80 mg/dL (ref 70–99)
Glucose-Capillary: 91 mg/dL (ref 70–99)
Glucose-Capillary: 100 mg/dL — ABNORMAL HIGH (ref 70–99)
Glucose-Capillary: 104 mg/dL — ABNORMAL HIGH (ref 70–99)

## 2021-04-23 LAB — LACTIC ACID, PLASMA: Lactic Acid, Venous: 1.1 mmol/L (ref 0.5–1.9)

## 2021-04-23 LAB — HEPATITIS PANEL, ACUTE
HCV Ab: NONREACTIVE
Hep A IgM: NONREACTIVE
Hep B C IgM: NONREACTIVE
Hepatitis B Surface Ag: NONREACTIVE

## 2021-04-23 LAB — BASIC METABOLIC PANEL
Anion gap: 9 (ref 5–15)
BUN: 50 mg/dL — ABNORMAL HIGH (ref 8–23)
CO2: 22 mmol/L (ref 22–32)
Calcium: 9.2 mg/dL (ref 8.9–10.3)
Chloride: 108 mmol/L (ref 98–111)
Creatinine, Ser: 1.72 mg/dL — ABNORMAL HIGH (ref 0.61–1.24)
GFR, Estimated: 44 mL/min — ABNORMAL LOW (ref >60)
Glucose, Bld: 100 mg/dL — ABNORMAL HIGH (ref 70–99)
Potassium: 4.5 mmol/L (ref 3.5–5.1)
Sodium: 139 mmol/L (ref 135–145)

## 2021-04-23 LAB — URINE CULTURE: Culture: NO GROWTH

## 2021-04-23 MED ORDER — IPRATROPIUM-ALBUTEROL 0.5-2.5 (3) MG/3ML IN SOLN
3.0000 mL | Freq: Four times a day (QID) | RESPIRATORY_TRACT | Status: DC
Start: 2021-04-23 — End: 2021-04-24
  Administered 2021-04-23 – 2021-04-24 (×3): 3 mL via RESPIRATORY_TRACT
  Filled 2021-04-23 (×4): qty 3

## 2021-04-23 MED ORDER — BISACODYL 10 MG RE SUPP
10.0000 mg | Freq: Once | RECTAL | Status: AC
  Administered 2021-04-23: 10 mg via RECTAL
  Filled 2021-04-23: qty 1

## 2021-04-23 NOTE — Progress Notes (Signed)
Lab called with correction from 04/21/21 lipase correction 1501. ? ?

## 2021-04-23 NOTE — Progress Notes (Signed)
?  Transition of Care (TOC) Screening Note ? ? ?Patient Details  ?Name: Kyle Giles ?Date of Birth: 1960-05-21 ? ? ?Transition of Care (TOC) CM/SW Contact:    ?Iona Beard, LCSWA ?Phone Number: ?04/23/2021, 10:52 AM ? ?TOC consulted for substance use resources. TOC completed chart review. Per H&P documentation pt was not interested in substance use resources. TOC can be consulted if pt were to change his mind.  ? ?Transition of Care Department [facility]) has reviewed patient and no TOC needs have been identified at this time. We will continue to monitor patient advancement through interdisciplinary progression rounds. If new patient transition needs arise, please place a TOC consult. ?  ?

## 2021-04-23 NOTE — Progress Notes (Signed)
?PROGRESS NOTE ? ? ? ?Kyle Giles  YIR:[id number] DOB: 1960/08/27 DOA: 04/21/2021 ?PCP: Sharilyn Sites, MD  ? ? ?Brief Narrative:  ?61 year old male admitted to the hospital with abdominal pain.  Found to have acute alcoholic pancreatitis, acute kidney injury.  Started on IV hydration, bowel rest and pain management.  He is on CIWA protocol and is being watched for any developing alcohol withdrawal ? ? ?Assessment & Plan: ?  ?Principal Problem: ?  Pancreatitis, acute Alcoholic ?Active Problems: ?  Heavy Alcohol abuse ?  Alcoholic Hepatic steatosis ?  Tobacco abuse ? ? ?Acute alcoholic pancreatitis ?-He is being resuscitated with IV fluids ?-Abdominal pain is improving, and he is tolerating clear liquids ?-Continue pain management ?-We will advance to low-fat diet ? ?Alcohol abuse ?-Last drink was 3/17 ?-Currently on CIWA protocol ?-His wife feels that he may be more confused and diaphoretic ?-Continue Ativan per CIWA ? ?Acute kidney injury ?-Likely related to volume depletion ?-Creatinine 1.7 on admission ?-Holding olmesartan/hydrochlorothiazide ?-Creatinine initially improved to 1.3, now back up to 1.7 ?-Renal ultrasound ?-Continue IV fluids ? ?Hypertension ?-Blood pressure stable ?-Holding amlodipine ?-Continue Coreg ? ?Duodenitis ?-Continue on PPI ? ?Diabetes ?-Holding metformin ?-A1c 6.6 ?-Blood sugars have been stable ?-Continue to monitor and SSI ? ?Elevated LFTs ?-Likely reactive ?-Viral hepatitis panel negative ?-Improved with IV fluids ? ?Recent positive COVID test ?-Chest x-ray on admission unremarkable ?-Since he does have a productive cough, will continue pulmonary hygiene ?-He is not febrile, he is out of the window for isolation ? ? ?DVT prophylaxis: heparin injection 5,000 Units Start: 04/21/21 2230 ?SCDs Start: 04/21/21 2222 ?Place TED hose Start: 04/21/21 2222 ? ?Code Status: Full code ?Family Communication: Discussed with patient ?Disposition Plan: Status is: Inpatient ?Remains inpatient  appropriate because: Continued management of pancreatitis ? ? ? ? ?Consultants:  ? ? ?Procedures:  ? ? ?Antimicrobials:  ?  ? ? ?Subjective: ?Says overall pain is better.  He is tolerating clear liquids.  He has not had a bowel movement in several days.  Continues to have productive cough ? ?Objective: ?Vitals:  ? 04/23/21 0611 04/23/21 0813 04/23/21 1405 04/23/21 1439  ?BP: 120/69 136/71 105/60   ?Pulse: 93 96 86   ?Resp:      ?Temp: 97.9 ?F (36.6 ?C)  98.6 ?F (37 ?C)   ?TempSrc: Oral  Oral   ?SpO2: 94%  97% 96%  ?Weight:      ?Height:      ? ?No intake or output data in the 24 hours ending 04/23/21 1837 ?Filed Weights  ? 04/21/21 1752  ?Weight: 62.1 kg  ? ? ?Examination: ? ?General exam: Alert, awake, oriented x 3 ?Respiratory system: Diminished breath sounds at bases. Respiratory effort normal. ?Cardiovascular system:RRR. No murmurs, rubs, gallops. ?Gastrointestinal system: Abdomen is nondistended, soft and mildly tender in periumbilical area.  No organomegaly or masses felt. Normal bowel sounds heard. ?Central nervous system: Alert and oriented. No focal neurological deficits. ?Extremities: No C/C/E, +pedal pulses ?Skin: No rashes, lesions or ulcers ?Psychiatry: Judgement and insight appear normal. Mood & affect appropriate.  ? ? ? ?Data Reviewed: I have personally reviewed following labs and imaging studies ? ?CBC: ?Recent Labs  ?Lab 04/21/21 ?1836 04/22/21 ?5009 04/23/21 ?0414  ?WBC 20.1* 14.3* 10.3  ?NEUTROABS 17.7*  --   --   ?HGB 15.0 14.7 11.5*  ?HCT 46.3 44.9 36.9*  ?MCV 93.9 96.8 100.3*  ?PLT 455* 358 246  ? ?Basic Metabolic Panel: ?Recent Labs  ?Lab 04/21/21 ?1836 04/22/21 ?3818 04/23/21 ?  0414  ?NA 134* 140 139  ?K 4.2 4.4 4.5  ?CL 94* 107 108  ?CO2 25 24 22   ?GLUCOSE 243* 143* 100*  ?BUN 35* 34* 50*  ?CREATININE 1.73* 1.37* 1.72*  ?CALCIUM 12.3* 9.6 9.2  ?MG  --  1.7  --   ? ?GFR: ?Estimated Creatinine Clearance: 34.4 mL/min (A) (by C-G formula based on SCr of 1.72 mg/dL (H)). ?Liver Function  Tests: ?Recent Labs  ?Lab 04/21/21 ?1836 04/22/21 ?8099  ?AST 47* 37  ?ALT 45* 30  ?ALKPHOS 33* 22*  ?BILITOT 0.8 0.5  ?PROT 8.0 5.7*  ?ALBUMIN 4.6 3.1*  ? ?Recent Labs  ?Lab 04/21/21 ?1836  ?LIPASE 1,221*  ? ?No results for input(s): AMMONIA in the last 168 hours. ?Coagulation Profile: ?Recent Labs  ?Lab 04/21/21 ?1948  ?INR 1.0  ? ?Cardiac Enzymes: ?No results for input(s): CKTOTAL, CKMB, CKMBINDEX, TROPONINI in the last 168 hours. ?BNP (last 3 results) ?No results for input(s): PROBNP in the last 8760 hours. ?HbA1C: ?Recent Labs  ?  04/21/21 ?1948  ?HGBA1C 6.6*  ? ?CBG: ?Recent Labs  ?Lab 04/22/21 ?1817 04/22/21 ?2234 04/23/21 ?0744 04/23/21 ?1057 04/23/21 ?1621  ?GLUCAP 111* 104* 80 91 100*  ? ?Lipid Profile: ?No results for input(s): CHOL, HDL, LDLCALC, TRIG, CHOLHDL, LDLDIRECT in the last 72 hours. ?Thyroid Function Tests: ?No results for input(s): TSH, T4TOTAL, FREET4, T3FREE, THYROIDAB in the last 72 hours. ?Anemia Panel: ?No results for input(s): VITAMINB12, FOLATE, FERRITIN, TIBC, IRON, RETICCTPCT in the last 72 hours. ?Sepsis Labs: ?Recent Labs  ?Lab 04/21/21 ?1948 04/21/21 ?2124 04/23/21 ?0414  ?LATICACIDVEN 2.5* 3.5* 1.1  ? ? ?Recent Results (from the past 240 hour(s))  ?Blood Culture (routine x 2)     Status: None (Preliminary result)  ? Collection Time: 04/21/21  7:49 PM  ? Specimen: Right Antecubital; Blood  ?Result Value Ref Range Status  ? Specimen Description   Final  ?  RIGHT ANTECUBITAL BOTTLES DRAWN AEROBIC AND ANAEROBIC  ? Special Requests Blood Culture adequate volume  Final  ? Culture   Final  ?  NO GROWTH < 24 HOURS ?Performed at [facility], [address], [place], [place] [address] ?  ? Report Status PENDING  Incomplete  ?Blood Culture (routine x 2)     Status: None (Preliminary result)  ? Collection Time: 04/21/21  7:55 PM  ? Specimen: BLOOD LEFT HAND  ?Result Value Ref Range Status  ? Specimen Description BLOOD LEFT HAND BOTTLES DRAWN AEROBIC ONLY  Final  ? Special Requests Blood  Culture adequate volume  Final  ? Culture   Final  ?  NO GROWTH < 24 HOURS ?Performed at [facility], [address], [place], [place] [address] ?  ? Report Status PENDING  Incomplete  ?Urine Culture     Status: None  ? Collection Time: 04/21/21 10:44 PM  ? Specimen: Urine, Clean Catch  ?Result Value Ref Range Status  ? Specimen Description   Final  ?  URINE, CLEAN CATCH ?Performed at [facility], [address] [place], [place] [address] ?  ? Special Requests   Final  ?  NONE ?Performed at [facility], [address], [place], [place] [address] ?  ? Culture   Final  ?  NO GROWTH ?Performed at [facility], [place] [address] [place],  [address] ?  ? Report Status 04/23/2021 FINAL  Final  ?  ? ? ? ? ? ?Radiology Studies: ?DG Chest 1 View ? ?Result Date: 04/23/2021 ?CLINICAL DATA:  Abdominal pain, pancreatitis EXAM: CHEST  1 VIEW COMPARISON:  Chest radiograph 04/22/2021 FINDINGS: The cardiomediastinal silhouette is stable. Lung volumes are mildly low. There are new patchy opacities in the medial right base and reticular opacities in the left base. There is no other focal consolidation. There is no pulmonary edema. There is no pleural effusion or pneumothorax. There is no acute osseous abnormality. IMPRESSION: New opacities in the medial right base and reticular opacities in the left base may reflect atelectasis, though developing infection or aspiration can not be excluded. Electronically Signed   By: Valetta Mole M.D.   On: 04/23/2021 15:23  ? ?CT Abdomen Pelvis W Contrast ? ?Result Date: 04/21/2021 ?CLINICAL DATA:  Bowel obstruction. Abdominal pain, acute, nonlocalized. EXAM: CT ABDOMEN AND PELVIS WITH CONTRAST TECHNIQUE: Multidetector CT imaging of the abdomen and pelvis was performed using the standard protocol following bolus administration of intravenous contrast. RADIATION DOSE REDUCTION: This exam was performed according to the departmental dose-optimization program which includes automated exposure  control, adjustment of the mA and/or kV according to patient size and/or use of iterative reconstruction technique. CONTRAST:  2m OMNIPAQUE IOHEXOL 300 MG/ML  SOLN COMPARISON:  None. FINDINGS: Lower chest: No acute abnorm

## 2021-04-24 LAB — CBC
HCT: 30 % — ABNORMAL LOW (ref 39.0–52.0)
Hemoglobin: 9.3 g/dL — ABNORMAL LOW (ref 13.0–17.0)
MCH: 30.5 pg (ref 26.0–34.0)
MCHC: 31 g/dL (ref 30.0–36.0)
MCV: 98.4 fL (ref 80.0–100.0)
Platelets: 203 10*3/uL (ref 150–400)
RBC: 3.05 MIL/uL — ABNORMAL LOW (ref 4.22–5.81)
RDW: 14.7 % (ref 11.5–15.5)
WBC: 5.5 10*3/uL (ref 4.0–10.5)
nRBC: 0 % (ref 0.0–0.2)

## 2021-04-24 LAB — COMPREHENSIVE METABOLIC PANEL
ALT: 25 U/L (ref 0–44)
AST: 24 U/L (ref 15–41)
Albumin: 2.5 g/dL — ABNORMAL LOW (ref 3.5–5.0)
Alkaline Phosphatase: 24 U/L — ABNORMAL LOW (ref 38–126)
Anion gap: 7 (ref 5–15)
BUN: 37 mg/dL — ABNORMAL HIGH (ref 8–23)
CO2: 24 mmol/L (ref 22–32)
Calcium: 8.5 mg/dL — ABNORMAL LOW (ref 8.9–10.3)
Chloride: 110 mmol/L (ref 98–111)
Creatinine, Ser: 1.05 mg/dL (ref 0.61–1.24)
GFR, Estimated: >60 mL/min (ref >60)
Glucose, Bld: 83 mg/dL (ref 70–99)
Potassium: 3.9 mmol/L (ref 3.5–5.1)
Sodium: 141 mmol/L (ref 135–145)
Total Bilirubin: 0.3 mg/dL (ref 0.3–1.2)
Total Protein: 5.3 g/dL — ABNORMAL LOW (ref 6.5–8.1)

## 2021-04-24 LAB — LIPASE, BLOOD
Lipase: 1221 U/L — ABNORMAL HIGH (ref 11–51)
Lipase: 111 U/L — ABNORMAL HIGH (ref 11–51)

## 2021-04-24 LAB — GLUCOSE, CAPILLARY
Glucose-Capillary: 68 mg/dL — ABNORMAL LOW (ref 70–99)
Glucose-Capillary: 127 mg/dL — ABNORMAL HIGH (ref 70–99)
Glucose-Capillary: 123 mg/dL — ABNORMAL HIGH (ref 70–99)
Glucose-Capillary: 111 mg/dL — ABNORMAL HIGH (ref 70–99)

## 2021-04-24 MED ORDER — LORAZEPAM 2 MG/ML IJ SOLN
1.0000 mg | INTRAMUSCULAR | Status: DC | PRN
  Administered 2021-04-25: 4 mg via INTRAVENOUS
  Administered 2021-04-25: 2 mg via INTRAVENOUS
  Filled 2021-04-24: qty 2
  Filled 2021-04-24: qty 1

## 2021-04-24 MED ORDER — IPRATROPIUM-ALBUTEROL 0.5-2.5 (3) MG/3ML IN SOLN
3.0000 mL | Freq: Two times a day (BID) | RESPIRATORY_TRACT | Status: DC
  Administered 2021-04-24: 3 mL via RESPIRATORY_TRACT
  Filled 2021-04-24 (×2): qty 3

## 2021-04-24 MED ORDER — LORAZEPAM 1 MG PO TABS
1.0000 mg | ORAL_TABLET | ORAL | Status: DC | PRN
  Administered 2021-04-24 – 2021-04-25 (×2): 1 mg via ORAL
  Filled 2021-04-24 (×2): qty 1

## 2021-04-24 MED ORDER — LORAZEPAM 2 MG/ML IJ SOLN
1.0000 mg | Freq: Once | INTRAMUSCULAR | Status: AC
Start: 2021-04-24 — End: 2021-04-24
  Administered 2021-04-24: 1 mg via INTRAVENOUS
  Filled 2021-04-24: qty 1

## 2021-04-24 NOTE — Evaluation (Signed)
Physical Therapy Evaluation ?Patient Details ?Name: Kyle Giles ?MRN: [id number] ?DOB: 07/30/1960 ?Today's Date: 04/24/2021 ? ?History of Present Illness ? Kyle Giles  is a 61 y.o. male with past medical history relevant for longstanding history of alcohol and tobacco abuse, HTN, HLD, anxiety disorder DM2 who presents to the ED with worsening abdominal pain and nausea ?  ?Clinical Impression ? Patient limited for functional mobility as stated below secondary to BLE weakness, fatigue and poor standing balance. Patient found standing in middle of room at beginning of session pulling on IV and O2 tubing. Patient demonstrates good sitting balance and tolerance at EOB. He is able to transfer without AD with slow, labored movements but is unsteady upon standing. Patient ambulates without AD with intermittent unsteadiness and drifting/staggering without loss of balance. Gait/balance improves with use of RW but he remains intermittently unsteady. Frequent cueing required for proper RW use. Patient with some confusion throughout session requiring redirection as he is somewhat impulsive.  Patient will benefit from continued physical therapy in hospital and recommended venue below to increase strength, balance, endurance for safe ADLs and gait. ?   ?   ? ?Recommendations for follow up therapy are one component of a multi-disciplinary discharge planning process, led by the attending physician.  Recommendations may be updated based on patient status, additional functional criteria and insurance authorization. ? ?Follow Up Recommendations Home health PT ? ?  ?Assistance Recommended at Discharge Intermittent Supervision/Assistance  ?Patient can return home with the following ? A little help with walking and/or transfers;A little help with bathing/dressing/bathroom;Assistance with cooking/housework;Assist for transportation;Help with stairs or ramp for entrance ? ?  ?Equipment Recommendations Rolling walker (2 wheels)   ?Recommendations for Other Services ?    ?  ?Functional Status Assessment Patient has had a recent decline in their functional status and demonstrates the ability to make significant improvements in function in a reasonable and predictable amount of time.  ? ?  ?Precautions / Restrictions Precautions ?Precautions: Fall ?Restrictions ?Weight Bearing Restrictions: No  ? ?  ? ?Mobility ? Bed Mobility ?  ?  ?  ?  ?  ?  ?  ?General bed mobility comments: seated EOB ?  ? ?Transfers ?Overall transfer level: Needs assistance ?Equipment used: None ?Transfers: Sit to/from Stand ?Sit to Stand: Min guard ?  ?  ?  ?  ?  ?General transfer comment: transfer to standing without AD with slow movements, unsteady upon standing ?  ? ?Ambulation/Gait ?Ambulation/Gait assistance: Min guard ?Gait Distance (Feet): 150 Feet ?Assistive device: None ?Gait Pattern/deviations: Drifts right/left, Decreased stride length ?Gait velocity: decreased ?  ?  ?General Gait Details: intermittent unsteadiness without AD, drifting/staggering slightly, gait/balance improves with RW but intermittent unsteadiness, frequent cueing for RW use ? ?Stairs ?  ?  ?  ?  ?  ? ?Wheelchair Mobility ?  ? ?Modified Rankin (Stroke Patients Only) ?  ? ?  ? ?Balance Overall balance assessment: Needs assistance ?Sitting-balance support: No upper extremity supported, Feet supported ?Sitting balance-Leahy Scale: Good ?Sitting balance - Comments: seated EOB ?  ?Standing balance support: No upper extremity supported ?Standing balance-Leahy Scale: Fair ?Standing balance comment: fair without AD ?  ?  ?  ?  ?  ?  ?  ?  ?  ?  ?  ?   ? ? ? ?Pertinent Vitals/Pain Pain Assessment ?Pain Assessment: Faces ?Faces Pain Scale: Hurts a little bit ?Pain Location: abdomen ?Pain Descriptors / Indicators: Sore ?Pain Intervention(s): Limited activity within patient's tolerance, Monitored  during session  ? ? ?Home Living Family/patient expects to be discharged to:: Private residence ?Living  Arrangements: Spouse/significant other ?Available Help at Discharge: Family ?Type of Home: House ?Home Access: Stairs to enter ?Entrance Stairs-Rails: None ?Entrance Stairs-Number of Steps: 4 ?  ?Home Layout: One level ?Home Equipment: None ?   ?  ?Prior Function Prior Level of Function : Independent/Modified Independent;Driving ?  ?  ?  ?  ?  ?  ?  ?  ?  ? ? ?Hand Dominance  ?   ? ?  ?Extremity/Trunk Assessment  ? Upper Extremity Assessment ?Upper Extremity Assessment: Overall WFL for tasks assessed ?  ? ?Lower Extremity Assessment ?Lower Extremity Assessment: Generalized weakness;Overall [facility] for tasks assessed ?  ? ?Cervical / Trunk Assessment ?Cervical / Trunk Assessment: Normal  ?Communication  ? Communication: No difficulties;Expressive difficulties (slow to respond)  ?Cognition Arousal/Alertness: Awake/alert ?Behavior During Therapy: [facility] for tasks assessed/performed, Impulsive ?Overall Cognitive Status: Within Functional Limits for tasks assessed ?  ?  ?  ?  ?  ?  ?  ?  ?  ?  ?  ?  ?  ?  ?  ?  ?General Comments: states confused, slow to respond, intermittent mumbling  and slurred speech ?  ?  ? ?  ?General Comments   ? ?  ?Exercises    ? ?Assessment/Plan  ?  ?PT Assessment Patient needs continued PT services  ?PT Problem List Decreased strength;Decreased mobility;Decreased activity tolerance;Decreased cognition;Decreased balance;Decreased knowledge of use of DME ? ?   ?  ?PT Treatment Interventions DME instruction;Therapeutic exercise;Gait training;Balance training;Stair training;Neuromuscular re-education;Functional mobility training;Therapeutic activities;Patient/family education   ? ?PT Goals (Current goals can be found in the Care Plan section)  ?Acute Rehab PT Goals ?Patient Stated Goal: Return home ?PT Goal Formulation: With patient ?Time For Goal Achievement: 05/08/21 ?Potential to Achieve Goals: Good ? ?  ?Frequency Min 3X/week ?  ? ? ?Co-evaluation   ?  ?  ?  ?  ? ? ?  ?AM-PAC PT "6 Clicks" Mobility   ?Outcome Measure Help needed turning from your back to your side while in a flat bed without using bedrails?: A Little ?Help needed moving from lying on your back to sitting on the side of a flat bed without using bedrails?: A Little ?Help needed moving to and from a bed to a chair (including a wheelchair)?: A Little ?Help needed standing up from a chair using your arms (e.g., wheelchair or bedside chair)?: A Little ?Help needed to walk in hospital room?: A Little ?Help needed climbing 3-5 steps with a railing? : A Little ?6 Click Score: 18 ? ?  ?End of Session Equipment Utilized During Treatment: Gait belt ?Activity Tolerance: Patient tolerated treatment well;Patient limited by fatigue ?Patient left: in bed;with bed alarm set;with call bell/phone within reach ?Nurse Communication: Mobility status ?PT Visit Diagnosis: Unsteadiness on feet (R26.81);Other abnormalities of gait and mobility (R26.89) ?  ? ?Time: 4158-3094 ?PT Time Calculation (min) (ACUTE ONLY): 15 min ? ? ?Charges:   PT Evaluation ?$PT Eval Low Complexity: 1 Low ?  ?  ?   ? ?9:14 AM, 04/24/21 ?Mearl Latin PT, DPT ?Physical Therapist at [facility] ?[facility] ? ? ?

## 2021-04-24 NOTE — TOC Initial Note (Signed)
Transition of Care (TOC) - Initial/Assessment Note  ? ? ?Patient Details  ?Name: Kyle Giles ?MRN: [id number] ?Date of Birth: Apr 25, 1960 ? ?Transition of Care (TOC) CM/SW Contact:    ?Shade Flood, LCSW ?Phone Number: ?04/24/2021, 2:16 PM ? ?Clinical Narrative:                 ? ?Received request for consult from MD stating pt and his wife are now interested in [place] treatment resources. PT also recommending HHPT at dc. Met with pt and his wife today at bedside to assess. Per pt, he plans to return home at dc. He states that he would be agreeable to HHPT at dc. CMS provider options reviewed. Will refer to in-network provider at pt request. Pt does not feel that he has any DME needs for dc. Discussed AODA concerns and pt states he did receive treatment once about 30 years ago and found it to be helpful. He indicated that he would be interested in trying outpatient rehab again. Verbal and written information provided to pt and his wife. Encouraged pt to follow up.  ? ?[facility] accepted [facility] referral. ? ?TOC will be available if any other needs arise. ? ?Expected Discharge Plan: [place] ?Barriers to Discharge: Continued Medical Work up ? ? ?Patient Goals and CMS Choice ?Patient states their goals for this hospitalization and ongoing recovery are:: go home ?CMS Medicare.gov Compare Post Acute Care list provided to:: Patient ?Choice offered to / list presented to : Patient ? ?Expected Discharge Plan and Services ?Expected Discharge Plan: [place] ?In-house Referral: Clinical Social Work ?  ?Post Acute Care Choice: Home Health ?Living arrangements for the past 2 months: [place] ?                ?  ?  ?  ?  ?  ?HH Arranged: PT ?[place] Agency: [place] ([place]) ?Date HH Agency Contacted: 04/24/21 ?  ?Representative spoke with at [place]: Vaughan Basta ? ?Prior Living Arrangements/Services ?Living arrangements for the past 2 months: [place] ?Lives with::  Spouse ?Patient language and need for interpreter reviewed:: Yes ?Do you feel safe going back to the place where you live?: Yes      ?Need for Family Participation in Patient Care: No (Comment) ?Care giver support system in place?: Yes (comment) ?  ?Criminal Activity/Legal Involvement Pertinent to Current Situation/Hospitalization: No - Comment as needed ? ?Activities of Daily Living ?Home Assistive Devices/Equipment: None ?ADL Screening (condition at time of admission) ?Patient's cognitive ability adequate to safely complete daily activities?: Yes ?Is the patient deaf or have difficulty hearing?: No ?Does the patient have difficulty seeing, even when wearing glasses/contacts?: No ?Does the patient have difficulty concentrating, remembering, or making decisions?: No ?Patient able to express need for assistance with ADLs?: Yes ?Does the patient have difficulty dressing or bathing?: No ?Independently performs ADLs?: Yes (appropriate for developmental age) ?Does the patient have difficulty walking or climbing stairs?: No ?Weakness of Legs: None ?Weakness of Arms/Hands: None ? ?Permission Sought/Granted ?Permission sought to share information with : Customer service manager ?Permission granted to share information with : Yes, Verbal Permission Granted ?   ? Permission granted to share info w AGENCY: HH ?   ?   ? ?Emotional Assessment ?Appearance:: Appears stated age ?Attitude/Demeanor/Rapport: Engaged ?Affect (typically observed): Pleasant ?Orientation: : Oriented to Self, Oriented to Place, Oriented to  Time, Oriented to Situation ?Alcohol / Substance Use: Alcohol Use ?Psych Involvement:  No (comment) ? ?Admission diagnosis:  Pancreatitis, acute [K85.90] ?Generalized abdominal pain [R10.84] ?Nausea and vomiting, unspecified vomiting type [R11.2] ?Patient Active Problem List  ? Diagnosis Date Noted  ? Pancreatitis, acute Alcoholic 81/85/6314  ? Heavy Alcohol abuse 04/21/2021  ? Alcoholic Hepatic steatosis  04/21/2021  ? Tobacco abuse 04/21/2021  ? Special screening for malignant neoplasms, colon   ? Rectal polyp   ? Diverticulosis of large intestine without diverticulitis   ? Epigastric pain 09/11/2010  ? ?PCP:  Sharilyn Sites, MD ?Pharmacy:   ?[place], [place] [address] ?[place] [address] ?Phone: [phone] Fax: [phone] ? ?[facility] ?[address] ?[place] [address] ?Phone: [phone] Fax: [phone] ? ? ? ? ?Social Determinants of Health (SDOH) Interventions ?  ? ?Readmission Risk Interventions ?No flowsheet data found. ? ? ?

## 2021-04-24 NOTE — Progress Notes (Signed)
?PROGRESS NOTE ? ? ? ?Kyle Giles  HYQ:[id number] DOB: 1960/07/22 DOA: 04/21/2021 ?PCP: Sharilyn Sites, MD  ? ? ?Brief Narrative:  ?61 year old male admitted to the hospital with abdominal pain.  Found to have acute alcoholic pancreatitis, acute kidney injury.  Started on IV hydration, bowel rest and pain management.  He is on CIWA protocol and is being watched for any developing alcohol withdrawal ? ? ?Assessment & Plan: ?  ?Principal Problem: ?  Pancreatitis, acute Alcoholic ?Active Problems: ?  Heavy Alcohol abuse ?  Alcoholic Hepatic steatosis ?  Tobacco abuse ? ? ?Acute alcoholic pancreatitis ?-He has been resuscitated with IV fluids ?-Abdominal pain is improving, and he is solid diet ?-Continue pain management ? ? ?Alcohol abuse ?-Last drink was 3/17 ?-Currently on CIWA protocol ?-His wife feels that he may be more confused and diaphoretic ?-CIWA scores trending up and was hallucinating overnight ?-Continue Ativan per CIWA ? ?Acute kidney injury ?-Likely related to volume depletion ?-Creatinine 1.7 on admission ?-Holding olmesartan/hydrochlorothiazide ?-Creatinine improved to 1.0 with IV fluids ?-Renal ultrasound unremarkable ?-Continue IV fluids ? ?Hypertension ?-Blood pressure stable ?-Holding amlodipine ?-Continue Coreg ? ?Duodenitis ?-Continue on PPI ? ?Diabetes ?-Holding metformin ?-A1c 6.6 ?-Blood sugars have been stable ?-Continue to monitor and SSI ? ?Elevated LFTs ?-Likely reactive ?-Viral hepatitis panel negative ?-Improved with IV fluids ? ?Recent positive COVID test ?-Chest x-ray on admission unremarkable ?-overall cough and chest exam is improved ?-He is not febrile, he is out of the window for isolation ?-continue incentive spirometry ? ? ?DVT prophylaxis: heparin injection 5,000 Units Start: 04/21/21 2230 ?SCDs Start: 04/21/21 2222 ?Place TED hose Start: 04/21/21 2222 ? ?Code Status: Full code ?Family Communication: Discussed with patient ?Disposition Plan: Status is: Inpatient ?Remains  inpatient appropriate because: Continued management of pancreatitis ? ? ? ? ?Consultants:  ? ? ?Procedures:  ? ? ?Antimicrobials:  ?  ? ? ?Subjective: ?No vomiting, tolerating solid food. Had some hallucinations overnight. Still has some confusion today ? ?Objective: ?Vitals:  ? 04/24/21 0508 04/24/21 0854 04/24/21 1037 04/24/21 1254  ?BP: (!) 162/76  (!) 145/75 138/66  ?Pulse: (!) 109  94 89  ?Resp: 19   20  ?Temp: 98.5 ?F (36.9 ?C)   98.8 ?F (37.1 ?C)  ?TempSrc: Oral   Oral  ?SpO2: 91% 94%  99%  ?Weight:      ?Height:      ? ? ?Intake/Output Summary (Last 24 hours) at 04/24/2021 1424 ?Last data filed at 04/24/2021 1244 ?Gross per 24 hour  ?Intake 480 ml  ?Output 400 ml  ?Net 80 ml  ? ?Filed Weights  ? 04/21/21 1752  ?Weight: 62.1 kg  ? ? ?Examination: ? ?General exam: Alert, awake, oriented x 3 ?Respiratory system: Clear to auscultation. Respiratory effort normal. ?Cardiovascular system:RRR. No murmurs, rubs, gallops. ?Gastrointestinal system: Abdomen is nondistended, soft and nontender. No organomegaly or masses felt. Normal bowel sounds heard. ?Central nervous system: Alert and oriented. No focal neurological deficits. ?Extremities: No C/C/E, +pedal pulses ?Skin: No rashes, lesions or ulcers ?Psychiatry: Judgement and insight appear normal. Mood & affect appropriate.  ? ? ? ? ?Data Reviewed: I have personally reviewed following labs and imaging studies ? ?CBC: ?Recent Labs  ?Lab 04/21/21 ?1836 04/22/21 ?9528 04/23/21 ?0414 04/24/21 ?4132  ?WBC 20.1* 14.3* 10.3 5.5  ?NEUTROABS 17.7*  --   --   --   ?HGB 15.0 14.7 11.5* 9.3*  ?HCT 46.3 44.9 36.9* 30.0*  ?MCV 93.9 96.8 100.3* 98.4  ?PLT 455* 358 246 203  ? ?  Basic Metabolic Panel: ?Recent Labs  ?Lab 04/21/21 ?1836 04/22/21 ?4174 04/23/21 ?0414 04/24/21 ?0814  ?NA 134* 140 139 141  ?K 4.2 4.4 4.5 3.9  ?CL 94* 107 108 110  ?CO2 25 24 22 24   ?GLUCOSE 243* 143* 100* 83  ?BUN 35* 34* 50* 37*  ?CREATININE 1.73* 1.37* 1.72* 1.05  ?CALCIUM 12.3* 9.6 9.2 8.5*  ?MG  --  1.7  --    --   ? ?GFR: ?Estimated Creatinine Clearance: 56.3 mL/min (by C-G formula based on SCr of 1.05 mg/dL). ?Liver Function Tests: ?Recent Labs  ?Lab 04/21/21 ?1836 04/22/21 ?4818 04/24/21 ?5631  ?AST 47* 37 24  ?ALT 45* 30 25  ?ALKPHOS 33* 22* 24*  ?BILITOT 0.8 0.5 0.3  ?PROT 8.0 5.7* 5.3*  ?ALBUMIN 4.6 3.1* 2.5*  ? ?Recent Labs  ?Lab 04/21/21 ?1836 04/24/21 ?4970  ?LIPASE 1,501* 111*  ? ?No results for input(s): AMMONIA in the last 168 hours. ?Coagulation Profile: ?Recent Labs  ?Lab 04/21/21 ?1948  ?INR 1.0  ? ?Cardiac Enzymes: ?No results for input(s): CKTOTAL, CKMB, CKMBINDEX, TROPONINI in the last 168 hours. ?BNP (last 3 results) ?No results for input(s): PROBNP in the last 8760 hours. ?HbA1C: ?Recent Labs  ?  04/21/21 ?1948  ?HGBA1C 6.6*  ? ?CBG: ?Recent Labs  ?Lab 04/23/21 ?1057 04/23/21 ?1621 04/23/21 ?2121 04/24/21 ?0705 04/24/21 ?1110  ?GLUCAP 91 100* 104* 68* 127*  ? ?Lipid Profile: ?No results for input(s): CHOL, HDL, LDLCALC, TRIG, CHOLHDL, LDLDIRECT in the last 72 hours. ?Thyroid Function Tests: ?No results for input(s): TSH, T4TOTAL, FREET4, T3FREE, THYROIDAB in the last 72 hours. ?Anemia Panel: ?No results for input(s): VITAMINB12, FOLATE, FERRITIN, TIBC, IRON, RETICCTPCT in the last 72 hours. ?Sepsis Labs: ?Recent Labs  ?Lab 04/21/21 ?1948 04/21/21 ?2124 04/23/21 ?0414  ?LATICACIDVEN 2.5* 3.5* 1.1  ? ? ?Recent Results (from the past 240 hour(s))  ?Blood Culture (routine x 2)     Status: None (Preliminary result)  ? Collection Time: 04/21/21  7:49 PM  ? Specimen: Right Antecubital; Blood  ?Result Value Ref Range Status  ? Specimen Description   Final  ?  RIGHT ANTECUBITAL BOTTLES DRAWN AEROBIC AND ANAEROBIC  ? Special Requests Blood Culture adequate volume  Final  ? Culture   Final  ?  NO GROWTH 3 DAYS ?Performed at [facility], [address], [place], [place] [address] ?  ? Report Status PENDING  Incomplete  ?Blood Culture (routine x 2)     Status: None (Preliminary result)  ? Collection Time:  04/21/21  7:55 PM  ? Specimen: BLOOD LEFT HAND  ?Result Value Ref Range Status  ? Specimen Description BLOOD LEFT HAND BOTTLES DRAWN AEROBIC ONLY  Final  ? Special Requests Blood Culture adequate volume  Final  ? Culture   Final  ?  NO GROWTH 3 DAYS ?Performed at [facility], [address] [place], [place] [address] ?  ? Report Status PENDING  Incomplete  ?Urine Culture     Status: None  ? Collection Time: 04/21/21 10:44 PM  ? Specimen: Urine, Clean Catch  ?Result Value Ref Range Status  ? Specimen Description   Final  ?  URINE, CLEAN CATCH ?Performed at [facility], [address], [place], [place] [address] ?  ? Special Requests   Final  ?  NONE ?Performed at [facility], [address], [place],  [address] ?  ? Culture   Final  ?  NO GROWTH ?Performed at [facility], [place],  [place] [address] ?  ? Report Status 04/23/2021 FINAL  Final  ?  ? ? ? ? ? ?Radiology Studies: ?DG Chest 1 View ? ?Result Date: 04/23/2021 ?CLINICAL DATA:  Abdominal pain, pancreatitis EXAM: CHEST  1 VIEW COMPARISON:  Chest radiograph 04/22/2021 FINDINGS: The cardiomediastinal silhouette is stable. Lung volumes are mildly low. There are new patchy opacities in the medial right base and reticular opacities in the left base. There is no other focal consolidation. There is no pulmonary edema. There is no pleural effusion or pneumothorax. There is no acute osseous abnormality. IMPRESSION: New opacities in the medial right base and reticular opacities in the left base may reflect atelectasis, though developing infection or aspiration can not be excluded. Electronically Signed   By: Valetta Mole M.D.   On: 04/23/2021 15:23  ? ?US RENAL ? ?Result Date: 04/23/2021 ?CLINICAL DATA:  Acute kidney injury.  Hypertension and diabetes. EXAM: RENAL / URINARY TRACT ULTRASOUND COMPLETE COMPARISON:  CT 2 days ago. FINDINGS: Right Kidney: Renal measurements: 11.7 x 5.2 x 4.8 cm = volume: 155 mL. Echogenicity within normal limits.  No mass or hydronephrosis visualized. Left Kidney: Renal measurements: 12.2 x 6.9 x 6.4 cm = volume: 280 for mL. Echogenicity within normal limits. No hydronephrosis. 1.2 cm cyst in the upper pole. Bladder: Appears

## 2021-04-24 NOTE — Plan of Care (Signed)
?  Problem: Acute Rehab PT Goals(only PT should resolve) ?Goal: Patient Will Transfer Sit To/From Stand ?Outcome: Progressing ?Flowsheets (Taken 04/24/2021 0915) ?Patient will transfer sit to/from stand: with modified independence ?Goal: Pt Will Transfer Bed To Chair/Chair To Bed ?Outcome: Progressing ?Flowsheets (Taken 04/24/2021 0915) ?Pt will Transfer Bed to Chair/Chair to Bed: with modified independence ?Goal: Pt Will Ambulate ?Outcome: Progressing ?Flowsheets (Taken 04/24/2021 0915) ?Pt will Ambulate: ? > 125 feet ? with modified independence ? with least restrictive assistive device ?Goal: Pt/caregiver will Perform Home Exercise Program ?Outcome: Progressing ?Flowsheets (Taken 04/24/2021 0915) ?Pt/caregiver will Perform Home Exercise Program: ? For increased strengthening ? For improved balance ? Independently ? 9:15 AM, 04/24/21 ?Mearl Latin PT, DPT ?Physical Therapist at [facility] ?[facility] ? ?

## 2021-04-25 LAB — BASIC METABOLIC PANEL
Anion gap: 8 (ref 5–15)
BUN: 21 mg/dL (ref 8–23)
CO2: 22 mmol/L (ref 22–32)
Calcium: 8.1 mg/dL — ABNORMAL LOW (ref 8.9–10.3)
Chloride: 111 mmol/L (ref 98–111)
Creatinine, Ser: 0.87 mg/dL (ref 0.61–1.24)
GFR, Estimated: >60 mL/min (ref >60)
Glucose, Bld: 81 mg/dL (ref 70–99)
Potassium: 3.4 mmol/L — ABNORMAL LOW (ref 3.5–5.1)
Sodium: 141 mmol/L (ref 135–145)

## 2021-04-25 LAB — CBC
HCT: 27.8 % — ABNORMAL LOW (ref 39.0–52.0)
Hemoglobin: 9.1 g/dL — ABNORMAL LOW (ref 13.0–17.0)
MCH: 31.7 pg (ref 26.0–34.0)
MCHC: 32.7 g/dL (ref 30.0–36.0)
MCV: 96.9 fL (ref 80.0–100.0)
Platelets: 225 10*3/uL (ref 150–400)
RBC: 2.87 MIL/uL — ABNORMAL LOW (ref 4.22–5.81)
RDW: 14.6 % (ref 11.5–15.5)
WBC: 9.6 10*3/uL (ref 4.0–10.5)
nRBC: 0 % (ref 0.0–0.2)

## 2021-04-25 LAB — GLUCOSE, CAPILLARY: Glucose-Capillary: 75 mg/dL (ref 70–99)

## 2021-04-25 MED ORDER — ALPRAZOLAM 1 MG PO TABS: 1.0000 mg | ORAL_TABLET | Freq: Three times a day (TID) | ORAL | 0 refills | Status: DC | PRN

## 2021-04-25 MED ORDER — POTASSIUM CHLORIDE CRYS ER 20 MEQ PO TBCR
40.0000 meq | EXTENDED_RELEASE_TABLET | Freq: Once | ORAL | Status: AC
Start: 2021-04-25 — End: 2021-04-25
  Administered 2021-04-25: 40 meq via ORAL
  Filled 2021-04-25: qty 2

## 2021-04-25 MED ORDER — ATORVASTATIN CALCIUM 10 MG PO TABS: 10.0000 mg | ORAL_TABLET | Freq: Every day | ORAL | 11 refills | Status: DC

## 2021-04-25 MED ORDER — THIAMINE HCL 100 MG PO TABS
100.0000 mg | ORAL_TABLET | Freq: Every day | ORAL | 0 refills | Status: AC
Start: 2021-04-25 — End: 2021-05-25

## 2021-04-25 MED ORDER — ADULT MULTIVITAMIN W/MINERALS CH: 1.0000 | ORAL_TABLET | Freq: Every day | ORAL | 0 refills | Status: AC

## 2021-04-25 MED ORDER — FOLIC ACID 1 MG PO TABS: 1.0000 mg | ORAL_TABLET | Freq: Every day | ORAL | 0 refills | Status: AC

## 2021-04-25 NOTE — Progress Notes (Signed)
Discharge instructions reviewed with patient and patient's wife Kyle Giles.  Mrs. Gladu verbalized understanding of instructions and signs/symptoms of withdrawal.  Patient discharged home with wife in stable condition.   ?

## 2021-04-25 NOTE — TOC Transition Note (Signed)
Transition of Care (TOC) - CM/SW Discharge Note ? ? ?Patient Details  ?Name: Kyle Giles ?MRN: [id number] ?Date of Birth: 1960-08-16 ? ?Transition of Care (TOC) CM/SW Contact:  ?Salome Arnt, LCSW ?Phone Number: ?04/25/2021, 9:32 AM ? ? ?Clinical Narrative:  Pt d/c today. Per [facility], pt will have copay of $30-130 per visit. Pt and wife notified and are no longer interested in home health. Discussed outpatient PT, but pt's wife requests for pt to return home and see how he does. She is aware they will have to contact PCP if outpatient PT needed after d/c. Pt has rolling walker at home. No other DME recommended. Pt's wife will pick up pt today.   ? ? ? ?Final next level of care: Home/Self Care ?Barriers to Discharge: Barriers Resolved ? ? ?Patient Goals and CMS Choice ?Patient states their goals for this hospitalization and ongoing recovery are:: go home ?CMS Medicare.gov Compare Post Acute Care list provided to:: Patient ?Choice offered to / list presented to : Patient, Spouse ? ?Discharge Placement ?  ?           ?  ?  ?Name of family member notified: wife ?Patient and family notified of of transfer: 04/25/21 ? ?Discharge Plan and Services ?In-house Referral: Clinical Social Work ?  ?Post Acute Care Choice: Home Health          ?  ?  ?  ?  ?  ?HH Arranged: PT ?[facility] Agency: [facility] ([place]) ?Date HH Agency Contacted: 04/24/21 ?  ?Representative spoke with at [facility]: Vaughan Basta ? ?Social Determinants of Health (SDOH) Interventions ?  ? ? ?Readmission Risk Interventions ?   ? View : No data to display.  ?  ?  ?  ? ? ? ? ? ?

## 2021-04-25 NOTE — Discharge Summary (Signed)
Physician Discharge Summary  ?Kyle Giles OZD:[id number] DOB: 21-Sep-1960 DOA: 04/21/2021 ? ?PCP: Sharilyn Sites, MD ? ?Admit date: 04/21/2021 ? ?Discharge date: 04/25/2021 ? ?Admitted From:Home ? ?Disposition:  Home ? ?Recommendations for Outpatient Follow-up:  ?Follow up with PCP in 1-2 weeks ?Continue on Xanax as needed for anxiety or withdrawal symptoms ?Continue other home medications as prior ?Simvastatin changed to atorvastatin to prevent interaction with diltiazem ? ?Home Health: Yes with PT ? ?Equipment/Devices: None ? ?Discharge Condition:Stable ? ?CODE STATUS: Full ? ?Diet recommendation: Heart Healthy/carb modified ? ?Brief/Interim Summary: ?61 year old male admitted to the hospital with abdominal pain.  Found to have acute alcoholic pancreatitis, acute kidney injury.  Started on IV hydration, bowel rest and pain management.  He was started on CIWA protocol and was noted to have some alcohol withdrawal symptoms with confusion and diaphoresis as well as some mild tachycardia.  He was also noted to have some mild hallucination that was being observed.  On the morning of discharge, he is back to baseline level of functioning with no further confusion or hallucinations.  His AKI has resolved and he is now tolerating diet.  He has discussed alcohol abuse programs with TOC and has been given information regarding this as well as a refill on his home Xanax as needed for anxiety and withdrawal symptoms.  He will be set up with home health physical therapy and no other acute events have been otherwise noted. ? ?Discharge Diagnoses:  ?Principal Problem: ?  Pancreatitis, acute Alcoholic ?Active Problems: ?  Heavy Alcohol abuse ?  Alcoholic Hepatic steatosis ?  Tobacco abuse ? ?Principal discharge diagnosis: Acute alcoholic pancreatitis with AKI.  Mild DTs. ? ?Discharge Instructions ? ?Discharge Instructions   ? ? Diet - low sodium heart healthy   Complete by: As directed ?  ? Increase activity slowly   Complete by: As  directed ?  ? ?  ? ?Allergies as of 04/25/2021   ?No Known Allergies ?  ? ?  ?Medication List  ?  ? ?STOP taking these medications   ? ?amoxicillin 875 MG tablet ?Commonly known as: AMOXIL ?  ?predniSONE 10 MG tablet ?Commonly known as: DELTASONE ?  ?simvastatin 20 MG tablet ?Commonly known as: ZOCOR ?  ? ?  ? ?TAKE these medications   ? ?acetaminophen 500 MG tablet ?Commonly known as: TYLENOL ?Take 500 mg by mouth every 6 (six) hours as needed for moderate pain or headache. Pain ?  ?albuterol 108 (90 Base) MCG/ACT inhaler ?Commonly known as: VENTOLIN HFA ?Inhale 1 or 2 puffs every 4 hours as needed ?  ?ALPRAZolam 1 MG tablet ?Commonly known as: Xanax ?Take 1 tablet (1 mg total) by mouth 3 (three) times daily as needed for anxiety. ?What changed:  ?reasons to take this ?Another medication with the same name was removed. Continue taking this medication, and follow the directions you see here. ?  ?amLODipine 10 MG tablet ?Commonly known as: NORVASC ?Take 10 mg by mouth every evening. ?  ?atorvastatin 10 MG tablet ?Commonly known as: Lipitor ?Take 1 tablet (10 mg total) by mouth daily. ?  ?benzonatate 100 MG capsule ?Commonly known as: TESSALON ?Take 1 capsule (100 mg total) by mouth 3 (three) times daily as needed. ?  ?cetirizine 10 MG tablet ?Commonly known as: ZYRTEC ?TAKE (1) TABLET BY MOUTH ONCE DAILY. ?  ?Denta 5000 Plus 1.1 % Crea dental cream ?Generic drug: sodium fluoride ?Take 1 application by mouth 2 (two) times daily. ?  ?sodium fluoride 1.1 %  Gel dental gel ?Commonly known as: PreviDent 5000 Dry Mouth ?use to brush teeth once daily at night ?  ?diltiazem 360 MG 24 hr capsule ?Commonly known as: CARDIZEM CD ?Take 1 capsule (360 mg total) by mouth every evening. ?  ?fenofibrate 160 MG tablet ?TAKE ONE TABLET BY MOUTH ONCE DAILY. ?What changed: Another medication with the same name was removed. Continue taking this medication, and follow the directions you see here. ?  ?folic acid 1 MG tablet ?Commonly known  as: FOLVITE ?Take 1 tablet (1 mg total) by mouth daily. ?  ?HYDROCODONE-CHLORPHENIRAMINE PO ?Take 5 mLs by mouth every 12 (twelve) hours as needed (cough). ?  ?ibuprofen 200 MG tablet ?Commonly known as: ADVIL ?Take 200 mg by mouth every 6 (six) hours as needed for headache or moderate pain. ?  ?metFORMIN 500 MG tablet ?Commonly known as: GLUCOPHAGE ?Take 250 mg by mouth 2 (two) times daily with a meal. ?What changed: Another medication with the same name was removed. Continue taking this medication, and follow the directions you see here. ?  ?multivitamin with minerals Tabs tablet ?Take 1 tablet by mouth daily. ?  ?olmesartan 40 MG tablet ?Commonly known as: BENICAR ?Take 40 mg by mouth daily. ?What changed: Another medication with the same name was removed. Continue taking this medication, and follow the directions you see here. ?  ?olmesartan-hydrochlorothiazide 40-25 MG tablet ?Commonly known as: BENICAR HCT ?Take 1 tablet by mouth once a day ?  ?PRESCRIPTION MEDICATION ?Apply 1 application topically daily as needed (pain). Compounded pain cream diclofenac 3%, baclofen 2%, gabapentin 5%, lidocaine 5%, menthol  1% ?  ?promethazine-dextromethorphan 6.25-15 MG/5ML syrup ?Commonly known as: PROMETHAZINE-DM ?Take 5 mLs by mouth 4 (four) times daily as needed. ?  ?Systane Complete 0.6 % Soln ?Generic drug: Propylene Glycol ?Place 1 drop into both eyes 2 (two) times daily. ?  ?thiamine 100 MG tablet ?Take 1 tablet (100 mg total) by mouth daily. ?  ?Vitamin D (Ergocalciferol) 1.25 MG (50000 UNIT) Caps capsule ?Commonly known as: DRISDOL ?Take 1 capsule by mouth once a week for 12 weeks then take 2000 units otc daily ?  ? ?  ? ? Follow-up Information   ? ? [facility]-Home Follow up.   ?Specialty: Home Health Services ?Why: [facility] staff will call you to schedule in home physical therapy visits. You can call your insurance to inquire on what your co-pay per visit will be. ? ?  ?  ? ? Sharilyn Sites, MD. Schedule an appointment as soon as possible for a visit in 1 week(s).   ?Specialty: Family Medicine ?Contact information: ?[address] ?[place] [address] ?[phone] ? ? ?  ?  ? ?  ?  ? ?  ? ?No Known Allergies ? ?Consultations: ?None ? ? ?Procedures/Studies: ?DG Chest 1 View ? ?Result Date: 04/23/2021 ?CLINICAL DATA:  Abdominal pain, pancreatitis EXAM: CHEST  1 VIEW COMPARISON:  Chest radiograph 04/22/2021 FINDINGS: The cardiomediastinal silhouette is stable. Lung volumes are mildly low. There are new patchy opacities in the medial right base and reticular opacities in the left base. There is no other focal consolidation. There is no pulmonary edema. There is no pleural effusion or pneumothorax. There is no acute osseous abnormality. IMPRESSION: New opacities in the medial right base and reticular opacities in the left base may reflect atelectasis, though developing infection or aspiration can not be excluded. Electronically Signed   By: Valetta Mole M.D.   On: 04/23/2021 15:23  ? ?  DG Chest 2 View ? ?Result Date: 04/19/2021 ?CLINICAL DATA:  Post covid-19 condition, unspecified EXAM: CHEST - 2 VIEW COMPARISON:  April 04, 2016 FINDINGS: The cardiomediastinal silhouette is within normal limits. No pleural effusion. No pneumothorax. No mass or consolidation. No acute osseous abnormality. IMPRESSION: No acute findings in the chest. Electronically Signed   By: Albin Felling M.D.   On: 04/19/2021 10:16  ? ?CT Abdomen Pelvis W Contrast ? ?Result Date: 04/21/2021 ?CLINICAL DATA:  Bowel obstruction. Abdominal pain, acute, nonlocalized. EXAM: CT ABDOMEN AND PELVIS WITH CONTRAST TECHNIQUE: Multidetector CT imaging of the abdomen and pelvis was performed using the standard protocol following bolus administration of intravenous contrast. RADIATION DOSE REDUCTION: This exam was performed according to the departmental dose-optimization program which includes automated exposure control, adjustment of the mA  and/or kV according to patient size and/or use of iterative reconstruction technique. CONTRAST:  57m OMNIPAQUE IOHEXOL 300 MG/ML  SOLN COMPARISON:  None. FINDINGS: Lower chest: No acute abnormality. Hep

## 2021-04-26 LAB — CULTURE, BLOOD (ROUTINE X 2)
Culture: NO GROWTH
Culture: NO GROWTH
Special Requests: ADEQUATE
Special Requests: ADEQUATE

## 2021-04-27 ENCOUNTER — Other Ambulatory Visit (HOSPITAL_COMMUNITY): Payer: Self-pay

## 2021-04-27 MED ORDER — FENOFIBRATE 160 MG PO TABS
ORAL_TABLET | ORAL | 2 refills | Status: DC
  Filled 2021-04-27: qty 90, 90d supply, fill #0
  Filled 2021-08-09: qty 90, 90d supply, fill #1
  Filled 2021-11-19: qty 90, 90d supply, fill #2

## 2021-04-30 DIAGNOSIS — Z6821 Body mass index (BMI) 21.0-21.9, adult: Secondary | ICD-10-CM | POA: Diagnosis not present

## 2021-04-30 DIAGNOSIS — K859 Acute pancreatitis without necrosis or infection, unspecified: Secondary | ICD-10-CM | POA: Diagnosis not present

## 2021-04-30 DIAGNOSIS — F1020 Alcohol dependence, uncomplicated: Secondary | ICD-10-CM | POA: Diagnosis not present

## 2021-05-03 ENCOUNTER — Other Ambulatory Visit (HOSPITAL_COMMUNITY): Payer: Self-pay

## 2021-05-04 ENCOUNTER — Other Ambulatory Visit (HOSPITAL_COMMUNITY): Payer: Self-pay

## 2021-05-11 ENCOUNTER — Other Ambulatory Visit (HOSPITAL_COMMUNITY): Payer: Self-pay

## 2021-05-18 ENCOUNTER — Other Ambulatory Visit (HOSPITAL_COMMUNITY): Payer: Self-pay

## 2021-05-18 DIAGNOSIS — F419 Anxiety disorder, unspecified: Secondary | ICD-10-CM | POA: Diagnosis not present

## 2021-05-18 DIAGNOSIS — Z6821 Body mass index (BMI) 21.0-21.9, adult: Secondary | ICD-10-CM | POA: Diagnosis not present

## 2021-05-18 MED ORDER — ALPRAZOLAM 1 MG PO TABS
ORAL_TABLET | ORAL | 2 refills | Status: DC
  Filled 2021-05-23: qty 270, 90d supply, fill #0

## 2021-05-23 ENCOUNTER — Other Ambulatory Visit (HOSPITAL_COMMUNITY): Payer: Self-pay

## 2021-05-30 ENCOUNTER — Other Ambulatory Visit: Payer: Self-pay

## 2021-05-30 ENCOUNTER — Encounter (HOSPITAL_COMMUNITY): Payer: Self-pay

## 2021-05-30 ENCOUNTER — Emergency Department (HOSPITAL_COMMUNITY)
Admission: EM | Admit: 2021-05-30 | Discharge: 2021-05-30 | Disposition: A | Discharge status: Home / Self Care | Payer: 59 | Attending: Emergency Medicine | Admitting: Emergency Medicine

## 2021-05-30 DIAGNOSIS — K859 Acute pancreatitis without necrosis or infection, unspecified: Secondary | ICD-10-CM | POA: Insufficient documentation

## 2021-05-30 DIAGNOSIS — E782 Mixed hyperlipidemia: Secondary | ICD-10-CM | POA: Diagnosis not present

## 2021-05-30 DIAGNOSIS — I10 Essential (primary) hypertension: Secondary | ICD-10-CM | POA: Diagnosis not present

## 2021-05-30 DIAGNOSIS — R748 Abnormal levels of other serum enzymes: Secondary | ICD-10-CM | POA: Diagnosis not present

## 2021-05-30 DIAGNOSIS — E1165 Type 2 diabetes mellitus with hyperglycemia: Secondary | ICD-10-CM | POA: Diagnosis not present

## 2021-05-30 DIAGNOSIS — Z79899 Other long term (current) drug therapy: Secondary | ICD-10-CM | POA: Diagnosis not present

## 2021-05-30 DIAGNOSIS — Z6821 Body mass index (BMI) 21.0-21.9, adult: Secondary | ICD-10-CM | POA: Diagnosis not present

## 2021-05-30 DIAGNOSIS — R109 Unspecified abdominal pain: Secondary | ICD-10-CM | POA: Diagnosis not present

## 2021-05-30 DIAGNOSIS — R1013 Epigastric pain: Secondary | ICD-10-CM | POA: Diagnosis present

## 2021-05-30 HISTORY — DX: Acute pancreatitis without necrosis or infection, unspecified: K85.90

## 2021-05-30 LAB — CBC
HCT: 34.7 % — ABNORMAL LOW (ref 39.0–52.0)
Hemoglobin: 11 g/dL — ABNORMAL LOW (ref 13.0–17.0)
MCH: 31.4 pg (ref 26.0–34.0)
MCHC: 31.7 g/dL (ref 30.0–36.0)
MCV: 99.1 fL (ref 80.0–100.0)
Platelets: 438 10*3/uL — ABNORMAL HIGH (ref 150–400)
RBC: 3.5 MIL/uL — ABNORMAL LOW (ref 4.22–5.81)
RDW: 14.6 % (ref 11.5–15.5)
WBC: 13 10*3/uL — ABNORMAL HIGH (ref 4.0–10.5)
nRBC: 0 % (ref 0.0–0.2)

## 2021-05-30 LAB — COMPREHENSIVE METABOLIC PANEL
ALT: 56 U/L — ABNORMAL HIGH (ref 0–44)
AST: 70 U/L — ABNORMAL HIGH (ref 15–41)
Albumin: 4.2 g/dL (ref 3.5–5.0)
Alkaline Phosphatase: 30 U/L — ABNORMAL LOW (ref 38–126)
Anion gap: 10 (ref 5–15)
BUN: 32 mg/dL — ABNORMAL HIGH (ref 8–23)
CO2: 23 mmol/L (ref 22–32)
Calcium: 10.2 mg/dL (ref 8.9–10.3)
Chloride: 108 mmol/L (ref 98–111)
Creatinine, Ser: 1.09 mg/dL (ref 0.61–1.24)
GFR, Estimated: >60 mL/min (ref >60)
Glucose, Bld: 141 mg/dL — ABNORMAL HIGH (ref 70–99)
Potassium: 3.9 mmol/L (ref 3.5–5.1)
Sodium: 141 mmol/L (ref 135–145)
Total Bilirubin: 0.7 mg/dL (ref 0.3–1.2)
Total Protein: 7.6 g/dL (ref 6.5–8.1)

## 2021-05-30 LAB — LIPASE, BLOOD: Lipase: 168 U/L — ABNORMAL HIGH (ref 11–51)

## 2021-05-30 MED ORDER — ONDANSETRON HCL 4 MG/2ML IJ SOLN
4.0000 mg | Freq: Once | INTRAMUSCULAR | Status: AC
Start: 2021-05-30 — End: 2021-05-30
  Administered 2021-05-30: 4 mg via INTRAVENOUS
  Filled 2021-05-30: qty 2

## 2021-05-30 MED ORDER — OXYCODONE-ACETAMINOPHEN 5-325 MG PO TABS: 1.0000 | ORAL_TABLET | Freq: Four times a day (QID) | ORAL | 0 refills | Status: DC | PRN

## 2021-05-30 MED ORDER — ONDANSETRON 4 MG PO TBDP: 4.0000 mg | ORAL_TABLET | Freq: Three times a day (TID) | ORAL | 0 refills | Status: DC | PRN

## 2021-05-30 MED ORDER — SODIUM CHLORIDE 0.9 % IV BOLUS
1000.0000 mL | Freq: Once | INTRAVENOUS | Status: AC
  Administered 2021-05-30: 1000 mL via INTRAVENOUS

## 2021-05-30 MED ORDER — MORPHINE SULFATE (PF) 4 MG/ML IV SOLN
4.0000 mg | Freq: Once | INTRAVENOUS | Status: AC
  Administered 2021-05-30: 4 mg via INTRAVENOUS
  Filled 2021-05-30: qty 1

## 2021-05-30 NOTE — Discharge Instructions (Signed)
Follow a clear liquid diet for the next 2 to 3 days.  Advance gradually to a regular diet as your pain improves. ? ?Call your primary care doctor or GI physician as discussed in the next 2-3 days.   ?Return immediately back to the ER if: ? ?Your symptoms worsen within the next 12-24 hours. ?You develop new symptoms such as new fevers, persistent vomiting, new pain, shortness of breath, or new weakness or numbness, or if you have any other concerns. ? ?

## 2021-05-30 NOTE — ED Provider Notes (Addendum)
?[facility] ?Provider Note ? ? ?CSN: [id number] ?Arrival date & time: 05/30/21  0830 ? ?  ? ?History ? ?Chief Complaint  ?Patient presents with  ? Abdominal Pain  ? ? ?Kyle Giles is a 61 y.o. male. ? ?Presents chief complaint abdominal pain.  Describes as epigastric in nature and sharp and aching.  States is similar to prior episodes of pancreatitis.  His prior episode of pancreatitis was thought to be caused by drinking alcohol, however he denies any recent alcoholic beverages.  Denies any fevers or cough vomiting diarrhea.  Currently states the pain is 2 out of 10 but at times it increases in severity at home. ? ? ?  ? ?Home Medications ?Prior to Admission medications   ?Medication Sig Start Date End Date Taking? Authorizing Provider  ?albuterol (VENTOLIN HFA) 108 (90 Base) MCG/ACT inhaler Inhale 1 or 2 puffs every 4 hours as needed 12/06/20  Yes   ?ALPRAZolam (XANAX) 1 MG tablet Take 1 tablet by mouth three times daily. 05/18/21  Yes   ?cetirizine (ZYRTEC) 10 MG tablet TAKE (1) TABLET BY MOUTH ONCE DAILY. 08/04/20  Yes   ?diltiazem (CARDIZEM CD) 360 MG 24 hr capsule Take 1 capsule (360 mg total) by mouth every evening. 08/04/20  Yes   ?fenofibrate 160 MG tablet Take 1 tablet by mouth daily 04/27/21  Yes   ?metFORMIN (GLUCOPHAGE) 500 MG tablet Take 250 mg by mouth 2 (two) times daily with a meal.   Yes [provider]  ?olmesartan-hydrochlorothiazide (BENICAR HCT) 40-25 MG tablet Take 1 tablet by mouth once a day 02/19/21  Yes   ?ondansetron (ZOFRAN-ODT) 4 MG disintegrating tablet Take 1 tablet (4 mg total) by mouth every 8 (eight) hours as needed for nausea or vomiting. 05/30/21  Yes [place], Greggory Brandy, MD  ?oxyCODONE-acetaminophen (PERCOCET/ROXICET) 5-325 MG tablet Take 1 tablet by mouth every 6 (six) hours as needed for up to 8 doses for severe pain. 05/30/21  Yes Luna Fuse, MD  ?simvastatin (ZOCOR) 20 MG tablet Take 20 mg by mouth daily.   Yes [provider]  ?ALPRAZolam  (XANAX) 1 MG tablet Take 1 tablet (1 mg total) by mouth 3 (three) times daily as needed for anxiety. ?Patient not taking: Reported on 05/30/2021 04/25/21   Heath Lark D, DO  ?atorvastatin (LIPITOR) 10 MG tablet Take 1 tablet (10 mg total) by mouth daily. ?Patient not taking: Reported on 05/30/2021 04/25/21 04/25/22  Heath Lark D, DO  ?benzonatate (TESSALON) 100 MG capsule Take 1 capsule (100 mg total) by mouth 3 (three) times daily as needed. ?Patient not taking: Reported on 05/30/2021 04/06/21   [name], PA-C  ?promethazine-dextromethorphan (PROMETHAZINE-DM) 6.25-15 MG/5ML syrup Take 5 mLs by mouth 4 (four) times daily as needed. ?Patient not taking: Reported on 04/22/2021 04/06/21   [name], PA-C  ?sodium fluoride (PREVIDENT 5000 DRY MOUTH) 1.1 % GEL dental gel use to brush teeth once daily at night ?Patient not taking: Reported on 05/30/2021 10/12/20     ?Vitamin D, Ergocalciferol, (DRISDOL) 1.25 MG (50000 UNIT) CAPS capsule Take 1 capsule by mouth once a week for 12 weeks then take 2000 units otc daily ?Patient not taking: Reported on 04/22/2021 02/20/21     ?   ? ?Allergies    ?Patient has no known allergies.   ? ?Review of Systems   ?Review of Systems  ?Constitutional:  Negative for fever.  ?HENT:  Negative for ear pain and sore throat.   ?Eyes:  Negative for pain.  ?Respiratory:  Negative for cough.   ?Cardiovascular:  Negative for chest pain.  ?Gastrointestinal:  Positive for abdominal pain.  ?Genitourinary:  Negative for flank pain.  ?Musculoskeletal:  Negative for back pain.  ?Skin:  Negative for color change and rash.  ?Neurological:  Negative for syncope.  ?All other systems reviewed and are negative. ? ?Physical Exam ?Updated Vital Signs ?BP 127/65   Pulse 73   Temp 98.1 ?F (36.7 ?C) (Oral)   Resp 19   Ht 5' 2"  (1.575 m)   Wt 52.2 kg   SpO2 93%   BMI 21.03 kg/m?  ?Physical Exam ?Constitutional:   ?   Appearance: He is well-developed.  ?HENT:  ?   Head: Normocephalic.  ?   Nose: Nose  normal.  ?Eyes:  ?   Extraocular Movements: Extraocular movements intact.  ?Cardiovascular:  ?   Rate and Rhythm: Normal rate.  ?Pulmonary:  ?   Effort: Pulmonary effort is normal.  ?Abdominal:  ?   Comments: Mild to moderate tenderness in epigastric region.  No guarding or rebound noted.  ?Skin: ?   Coloration: Skin is not jaundiced.  ?Neurological:  ?   Mental Status: He is alert. Mental status is at baseline.  ? ? ?ED Results / Procedures / Treatments   ?Labs ?(all labs ordered are listed, but only abnormal results are displayed) ?Labs Reviewed  ?LIPASE, BLOOD - Abnormal; Notable for the following components:  ?    Result Value  ? Lipase 168 (*)   ? All other components within normal limits  ?COMPREHENSIVE METABOLIC PANEL - Abnormal; Notable for the following components:  ? Glucose, Bld 141 (*)   ? BUN 32 (*)   ? AST 70 (*)   ? ALT 56 (*)   ? Alkaline Phosphatase 30 (*)   ? All other components within normal limits  ?CBC - Abnormal; Notable for the following components:  ? WBC 13.0 (*)   ? RBC 3.50 (*)   ? Hemoglobin 11.0 (*)   ? HCT 34.7 (*)   ? Platelets 438 (*)   ? All other components within normal limits  ?URINALYSIS, ROUTINE W REFLEX MICROSCOPIC  ? ? ?EKG ?None ? ?Radiology ?No results found. ? ?Procedures ?Procedures  ? ? ?Medications Ordered in ED ?Medications  ?sodium chloride 0.9 % bolus 1,000 mL (1,000 mLs Intravenous New Bag/Given 05/30/21 0956)  ?morphine (PF) 4 MG/ML injection 4 mg (4 mg Intravenous Given 05/30/21 0957)  ?ondansetron [facility]) injection 4 mg (4 mg Intravenous Given 05/30/21 0956)  ? ? ?ED Course/ Medical Decision Making/ A&P ?  ?                        ?Medical Decision Making ?Amount and/or Complexity of Data Reviewed ?Labs: ordered. ? ?Risk ?Prescription drug management. ? ? ?Cardiac monitor shows sinus rhythm. ? ?Review of record shows admission April 21, 2021 for abdominal pain. ? ?Work-up today shows mild elevation of lipase but much lower than prior admission.  Chemistry and CBC  otherwise normal. ? ?Patient's pain appears well controlled at this time.  Given IV fluid resuscitation.  Recommending clear liquid diet at home for 2 days with gradual advancement.  Patient was understanding, recommend follow-up with primary care doctor in 2 to 3 days on outpatient basis, advised immediate return for worsening pain inability keep down any fluids or any additional concerns. ? ? ?Final Clinical Impression(s) / ED Diagnoses ?Final diagnoses:  ?Acute pancreatitis,  unspecified complication status, unspecified pancreatitis type  ? ? ?Rx / DC Orders ?ED Discharge Orders   ? ?      Ordered  ?  oxyCODONE-acetaminophen (PERCOCET/ROXICET) 5-325 MG tablet  Every 6 hours PRN       ? 05/30/21 1233  ?  ondansetron (ZOFRAN-ODT) 4 MG disintegrating tablet  Every 8 hours PRN       ? 05/30/21 1233  ? ?  ?  ? ?  ? ? ?  ?Luna Fuse, MD ?05/30/21 1219 ? ?  ?Luna Fuse, MD ?05/30/21 1233 ? ?

## 2021-05-30 NOTE — ED Triage Notes (Signed)
Patient with complaints of generalized abdominal pain for 3 days with recent admission for pancreatitis. Denies N/V/D ?

## 2021-06-13 DIAGNOSIS — K859 Acute pancreatitis without necrosis or infection, unspecified: Secondary | ICD-10-CM | POA: Diagnosis not present

## 2021-06-13 DIAGNOSIS — Z681 Body mass index (BMI) 19 or less, adult: Secondary | ICD-10-CM | POA: Diagnosis not present

## 2021-06-18 DIAGNOSIS — K859 Acute pancreatitis without necrosis or infection, unspecified: Secondary | ICD-10-CM | POA: Diagnosis not present

## 2021-07-04 ENCOUNTER — Encounter: Payer: Self-pay | Admitting: Internal Medicine

## 2021-07-23 ENCOUNTER — Other Ambulatory Visit (HOSPITAL_COMMUNITY): Payer: Self-pay

## 2021-07-23 MED ORDER — DILTIAZEM HCL ER COATED BEADS 360 MG PO CP24
360.0000 mg | ORAL_CAPSULE | Freq: Every evening | ORAL | 3 refills | Status: DC
  Filled 2021-07-23: qty 90, 90d supply, fill #0
  Filled 2021-10-22: qty 90, 90d supply, fill #1
  Filled 2022-01-21: qty 90, 90d supply, fill #2
  Filled 2022-04-22: qty 90, 90d supply, fill #3

## 2021-07-23 MED ORDER — SIMVASTATIN 20 MG PO TABS
ORAL_TABLET | ORAL | 1 refills | Status: DC
  Filled 2021-07-23: qty 90, 90d supply, fill #0
  Filled 2021-10-22: qty 90, 90d supply, fill #1

## 2021-07-25 ENCOUNTER — Telehealth: Payer: Self-pay | Admitting: *Deleted

## 2021-07-25 ENCOUNTER — Ambulatory Visit (INDEPENDENT_AMBULATORY_CARE_PROVIDER_SITE_OTHER): Payer: 59 | Admitting: Internal Medicine

## 2021-07-25 ENCOUNTER — Encounter: Payer: Self-pay | Admitting: Internal Medicine

## 2021-07-25 ENCOUNTER — Encounter: Payer: Self-pay | Admitting: *Deleted

## 2021-07-25 VITALS — BP 134/72 | HR 73 | Temp 97.4°F | Ht 62.0 in | Wt 111.7 lb

## 2021-07-25 DIAGNOSIS — R1013 Epigastric pain: Secondary | ICD-10-CM | POA: Diagnosis not present

## 2021-07-25 DIAGNOSIS — G8929 Other chronic pain: Secondary | ICD-10-CM

## 2021-07-25 DIAGNOSIS — K860 Alcohol-induced chronic pancreatitis: Secondary | ICD-10-CM | POA: Diagnosis not present

## 2021-07-25 DIAGNOSIS — K5900 Constipation, unspecified: Secondary | ICD-10-CM | POA: Diagnosis not present

## 2021-07-25 DIAGNOSIS — K2980 Duodenitis without bleeding: Secondary | ICD-10-CM | POA: Diagnosis not present

## 2021-07-25 DIAGNOSIS — K760 Fatty (change of) liver, not elsewhere classified: Secondary | ICD-10-CM

## 2021-07-25 NOTE — H&P (View-Only) (Signed)
Primary Care Physician:  Sharilyn Sites, MD Primary Gastroenterologist:  Dr. Abbey Chatters  Chief Complaint  Patient presents with   Pancreatitis    Referred for pancreatitis. Went to ED on 05/30/21. Was having epigastric pain and Has not had any pain since starting nexium otc one daily on May 20th.     HPI:   Kyle Giles is a 61 y.o. male who presents to the clinic today by referral from his PCP Dr. Hilma Favors for evaluation.  Was admitted to [facility] March 2023 for acute pancreatitis.  Felt to be alcohol induced as patient was significantly drinking at least 6 beers plus hard liquor daily.  Improved over a few days with conservative measures and was discharged home.  He has been completely sober from alcohol since that time.  States he recovered well but then again started having epigastric pain.  States this was not near as severe as previous.  Went to the ER again in April was discharged home.  He started taking Nexium daily and states his symptoms have now improved.  Today he states he is feeling very well.  Does complain of mild constipation which is new for him.  No diarrhea.  No oily stools.  Did have abnormal LFTs during his hospitalization, CT imaging showed fatty liver.  These have steadily improved.  Viral hepatitis panel negative.  CT scan did mention duodenitis as well likely as a consequence from his pancreatitis.  No chronic NSAID use.  No previous EGD.  Colonoscopy 2021 by Dr. Arnoldo Morale for screening showed 1 hyperplastic polyp.  Past Medical History:  Diagnosis Date   Anxiety    DM type 2 (diabetes mellitus, type 2) (HCC)    GERD (gastroesophageal reflux disease)    Heart murmur    saw dr Willeen Cass 06-29-2019 echo done    Hypercholesterolemia    Hypertension    Palpitations    none in a long time as of 10-19-2019   Pancreatitis    S/P colonoscopy 09/2009   Dr. Arnoldo Morale: Moderate diverticulosis throughout colon, otherwise normal    Umbilical hernia     Past  Surgical History:  Procedure Laterality Date   COLONOSCOPY N/A 12/21/2019   Procedure: COLONOSCOPY;  Surgeon: Aviva Signs, MD;  Location: [facility];  Service: Gastroenterology;  Laterality: N/A;   cyst removed from finger  age 51   [place]     as baby   POLYPECTOMY  12/21/2019   Procedure: POLYPECTOMY;  Surgeon: Aviva Signs, MD;  Location: [facility];  Service: Gastroenterology;;   pyloric stenosis repair     as baby, operated at [place] 10/29/2019   Procedure: OPEN UMBILICAL [place];  Surgeon: Clovis Riley, MD;  Location: [facility];  Service: General;  Laterality: N/A;    Current Outpatient Medications  Medication Sig Dispense Refill   ALPRAZolam (XANAX) 1 MG tablet Take 1 tablet by mouth three times daily. 270 tablet 2   cholecalciferol (VITAMIN D3) 25 MCG (1000 UNIT) tablet Take 1,000 Units by mouth daily.     diltiazem (CARDIZEM CD) 360 MG 24 hr capsule Take 1 capsule (360 mg total) by mouth every evening. 90 capsule 3   Esomeprazole Magnesium (NEXIUM PO) Take by mouth. OTC one at lunch     fenofibrate 160 MG tablet Take 1 tablet by mouth daily 90 tablet 2   metFORMIN (GLUCOPHAGE) 500 MG tablet Take 250 mg by mouth 2 (two)  times daily with a meal.     olmesartan-hydrochlorothiazide (BENICAR HCT) 40-25 MG tablet Take 1 tablet by mouth once a day 90 tablet 1   simvastatin (ZOCOR) 20 MG tablet TAKE 1 TABLET BY MOUTH ONCE DAILY. 90 tablet 1   No current facility-administered medications for this visit.    Allergies as of 07/25/2021   (No Known Allergies)    Family History  Problem Relation Age of Onset   Colon cancer Neg Hx     Social History   Socioeconomic History   Marital status: Married    Spouse name: Not on file   Number of children: 2   Years of education: Not on file   Highest education level: Not on file  Occupational History   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[employer]    Comment: [employer]  Tobacco Use   Smoking status: Every Day    Packs/day: 1.50    Years: 30.00    Total pack years: 45.00    Types: Cigarettes    Passive exposure: Current   Smokeless tobacco: Never   Tobacco comments:    1 ppd since 3 weeks as of 10-19-2019  Vaping Use   Vaping Use: Never used  Substance and Sexual Activity   Alcohol use: Not Currently    Comment: quit 05/30/21   Drug use: Not Currently   Sexual activity: Not on file  Other Topics Concern   Not on file  Social History Narrative   Not on file   Social Determinants of Health   Financial Resource Strain: Not on file  Food Insecurity: Not on file  Transportation Needs: Not on file  Physical Activity: Not on file  Stress: Not on file  Social Connections: Not on file  Intimate Partner Violence: Not on file    Subjective: Review of Systems  Constitutional:  Negative for chills and fever.  HENT:  Negative for congestion and hearing loss.   Eyes:  Negative for blurred vision and double vision.  Respiratory:  Negative for cough and shortness of breath.   Cardiovascular:  Negative for chest pain and palpitations.  Gastrointestinal:  Positive for abdominal pain and constipation. Negative for blood in stool, diarrhea, melena and vomiting.  Genitourinary:  Negative for dysuria and urgency.  Musculoskeletal:  Negative for joint pain and myalgias.  Skin:  Negative for itching and rash.  Neurological:  Negative for dizziness and headaches.  Psychiatric/Behavioral:  Negative for depression. The patient is not nervous/anxious.        Objective: BP 134/72 (BP Location: Left Arm, Patient Position: Sitting, Cuff Size: Normal)   Pulse 73   Temp (!) 97.4 F (36.3 C) (Oral)   Ht 5' 2"  (1.575 m)   Wt 111 lb 11.2 oz (50.7 kg)   BMI 20.43 kg/m  Physical Exam Constitutional:      Appearance: Normal appearance.  HENT:     Head: Normocephalic and atraumatic.  Eyes:     Extraocular Movements:  Extraocular movements intact.     Conjunctiva/sclera: Conjunctivae normal.  Cardiovascular:     Rate and Rhythm: Normal rate and regular rhythm.  Pulmonary:     Effort: Pulmonary effort is normal.     Breath sounds: Normal breath sounds.  Abdominal:     General: Bowel sounds are normal.     Palpations: Abdomen is soft.  Musculoskeletal:        General: Normal range of motion.     Cervical back: Normal range of  motion and neck supple.  Skin:    General: Skin is warm.  Neurological:     General: No focal deficit present.     Mental Status: He is alert and oriented to person, place, and time.  Psychiatric:        Mood and Affect: Mood normal.        Behavior: Behavior normal.      Assessment/plan:  1.  Chronic pancreatitis-likely alcohol induced with recent acute pancreatitis.  Appears to have improved from hospitalization today.  Has been sober completely from alcohol since March 2023.  Congratulated him on this.  Pain improved.  No diarrhea.  Consider pancreatic enzyme replacement in the future if symptoms arise.  2.  Epigastric pain-chronic, improved after Nexium.  Given abnormal CT showing duodenitis, will schedule for EGD to further evaluate. The risks including infection, bleed, or perforation as well as benefits, limitations, alternatives and imponderables have been reviewed with the patient. Potential for esophageal dilation, biopsy, etc. have also been reviewed.  Questions have been answered. All parties agreeable.  3.  Constipation-mild,  I recommended taking MiraLAX 1 capful daily.  If this is not adequate then increase to 2 capfuls daily.  If this is still not adequate then add on once daily Dulcolax. Encouraged to drink at least 6 glasses of water daily.  4.  Steatohepatitis-likely alcohol induced.  No evidence of cirrhosis.  Continue to avoid alcohol.  Recheck LFTs today.  Thank you Dr. Hilma Favors for the kind referral.  07/25/2021 8:41 AM   Disclaimer: This note was  dictated with voice recognition software. Similar sounding words can inadvertently be transcribed and may not be corrected upon review.

## 2021-07-25 NOTE — Telephone Encounter (Signed)
Called pt. Scheduled for egd with Dr. Abbey Chatters, ASA 3 on 7/10 at 1:30pm. Aware will send instructions/pre-op to mychart and will also mail.  PA submitted via [employer]. Case ID# [id number]

## 2021-07-25 NOTE — Patient Instructions (Signed)
We will schedule you for upper endoscopy to further evaluate your abnormal CT scan as well as your chronic epigastric abdominal pain.  Continue on Nexium daily.  I want to check blood work at [facility] today to recheck your liver tests.  For your constipation, I want you to start taking over the counter MiraLAX 1 capful daily.  If this does not adequately control your constipation, I would increase to 2 capfuls daily.  If this is still not adequate, then I would add on once daily Dulcolax (bisacodyl) tablet.  Be sure to drink at least 4 to 6 glasses of water daily.   Congratulations on your sobriety.  Continue to avoid alcohol.  It was very nice meeting you today.  Dr. Abbey Chatters  At [facility] we value your feedback. You may receive a survey about your visit today. Please share your experience as we strive to create trusting relationships with our patients to provide genuine, compassionate, quality care.  We appreciate your understanding and patience as we review any laboratory studies, imaging, and other diagnostic tests that are ordered as we care for you. Our office policy is 5 business days for review of these results, and any emergent or urgent results are addressed in a timely manner for your best interest. If you do not hear from our office in 1 week, please contact us.   We also encourage the use of MyChart, which contains your medical information for your review as well. If you are not enrolled in this feature, an access code is on this after visit summary for your convenience. Thank you for allowing [place] to be involved in your care.  It was great to see you today!  I hope you have a great rest of your Spring!    Kyle Giles. Abbey Chatters, D.O. Gastroenterology and Hepatology [facility]

## 2021-07-25 NOTE — Progress Notes (Signed)
Primary Care Physician:  Sharilyn Sites, MD Primary Gastroenterologist:  Dr. Abbey Chatters  Chief Complaint  Patient presents with   Pancreatitis    Referred for pancreatitis. Went to ED on 05/30/21. Was having epigastric pain and Has not had any pain since starting nexium otc one daily on May 20th.     HPI:   Kyle Giles is a 61 y.o. male who presents to the clinic today by referral from his PCP Dr. Hilma Favors for evaluation.  Was admitted to [facility] March 2023 for acute pancreatitis.  Felt to be alcohol induced as patient was significantly drinking at least 6 beers plus hard liquor daily.  Improved over a few days with conservative measures and was discharged home.  He has been completely sober from alcohol since that time.  States he recovered well but then again started having epigastric pain.  States this was not near as severe as previous.  Went to the ER again in April was discharged home.  He started taking Nexium daily and states his symptoms have now improved.  Today he states he is feeling very well.  Does complain of mild constipation which is new for him.  No diarrhea.  No oily stools.  Did have abnormal LFTs during his hospitalization, CT imaging showed fatty liver.  These have steadily improved.  Viral hepatitis panel negative.  CT scan did mention duodenitis as well likely as a consequence from his pancreatitis.  No chronic NSAID use.  No previous EGD.  Colonoscopy 2021 by Dr. Arnoldo Morale for screening showed 1 hyperplastic polyp.  Past Medical History:  Diagnosis Date   Anxiety    DM type 2 (diabetes mellitus, type 2) (HCC)    GERD (gastroesophageal reflux disease)    Heart murmur    saw dr Willeen Cass 06-29-2019 echo done    Hypercholesterolemia    Hypertension    Palpitations    none in a long time as of 10-19-2019   Pancreatitis    S/P colonoscopy 09/2009   Dr. Arnoldo Morale: Moderate diverticulosis throughout colon, otherwise normal    Umbilical hernia     Past  Surgical History:  Procedure Laterality Date   COLONOSCOPY N/A 12/21/2019   Procedure: COLONOSCOPY;  Surgeon: Aviva Signs, MD;  [facility]: [facility];  Service: Gastroenterology;  Laterality: N/A;   cyst removed from finger  age 51   [place]     as baby   POLYPECTOMY  12/21/2019   Procedure: POLYPECTOMY;  Surgeon: Aviva Signs, MD;  Location: [facility];  Service: Gastroenterology;;   pyloric stenosis repair     as baby, operated at [place] 10/29/2019   Procedure: OPEN UMBILICAL [place];  Surgeon: Clovis Riley, MD;  Location: [place];  Service: General;  Laterality: N/A;    Current Outpatient Medications  Medication Sig Dispense Refill   ALPRAZolam (XANAX) 1 MG tablet Take 1 tablet by mouth three times daily. 270 tablet 2   cholecalciferol (VITAMIN D3) 25 MCG (1000 UNIT) tablet Take 1,000 Units by mouth daily.     diltiazem (CARDIZEM CD) 360 MG 24 hr capsule Take 1 capsule (360 mg total) by mouth every evening. 90 capsule 3   Esomeprazole Magnesium (NEXIUM PO) Take by mouth. OTC one at lunch     fenofibrate 160 MG tablet Take 1 tablet by mouth daily 90 tablet 2   metFORMIN (GLUCOPHAGE) 500 MG tablet Take 250 mg by mouth 2 (two)  times daily with a meal.     olmesartan-hydrochlorothiazide (BENICAR HCT) 40-25 MG tablet Take 1 tablet by mouth once a day 90 tablet 1   simvastatin (ZOCOR) 20 MG tablet TAKE 1 TABLET BY MOUTH ONCE DAILY. 90 tablet 1   No current facility-administered medications for this visit.    Allergies as of 07/25/2021   (No Known Allergies)    Family History  Problem Relation Age of Onset   Colon cancer Neg Hx     Social History   Socioeconomic History   Marital status: Married    Spouse name: Not on file   Number of children: 2   Years of education: Not on file   Highest education level: Not on file  Occupational History   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[employer]    Comment: [employer]  Tobacco Use   Smoking status: Every Day    Packs/day: 1.50    Years: 30.00    Total pack years: 45.00    Types: Cigarettes    Passive exposure: Current   Smokeless tobacco: Never   Tobacco comments:    1 ppd since 3 weeks as of 10-19-2019  Vaping Use   Vaping Use: Never used  Substance and Sexual Activity   Alcohol use: Not Currently    Comment: quit 05/30/21   Drug use: Not Currently   Sexual activity: Not on file  Other Topics Concern   Not on file  Social History Narrative   Not on file   Social Determinants of Health   Financial Resource Strain: Not on file  Food Insecurity: Not on file  Transportation Needs: Not on file  Physical Activity: Not on file  Stress: Not on file  Social Connections: Not on file  Intimate Partner Violence: Not on file    Subjective: Review of Systems  Constitutional:  Negative for chills and fever.  HENT:  Negative for congestion and hearing loss.   Eyes:  Negative for blurred vision and double vision.  Respiratory:  Negative for cough and shortness of breath.   Cardiovascular:  Negative for chest pain and palpitations.  Gastrointestinal:  Positive for abdominal pain and constipation. Negative for blood in stool, diarrhea, melena and vomiting.  Genitourinary:  Negative for dysuria and urgency.  Musculoskeletal:  Negative for joint pain and myalgias.  Skin:  Negative for itching and rash.  Neurological:  Negative for dizziness and headaches.  Psychiatric/Behavioral:  Negative for depression. The patient is not nervous/anxious.        Objective: BP 134/72 (BP Location: Left Arm, Patient Position: Sitting, Cuff Size: Normal)   Pulse 73   Temp (!) 97.4 F (36.3 C) (Oral)   Ht 5' 2"  (1.575 m)   Wt 111 lb 11.2 oz (50.7 kg)   BMI 20.43 kg/m  Physical Exam Constitutional:      Appearance: Normal appearance.  HENT:     Head: Normocephalic and atraumatic.  Eyes:     Extraocular Movements:  Extraocular movements intact.     Conjunctiva/sclera: Conjunctivae normal.  Cardiovascular:     Rate and Rhythm: Normal rate and regular rhythm.  Pulmonary:     Effort: Pulmonary effort is normal.     Breath sounds: Normal breath sounds.  Abdominal:     General: Bowel sounds are normal.     Palpations: Abdomen is soft.  Musculoskeletal:        General: Normal range of motion.     Cervical back: Normal range of  motion and neck supple.  Skin:    General: Skin is warm.  Neurological:     General: No focal deficit present.     Mental Status: He is alert and oriented to person, place, and time.  Psychiatric:        Mood and Affect: Mood normal.        Behavior: Behavior normal.      Assessment/plan:  1.  Chronic pancreatitis-likely alcohol induced with recent acute pancreatitis.  Appears to have improved from hospitalization today.  Has been sober completely from alcohol since March 2023.  Congratulated him on this.  Pain improved.  No diarrhea.  Consider pancreatic enzyme replacement in the future if symptoms arise.  2.  Epigastric pain-chronic, improved after Nexium.  Given abnormal CT showing duodenitis, will schedule for EGD to further evaluate. The risks including infection, bleed, or perforation as well as benefits, limitations, alternatives and imponderables have been reviewed with the patient. Potential for esophageal dilation, biopsy, etc. have also been reviewed.  Questions have been answered. All parties agreeable.  3.  Constipation-mild,  I recommended taking MiraLAX 1 capful daily.  If this is not adequate then increase to 2 capfuls daily.  If this is still not adequate then add on once daily Dulcolax. Encouraged to drink at least 6 glasses of water daily.  4.  Steatohepatitis-likely alcohol induced.  No evidence of cirrhosis.  Continue to avoid alcohol.  Recheck LFTs today.  Thank you Dr. Hilma Favors for the kind referral.  07/25/2021 8:41 AM   Disclaimer: This note was  dictated with voice recognition software. Similar sounding words can inadvertently be transcribed and may not be corrected upon review.

## 2021-07-26 LAB — COMPLETE METABOLIC PANEL WITH GFR
AG Ratio: 2 (calc) (ref 1.0–2.5)
ALT: 15 U/L (ref 9–46)
AST: 16 U/L (ref 10–35)
Albumin: 4.4 g/dL (ref 3.6–5.1)
Alkaline phosphatase (APISO): 32 U/L — ABNORMAL LOW (ref 35–144)
BUN: 22 mg/dL (ref 7–25)
CO2: 21 mmol/L (ref 20–32)
Calcium: 9.9 mg/dL (ref 8.6–10.3)
Chloride: 107 mmol/L (ref 98–110)
Creat: 1.16 mg/dL (ref 0.70–1.35)
Globulin: 2.2 g/dL (calc) (ref 1.9–3.7)
Glucose, Bld: 116 mg/dL (ref 65–139)
Potassium: 4.1 mmol/L (ref 3.5–5.3)
Sodium: 139 mmol/L (ref 135–146)
Total Bilirubin: 0.3 mg/dL (ref 0.2–1.2)
Total Protein: 6.6 g/dL (ref 6.1–8.1)
eGFR: 71 mL/min/{1.73_m2} (ref >=60)

## 2021-07-26 NOTE — Telephone Encounter (Signed)
No Auth Required

## 2021-08-03 NOTE — Patient Instructions (Signed)
20    Your procedure is scheduled on: 08/13/2021  Report to [facility] Entrance at     12:00 PM.  Call this number if you have problems the morning of surgery: [phone]   Remember:   Follow instructions on letter from office regarding when to stop eating and drinking        No Smoking the day of procedure      Take these medicines the morning of surgery with A SIP OF WATER: Diltiazem and nexium  No diabetic medication am of procedure   Do not wear jewelry, make-up or nail polish.  Do not wear lotions, powders, or perfumes. You may wear deodorant.                Do not bring valuables to the hospital.  Contacts, dentures or bridgework may not be worn into surgery.  Leave suitcase in the car. After surgery it may be brought to your room.  For patients admitted to the hospital, checkout time is 11:00 AM the day of discharge.   Patients discharged the day of surgery will not be allowed to drive home. Upper Endoscopy, Adult Upper endoscopy is a procedure to look inside the upper GI (gastrointestinal) tract. The upper GI tract is made up of: The part of the body that moves food from your mouth to your stomach (esophagus). The stomach. The first part of your small intestine (duodenum). This procedure is also called esophagogastroduodenoscopy (EGD) or gastroscopy. In this procedure, your health care provider passes a thin, flexible tube (endoscope) through your mouth and down your esophagus into your stomach. A small camera is attached to the end of the tube. Images from the camera appear on a monitor in the exam room. During this procedure, your health care provider may also remove a small piece of tissue to be sent to a lab and examined under a microscope (biopsy). Your health care provider may do an upper endoscopy to diagnose cancers of the upper GI tract. You may also have this procedure to find the cause of other conditions, such as: Stomach pain. Heartburn. Pain or problems when  swallowing. Nausea and vomiting. Stomach bleeding. Stomach ulcers. Tell a health care provider about: Any allergies you have. All medicines you are taking, including vitamins, herbs, eye drops, creams, and over-the-counter medicines. Any problems you or family members have had with anesthetic medicines. Any blood disorders you have. Any surgeries you have had. Any medical conditions you have. Whether you are pregnant or may be pregnant. What are the risks? Generally, this is a safe procedure. However, problems may occur, including: Infection. Bleeding. Allergic reactions to medicines. A tear or hole (perforation) in the esophagus, stomach, or duodenum. What happens before the procedure? Staying hydrated Follow instructions from your health care provider about hydration, which may include: Up to 4 hours before the procedure - you may continue to drink clear liquids, such as water, clear fruit juice, black coffee, and plain tea.   Medicines Ask your health care provider about: Changing or stopping your regular medicines. This is especially important if you are taking diabetes medicines or blood thinners. Taking medicines such as aspirin and ibuprofen. These medicines can thin your blood. Do not take these medicines unless your health care provider tells you to take them. Taking over-the-counter medicines, vitamins, herbs, and supplements. General instructions Plan to have someone take you home from the hospital or clinic. If you will be going home right after the procedure, plan to have  someone with you for 24 hours. Ask your health care provider what steps will be taken to help prevent infection. What happens during the procedure?  An IV will be inserted into one of your veins. You may be given one or more of the following: A medicine to help you relax (sedative). A medicine to numb the throat (local anesthetic). You will lie on your left side on an exam table. Your health care  provider will pass the endoscope through your mouth and down your esophagus. Your health care provider will use the scope to check the inside of your esophagus, stomach, and duodenum. Biopsies may be taken. The endoscope will be removed. The procedure may vary among health care providers and hospitals. What happens after the procedure? Your blood pressure, heart rate, breathing rate, and blood oxygen level will be monitored until you leave the hospital or clinic. Do not drive for 24 hours if you were given a sedative during your procedure. When your throat is no longer numb, you may be given some fluids to drink. It is up to you to get the results of your procedure. Ask your health care provider, or the department that is doing the procedure, when your results will be ready. Summary Upper endoscopy is a procedure to look inside the upper GI tract. During the procedure, an IV will be inserted into one of your veins. You may be given a medicine to help you relax. A medicine will be used to numb your throat. The endoscope will be passed through your mouth and down your esophagus. This information is not intended to replace advice given to you by your health care provider. Make sure you discuss any questions you have with your health care provider. Document Revised: 07/16/2017 Document Reviewed: 06/23/2017 Elsevier Patient Education  [place] After  Please read the instructions outlined below and refer to this sheet in the next few weeks. These discharge instructions provide you with general information on caring for yourself after you leave the hospital. Your doctor may also give you specific instructions. While your treatment has been planned according to the most current medical practices available, unavoidable complications occasionally  occur. If you have any problems or questions after discharge, please call your doctor. HOME CARE INSTRUCTIONS Activity You may resume your regular activity but move at a slower pace for the next 24 hours.  Take frequent rest periods for the next 24 hours.  Walking will help expel (get rid of) the air and reduce the bloated feeling in your abdomen.  No driving for 24 hours (because of the anesthesia (medicine) used during the test).  You may shower.  Do not sign any important legal documents or operate any machinery  for 24 hours (because of the anesthesia used during the test).  Nutrition Drink plenty of fluids.  You may resume your normal diet.  Begin with a light meal and progress to your normal diet.  Avoid alcoholic beverages for 24 hours or as instructed by your caregiver.  Medications You may resume your normal medications unless your caregiver tells you otherwise. What you can expect today You may experience abdominal discomfort such as a feeling of fullness or "gas" pains.  You may experience a sore throat for 2 to 3 days. This is normal. Gargling with salt water may help this.  Follow-up Your doctor will discuss the results of your test with you. SEEK IMMEDIATE MEDICAL CARE IF: You have excessive nausea (feeling sick to your stomach) and/or vomiting.  You have severe abdominal pain and distention (swelling).  You have trouble swallowing.  You have a temperature over 100 F (37.8 C).  You have rectal bleeding or vomiting of blood.  Document Released: 09/05/2003 Document Revised: 01/10/2011 Document Reviewed: 03/18/2007

## 2021-08-08 ENCOUNTER — Encounter (HOSPITAL_COMMUNITY)
Admission: RE | Admit: 2021-08-08 | Discharge: 2021-08-08 | Disposition: A | Discharge status: Home / Self Care | Payer: 59 | Source: Ambulatory Visit | Attending: Internal Medicine | Admitting: Internal Medicine

## 2021-08-09 ENCOUNTER — Other Ambulatory Visit (HOSPITAL_COMMUNITY): Payer: Self-pay

## 2021-08-09 MED ORDER — CETIRIZINE HCL 10 MG PO TABS
ORAL_TABLET | ORAL | 3 refills | Status: DC
  Filled 2021-08-09: qty 90, 90d supply, fill #0
  Filled 2021-10-29: qty 90, 90d supply, fill #1

## 2021-08-09 MED ORDER — OLMESARTAN MEDOXOMIL-HCTZ 40-25 MG PO TABS
ORAL_TABLET | ORAL | 1 refills | Status: DC
  Filled 2021-08-09: qty 90, 90d supply, fill #0
  Filled 2021-11-19: qty 90, 90d supply, fill #1

## 2021-08-13 ENCOUNTER — Ambulatory Visit (HOSPITAL_COMMUNITY): Payer: 59 | Admitting: Anesthesiology

## 2021-08-13 ENCOUNTER — Encounter (HOSPITAL_COMMUNITY)
Admission: RE | Disposition: A | Discharge status: Home / Self Care | Payer: Self-pay | Source: Home / Self Care | Attending: Internal Medicine

## 2021-08-13 ENCOUNTER — Other Ambulatory Visit: Payer: Self-pay

## 2021-08-13 ENCOUNTER — Ambulatory Visit (HOSPITAL_BASED_OUTPATIENT_CLINIC_OR_DEPARTMENT_OTHER): Payer: 59 | Admitting: Anesthesiology

## 2021-08-13 ENCOUNTER — Encounter (HOSPITAL_COMMUNITY): Payer: Self-pay

## 2021-08-13 ENCOUNTER — Ambulatory Visit (HOSPITAL_COMMUNITY)
Admission: RE | Admit: 2021-08-13 | Discharge: 2021-08-13 | Disposition: A | Discharge status: Home / Self Care | Payer: 59 | Attending: Internal Medicine | Admitting: Internal Medicine

## 2021-08-13 DIAGNOSIS — F419 Anxiety disorder, unspecified: Secondary | ICD-10-CM | POA: Diagnosis not present

## 2021-08-13 DIAGNOSIS — K7581 Nonalcoholic steatohepatitis (NASH): Secondary | ICD-10-CM | POA: Diagnosis not present

## 2021-08-13 DIAGNOSIS — K219 Gastro-esophageal reflux disease without esophagitis: Secondary | ICD-10-CM | POA: Diagnosis not present

## 2021-08-13 DIAGNOSIS — R933 Abnormal findings on diagnostic imaging of other parts of digestive tract: Secondary | ICD-10-CM

## 2021-08-13 DIAGNOSIS — R1013 Epigastric pain: Secondary | ICD-10-CM | POA: Diagnosis not present

## 2021-08-13 DIAGNOSIS — F1010 Alcohol abuse, uncomplicated: Secondary | ICD-10-CM

## 2021-08-13 DIAGNOSIS — K861 Other chronic pancreatitis: Secondary | ICD-10-CM | POA: Insufficient documentation

## 2021-08-13 DIAGNOSIS — Z7984 Long term (current) use of oral hypoglycemic drugs: Secondary | ICD-10-CM | POA: Diagnosis not present

## 2021-08-13 DIAGNOSIS — I10 Essential (primary) hypertension: Secondary | ICD-10-CM | POA: Insufficient documentation

## 2021-08-13 DIAGNOSIS — K5900 Constipation, unspecified: Secondary | ICD-10-CM | POA: Insufficient documentation

## 2021-08-13 DIAGNOSIS — E119 Type 2 diabetes mellitus without complications: Secondary | ICD-10-CM | POA: Insufficient documentation

## 2021-08-13 DIAGNOSIS — K859 Acute pancreatitis without necrosis or infection, unspecified: Secondary | ICD-10-CM

## 2021-08-13 DIAGNOSIS — Z1211 Encounter for screening for malignant neoplasm of colon: Secondary | ICD-10-CM

## 2021-08-13 DIAGNOSIS — G8929 Other chronic pain: Secondary | ICD-10-CM | POA: Diagnosis not present

## 2021-08-13 DIAGNOSIS — K5730 Diverticulosis of large intestine without perforation or abscess without bleeding: Secondary | ICD-10-CM

## 2021-08-13 DIAGNOSIS — K760 Fatty (change of) liver, not elsewhere classified: Secondary | ICD-10-CM

## 2021-08-13 DIAGNOSIS — F17210 Nicotine dependence, cigarettes, uncomplicated: Secondary | ICD-10-CM | POA: Diagnosis not present

## 2021-08-13 DIAGNOSIS — Z720 Tobacco use: Secondary | ICD-10-CM

## 2021-08-13 DIAGNOSIS — K621 Rectal polyp: Secondary | ICD-10-CM

## 2021-08-13 HISTORY — PX: ESOPHAGOGASTRODUODENOSCOPY (EGD) WITH PROPOFOL: SHX5813

## 2021-08-13 LAB — GLUCOSE, CAPILLARY: Glucose-Capillary: 119 mg/dL — ABNORMAL HIGH (ref 70–99)

## 2021-08-13 SURGERY — ESOPHAGOGASTRODUODENOSCOPY (EGD) WITH PROPOFOL
Anesthesia: General

## 2021-08-13 MED ORDER — PROPOFOL 10 MG/ML IV BOLUS
INTRAVENOUS | Status: DC | PRN
  Administered 2021-08-13: 60 mg via INTRAVENOUS
  Administered 2021-08-13: 50 mg via INTRAVENOUS

## 2021-08-13 MED ORDER — LACTATED RINGERS IV SOLN: INTRAVENOUS | Status: DC

## 2021-08-13 MED ORDER — LIDOCAINE 2% (20 MG/ML) 5 ML SYRINGE
INTRAMUSCULAR | Status: DC | PRN
  Administered 2021-08-13: 50 mg via INTRAVENOUS

## 2021-08-13 NOTE — Interval H&P Note (Signed)
History and Physical Interval Note:  08/13/2021 11:57 AM  Kyle Giles  has presented today for surgery, with the diagnosis of epigastric pain, abnormal CT.  The various methods of treatment have been discussed with the patient and family. After consideration of risks, benefits and other options for treatment, the patient has consented to  Procedure(s) with comments: ESOPHAGOGASTRODUODENOSCOPY (EGD) WITH PROPOFOL (N/A) - 1:30pm as a surgical intervention.  The patient's history has been reviewed, patient examined, no change in status, stable for surgery.  I have reviewed the patient's chart and labs.  Questions were answered to the patient's satisfaction.     Eloise Harman

## 2021-08-13 NOTE — Op Note (Signed)
[facility] Patient Name: Kyle Giles Procedure Date: 08/13/2021 11:28 AM MRN: [id number] Date of Birth: [birth year] Attending MD: Elon Alas. Abbey Chatters DO CSN: [id number] Age: 61 Admit Type: Outpatient Procedure:                Upper GI endoscopy Indications:              Epigastric abdominal pain, Abnormal CT of the GI                            tract Providers:                Elon Alas. Abbey Chatters, DO, Janeece Riggers, RN, Crystal                            Page, Randa Spike, Technician Referring MD:              Medicines:                See the Anesthesia note for documentation of the                            administered medications Complications:            No immediate complications. Estimated Blood Loss:     Estimated blood loss: none. Procedure:                Pre-Anesthesia Assessment:                           - The anesthesia plan was to use monitored                            anesthesia care (MAC).                           After obtaining informed consent, the endoscope was                            passed under direct vision. Throughout the                            procedure, the patient's blood pressure, pulse, and                            oxygen saturations were monitored continuously. The                            [id number]-H190 ([id number]) scope was introduced through the                            mouth, and advanced to the second part of duodenum.                            The upper GI endoscopy was accomplished without                            difficulty. The patient tolerated  the procedure                            well. Scope In: 12:13:05 PM Scope Out: 12:16:01 PM Total Procedure Duration: 0 hours 2 minutes 56 seconds  Findings:      The Z-line was regular and was found 36 cm from the incisors.      There is no endoscopic evidence of areas of erosion, esophagitis, hiatal       hernia, ulcerations or varices in the entire esophagus.      The entire  examined stomach was normal.      The duodenal bulb, first portion of the duodenum and second portion of       the duodenum were normal. Impression:               - Z-line regular, 36 cm from the incisors.                           - Normal stomach.                           - Normal duodenal bulb, first portion of the                            duodenum and second portion of the duodenum.                           - No specimens collected. Moderate Sedation:      Per Anesthesia Care Recommendation:           - Patient has a contact number available for                            emergencies. The signs and symptoms of potential                            delayed complications were discussed with the                            patient. Return to normal activities tomorrow.                            Written discharge instructions were provided to the                            patient.                           - Resume previous diet.                           - Continue present medications.                           - Use Nexium (esomeprazole) 20 mg PO daily.                           - Return to GI  clinic in 6 months. Procedure Code(s):        --- Professional ---                           [phone], Esophagogastroduodenoscopy, flexible,                            transoral; diagnostic, including collection of                            specimen(s) by brushing or washing, when performed                            (separate procedure) Diagnosis Code(s):        --- Professional ---                           R10.13, Epigastric pain                           R93.3, Abnormal findings on diagnostic imaging of                            other parts of digestive tract CPT copyright 2019 American Medical Association. All rights reserved. The codes documented in this report are preliminary and upon coder review may  be revised to meet current compliance requirements. Elon Alas. Abbey Chatters, DO [place] Abbey Chatters, DO 08/13/2021 12:18:10 PM This report has been signed electronically. Number of Addenda: 0

## 2021-08-13 NOTE — Anesthesia Postprocedure Evaluation (Signed)
Anesthesia Post Note  Patient: Kyle Giles  Procedure(s) Performed: ESOPHAGOGASTRODUODENOSCOPY (EGD) WITH PROPOFOL  Patient location during evaluation: PACU Anesthesia Type: General Level of consciousness: awake and alert Pain management: pain level controlled Vital Signs Assessment: post-procedure vital signs reviewed and stable Respiratory status: spontaneous breathing, nonlabored ventilation, respiratory function stable and patient connected to nasal cannula oxygen Cardiovascular status: blood pressure returned to baseline and stable Postop Assessment: no apparent nausea or vomiting Anesthetic complications: no   There were no known notable events for this encounter.   Last Vitals:  Vitals:   08/13/21 1219 08/13/21 1227  BP: (!) 92/40 (!) 93/47  Pulse: 60 (!) 57  Resp: 18   Temp: (!) 36.4 C   SpO2: 94% 96%    Last Pain:  Vitals:   08/13/21 1227  TempSrc:   PainSc: 0-No pain                 Trixie Rude

## 2021-08-13 NOTE — Anesthesia Preprocedure Evaluation (Signed)
Anesthesia Evaluation  Patient identified by MRN, date of birth, ID band Patient awake    Reviewed: Allergy & Precautions, NPO status , Patient's Chart, lab work & pertinent test results, reviewed documented beta blocker date and time   Airway Mallampati: II  TM Distance: >3 FB Neck ROM: Full    Dental  (+) Teeth Intact, Dental Advisory Given   Pulmonary Current Smoker and Patient abstained from smoking.,    breath sounds clear to auscultation       Cardiovascular hypertension,  Rhythm:Regular Rate:Normal     Neuro/Psych Anxiety    GI/Hepatic GERD  ,  Endo/Other  diabetes  Renal/GU      Musculoskeletal   Abdominal   Peds  Hematology   Anesthesia Other Findings   Reproductive/Obstetrics                             Anesthesia Physical Anesthesia Plan  ASA: 2  Anesthesia Plan: General   Post-op Pain Management:    Induction: Intravenous  PONV Risk Score and Plan: 1 and Propofol infusion  Airway Management Planned: Nasal Cannula  Additional Equipment:   Intra-op Plan:   Post-operative Plan:   Informed Consent: I have reviewed the patients History and Physical, chart, labs and discussed the procedure including the risks, benefits and alternatives for the proposed anesthesia with the patient or authorized representative who has indicated his/her understanding and acceptance.     Dental advisory given  Plan Discussed with: Anesthesiologist and Surgeon  Anesthesia Plan Comments:         Anesthesia Quick Evaluation

## 2021-08-13 NOTE — Discharge Instructions (Addendum)
EGD Discharge instructions Please read the instructions outlined below and refer to this sheet in the next few weeks. These discharge instructions provide you with general information on caring for yourself after you leave the hospital. Your doctor may also give you specific instructions. While your treatment has been planned according to the most current medical practices available, unavoidable complications occasionally occur. If you have any problems or questions after discharge, please call your doctor. ACTIVITY You may resume your regular activity but move at a slower pace for the next 24 hours.  Take frequent rest periods for the next 24 hours.  Walking will help expel (get rid of) the air and reduce the bloated feeling in your abdomen.  No driving for 24 hours (because of the anesthesia (medicine) used during the test).  You may shower.  Do not sign any important legal documents or operate any machinery for 24 hours (because of the anesthesia used during the test).  NUTRITION Drink plenty of fluids.  You may resume your normal diet.  Begin with a light meal and progress to your normal diet.  Avoid alcoholic beverages for 24 hours or as instructed by your caregiver.  MEDICATIONS You may resume your normal medications unless your caregiver tells you otherwise.  WHAT YOU CAN EXPECT TODAY You may experience abdominal discomfort such as a feeling of fullness or "gas" pains.  FOLLOW-UP Your doctor will discuss the results of your test with you.  SEEK IMMEDIATE MEDICAL ATTENTION IF ANY OF THE FOLLOWING OCCUR: Excessive nausea (feeling sick to your stomach) and/or vomiting.  Severe abdominal pain and distention (swelling).  Trouble swallowing.  Temperature over 101 F (37.8 C).  Rectal bleeding or vomiting of blood.    Your upper endoscopy was unremarkable.  Your esophagus stomach and small bowel all looked healthy.  I did not see any inflammation anywhere.  Continue alcohol cessation.   Continue on Nexium daily.  Follow-up with Dr. Abbey Chatters in 6 months.  LEFT MESSAGE AT THE OFFICE VOICEMAIL FOR FOLLOW UP APPOINTMENT IN 6 MONTHS   I hope you have a great rest of your week!  Elon Alas. Abbey Chatters, D.O. Gastroenterology and Hepatology [facility]\

## 2021-08-13 NOTE — Transfer of Care (Signed)
Immediate Anesthesia Transfer of Care Note  Patient: Kyle Giles  Procedure(s) Performed: ESOPHAGOGASTRODUODENOSCOPY (EGD) WITH PROPOFOL  Patient Location: Short Stay  Anesthesia Type:MAC  Level of Consciousness: sedated and patient cooperative  Airway & Oxygen Therapy: Patient Spontanous Breathing  Post-op Assessment: Report given to RN, Post -op Vital signs reviewed and stable and Patient moving all extremities  Post vital signs: Reviewed and stable  Last Vitals:  Vitals Value Taken Time  BP    Temp    Pulse    Resp    SpO2      Last Pain:  Vitals:   08/13/21 1208  TempSrc:   PainSc: 0-No pain         Complications: No notable events documented.

## 2021-08-15 ENCOUNTER — Other Ambulatory Visit (HOSPITAL_COMMUNITY): Payer: Self-pay

## 2021-08-15 DIAGNOSIS — Z681 Body mass index (BMI) 19 or less, adult: Secondary | ICD-10-CM | POA: Diagnosis not present

## 2021-08-15 DIAGNOSIS — F419 Anxiety disorder, unspecified: Secondary | ICD-10-CM | POA: Diagnosis not present

## 2021-08-15 DIAGNOSIS — E1165 Type 2 diabetes mellitus with hyperglycemia: Secondary | ICD-10-CM | POA: Diagnosis not present

## 2021-08-15 MED ORDER — ALPRAZOLAM 1 MG PO TABS
ORAL_TABLET | ORAL | 1 refills | Status: DC
  Filled 2021-08-15 – 2021-08-29 (×7): qty 270, 90d supply, fill #0
  Filled ????-??-??: fill #0

## 2021-08-17 ENCOUNTER — Encounter (HOSPITAL_COMMUNITY): Payer: Self-pay | Admitting: Internal Medicine

## 2021-08-18 ENCOUNTER — Other Ambulatory Visit (HOSPITAL_COMMUNITY): Payer: Self-pay

## 2021-08-22 ENCOUNTER — Other Ambulatory Visit (HOSPITAL_COMMUNITY): Payer: Self-pay

## 2021-08-24 ENCOUNTER — Other Ambulatory Visit (HOSPITAL_COMMUNITY): Payer: Self-pay

## 2021-08-25 ENCOUNTER — Other Ambulatory Visit (HOSPITAL_COMMUNITY): Payer: Self-pay

## 2021-08-27 ENCOUNTER — Other Ambulatory Visit (HOSPITAL_COMMUNITY): Payer: Self-pay

## 2021-08-29 ENCOUNTER — Other Ambulatory Visit (HOSPITAL_COMMUNITY): Payer: Self-pay

## 2021-09-03 DIAGNOSIS — R109 Unspecified abdominal pain: Secondary | ICD-10-CM | POA: Diagnosis not present

## 2021-09-03 DIAGNOSIS — Z6820 Body mass index (BMI) 20.0-20.9, adult: Secondary | ICD-10-CM | POA: Diagnosis not present

## 2021-09-03 DIAGNOSIS — M549 Dorsalgia, unspecified: Secondary | ICD-10-CM | POA: Diagnosis not present

## 2021-09-10 ENCOUNTER — Other Ambulatory Visit (HOSPITAL_COMMUNITY): Payer: Self-pay

## 2021-09-10 MED ORDER — METFORMIN HCL 500 MG PO TABS
ORAL_TABLET | ORAL | 1 refills | Status: DC
Start: 2021-09-10 — End: 2022-03-18
  Filled 2021-09-10: qty 90, 90d supply, fill #0
  Filled 2021-12-14: qty 90, 90d supply, fill #1

## 2021-10-01 DIAGNOSIS — M5136 Other intervertebral disc degeneration, lumbar region: Secondary | ICD-10-CM | POA: Diagnosis not present

## 2021-10-01 DIAGNOSIS — Z681 Body mass index (BMI) 19 or less, adult: Secondary | ICD-10-CM | POA: Diagnosis not present

## 2021-10-22 ENCOUNTER — Other Ambulatory Visit (HOSPITAL_COMMUNITY): Payer: Self-pay

## 2021-10-29 ENCOUNTER — Other Ambulatory Visit (HOSPITAL_COMMUNITY): Payer: Self-pay

## 2021-11-14 ENCOUNTER — Other Ambulatory Visit (HOSPITAL_COMMUNITY): Payer: Self-pay

## 2021-11-14 DIAGNOSIS — F419 Anxiety disorder, unspecified: Secondary | ICD-10-CM | POA: Diagnosis not present

## 2021-11-14 DIAGNOSIS — Z6821 Body mass index (BMI) 21.0-21.9, adult: Secondary | ICD-10-CM | POA: Diagnosis not present

## 2021-11-14 DIAGNOSIS — Z23 Encounter for immunization: Secondary | ICD-10-CM | POA: Diagnosis not present

## 2021-11-14 MED ORDER — ALPRAZOLAM 1 MG PO TABS
1.0000 mg | ORAL_TABLET | Freq: Three times a day (TID) | ORAL | 0 refills | Status: DC
  Filled 2021-11-14 – 2021-11-28 (×2): qty 270, 90d supply, fill #0

## 2021-11-19 ENCOUNTER — Other Ambulatory Visit (HOSPITAL_COMMUNITY): Payer: Self-pay

## 2021-11-20 ENCOUNTER — Other Ambulatory Visit (HOSPITAL_COMMUNITY): Payer: Self-pay

## 2021-11-28 ENCOUNTER — Other Ambulatory Visit (HOSPITAL_COMMUNITY): Payer: Self-pay

## 2021-12-14 ENCOUNTER — Other Ambulatory Visit (HOSPITAL_COMMUNITY): Payer: Self-pay

## 2022-01-01 DIAGNOSIS — K859 Acute pancreatitis without necrosis or infection, unspecified: Secondary | ICD-10-CM | POA: Diagnosis not present

## 2022-01-01 DIAGNOSIS — Z6820 Body mass index (BMI) 20.0-20.9, adult: Secondary | ICD-10-CM | POA: Diagnosis not present

## 2022-01-02 ENCOUNTER — Emergency Department (HOSPITAL_COMMUNITY): Payer: BC Managed Care – PPO

## 2022-01-02 ENCOUNTER — Other Ambulatory Visit: Payer: Self-pay

## 2022-01-02 ENCOUNTER — Encounter (HOSPITAL_COMMUNITY): Payer: Self-pay | Admitting: Emergency Medicine

## 2022-01-02 ENCOUNTER — Inpatient Hospital Stay (HOSPITAL_COMMUNITY)
Admission: EM | Admit: 2022-01-02 | Discharge: 2022-01-05 | DRG: 439 | Disposition: A | Discharge status: Home / Self Care | Payer: BC Managed Care – PPO | Attending: Internal Medicine | Admitting: Internal Medicine

## 2022-01-02 DIAGNOSIS — K859 Acute pancreatitis without necrosis or infection, unspecified: Principal | ICD-10-CM | POA: Diagnosis present

## 2022-01-02 DIAGNOSIS — K219 Gastro-esophageal reflux disease without esophagitis: Secondary | ICD-10-CM | POA: Diagnosis present

## 2022-01-02 DIAGNOSIS — E869 Volume depletion, unspecified: Secondary | ICD-10-CM | POA: Diagnosis not present

## 2022-01-02 DIAGNOSIS — E782 Mixed hyperlipidemia: Secondary | ICD-10-CM | POA: Diagnosis not present

## 2022-01-02 DIAGNOSIS — F419 Anxiety disorder, unspecified: Secondary | ICD-10-CM | POA: Diagnosis present

## 2022-01-02 DIAGNOSIS — E119 Type 2 diabetes mellitus without complications: Secondary | ICD-10-CM | POA: Diagnosis not present

## 2022-01-02 DIAGNOSIS — K760 Fatty (change of) liver, not elsewhere classified: Secondary | ICD-10-CM | POA: Diagnosis present

## 2022-01-02 DIAGNOSIS — R1013 Epigastric pain: Secondary | ICD-10-CM | POA: Diagnosis present

## 2022-01-02 DIAGNOSIS — N179 Acute kidney failure, unspecified: Secondary | ICD-10-CM | POA: Diagnosis not present

## 2022-01-02 DIAGNOSIS — Z7984 Long term (current) use of oral hypoglycemic drugs: Secondary | ICD-10-CM

## 2022-01-02 DIAGNOSIS — F17210 Nicotine dependence, cigarettes, uncomplicated: Secondary | ICD-10-CM | POA: Diagnosis present

## 2022-01-02 DIAGNOSIS — K861 Other chronic pancreatitis: Secondary | ICD-10-CM | POA: Diagnosis not present

## 2022-01-02 DIAGNOSIS — Z79899 Other long term (current) drug therapy: Secondary | ICD-10-CM | POA: Diagnosis not present

## 2022-01-02 DIAGNOSIS — K863 Pseudocyst of pancreas: Secondary | ICD-10-CM | POA: Diagnosis present

## 2022-01-02 DIAGNOSIS — K5730 Diverticulosis of large intestine without perforation or abscess without bleeding: Secondary | ICD-10-CM | POA: Diagnosis not present

## 2022-01-02 DIAGNOSIS — I10 Essential (primary) hypertension: Secondary | ICD-10-CM | POA: Diagnosis not present

## 2022-01-02 LAB — CBC WITH DIFFERENTIAL/PLATELET
Abs Immature Granulocytes: 0.03 10*3/uL (ref 0.00–0.07)
Basophils Absolute: 0.1 10*3/uL (ref 0.0–0.1)
Basophils Relative: 1 %
Eosinophils Absolute: 0.1 10*3/uL (ref 0.0–0.5)
Eosinophils Relative: 1 %
HCT: 33.1 % — ABNORMAL LOW (ref 39.0–52.0)
Hemoglobin: 10.7 g/dL — ABNORMAL LOW (ref 13.0–17.0)
Immature Granulocytes: 0 %
Lymphocytes Relative: 22 %
Lymphs Abs: 2.4 10*3/uL (ref 0.7–4.0)
MCH: 30.7 pg (ref 26.0–34.0)
MCHC: 32.3 g/dL (ref 30.0–36.0)
MCV: 94.8 fL (ref 80.0–100.0)
Monocytes Absolute: 0.8 10*3/uL (ref 0.1–1.0)
Monocytes Relative: 7 %
Neutro Abs: 7.4 10*3/uL (ref 1.7–7.7)
Neutrophils Relative %: 69 %
Platelets: 313 10*3/uL (ref 150–400)
RBC: 3.49 MIL/uL — ABNORMAL LOW (ref 4.22–5.81)
RDW: 14.6 % (ref 11.5–15.5)
WBC: 10.7 10*3/uL — ABNORMAL HIGH (ref 4.0–10.5)
nRBC: 0 % (ref 0.0–0.2)

## 2022-01-02 LAB — URINALYSIS, ROUTINE W REFLEX MICROSCOPIC
Bilirubin Urine: NEGATIVE
Glucose, UA: NEGATIVE mg/dL
Hgb urine dipstick: NEGATIVE
Ketones, ur: NEGATIVE mg/dL
Leukocytes,Ua: NEGATIVE
Nitrite: NEGATIVE
Protein, ur: NEGATIVE mg/dL
Specific Gravity, Urine: 1.01 (ref 1.005–1.030)
pH: 5 (ref 5.0–8.0)

## 2022-01-02 LAB — COMPREHENSIVE METABOLIC PANEL
ALT: 21 U/L (ref 0–44)
AST: 23 U/L (ref 15–41)
Albumin: 4.3 g/dL (ref 3.5–5.0)
Alkaline Phosphatase: 37 U/L — ABNORMAL LOW (ref 38–126)
Anion gap: 12 (ref 5–15)
BUN: 81 mg/dL — ABNORMAL HIGH (ref 8–23)
CO2: 20 mmol/L — ABNORMAL LOW (ref 22–32)
Calcium: 9.3 mg/dL (ref 8.9–10.3)
Chloride: 99 mmol/L (ref 98–111)
Creatinine, Ser: 3.6 mg/dL — ABNORMAL HIGH (ref 0.61–1.24)
GFR, Estimated: 18 mL/min — ABNORMAL LOW (ref >60)
Glucose, Bld: 85 mg/dL (ref 70–99)
Potassium: 4.8 mmol/L (ref 3.5–5.1)
Sodium: 131 mmol/L — ABNORMAL LOW (ref 135–145)
Total Bilirubin: 0.8 mg/dL (ref 0.3–1.2)
Total Protein: 8.3 g/dL — ABNORMAL HIGH (ref 6.5–8.1)

## 2022-01-02 LAB — GLUCOSE, CAPILLARY: Glucose-Capillary: 85 mg/dL (ref 70–99)

## 2022-01-02 LAB — LIPASE, BLOOD: Lipase: 304 U/L — ABNORMAL HIGH (ref 11–51)

## 2022-01-02 MED ORDER — ACETAMINOPHEN 325 MG PO TABS
650.0000 mg | ORAL_TABLET | Freq: Four times a day (QID) | ORAL | Status: DC | PRN
  Administered 2022-01-05: 650 mg via ORAL
  Filled 2022-01-02: qty 2

## 2022-01-02 MED ORDER — MORPHINE SULFATE (PF) 4 MG/ML IV SOLN
4.0000 mg | Freq: Once | INTRAVENOUS | Status: AC
  Administered 2022-01-02: 4 mg via INTRAVENOUS
  Filled 2022-01-02: qty 1

## 2022-01-02 MED ORDER — DEXTROSE-NACL 5-0.9 % IV SOLN: INTRAVENOUS | Status: DC

## 2022-01-02 MED ORDER — ONDANSETRON HCL 4 MG/2ML IJ SOLN: 4.0000 mg | Freq: Three times a day (TID) | INTRAMUSCULAR | Status: DC | PRN

## 2022-01-02 MED ORDER — PANTOPRAZOLE SODIUM 40 MG IV SOLR
40.0000 mg | INTRAVENOUS | Status: DC
  Administered 2022-01-02 – 2022-01-04 (×3): 40 mg via INTRAVENOUS
  Filled 2022-01-02 (×3): qty 10

## 2022-01-02 MED ORDER — MORPHINE SULFATE (PF) 4 MG/ML IV SOLN
4.0000 mg | INTRAVENOUS | Status: DC | PRN
  Administered 2022-01-02 – 2022-01-03 (×2): 4 mg via INTRAVENOUS
  Filled 2022-01-02 (×2): qty 1

## 2022-01-02 MED ORDER — ACETAMINOPHEN 650 MG RE SUPP: 650.0000 mg | Freq: Four times a day (QID) | RECTAL | Status: DC | PRN

## 2022-01-02 MED ORDER — LACTATED RINGERS IV BOLUS
2000.0000 mL | Freq: Once | INTRAVENOUS | Status: AC
  Administered 2022-01-02: 2000 mL via INTRAVENOUS

## 2022-01-02 MED ORDER — HEPARIN SODIUM (PORCINE) 5000 UNIT/ML IJ SOLN
5000.0000 [IU] | Freq: Three times a day (TID) | INTRAMUSCULAR | Status: DC
  Administered 2022-01-03 – 2022-01-04 (×6): 5000 [IU] via SUBCUTANEOUS
  Filled 2022-01-02 (×7): qty 1

## 2022-01-02 MED ORDER — ONDANSETRON HCL 4 MG/2ML IJ SOLN
4.0000 mg | Freq: Once | INTRAMUSCULAR | Status: AC
  Administered 2022-01-02: 4 mg via INTRAVENOUS
  Filled 2022-01-02: qty 2

## 2022-01-02 MED ORDER — POLYETHYLENE GLYCOL 3350 17 G PO PACK: 17.0000 g | PACK | Freq: Every day | ORAL | Status: DC | PRN

## 2022-01-02 MED ORDER — ONDANSETRON HCL 4 MG PO TABS: 4.0000 mg | ORAL_TABLET | Freq: Three times a day (TID) | ORAL | Status: DC | PRN

## 2022-01-02 NOTE — ED Triage Notes (Signed)
Pt reports he was seen by PCP for abd pain, was called today with abnormal labs and told to come to the ER, WBC 14.3, BUN 65, Lipase 476

## 2022-01-02 NOTE — H&P (Signed)
History and Physical    Kyle Giles:[id number] DOB: [birth year] DOA: 01/02/2022  PCP: Sharilyn Sites, MD   Patient coming from: Home  I have personally briefly reviewed patient's old medical records in [place]  Chief Complaint: Abdominal pain,abnormal blood work  HPI: Kyle Giles is a 61 y.o. male with medical history significant for alcoholic pancreatitis, alcoholic hepatic steatosis, alcohol abuse. Patient presented to ED with complaints of abdominal pain that started 4 days ago.  He saw his outpatient provider, had blood work done that showed abnormal lipase in the 400s, and AKI creatinine of about 2, with leukocytosis.  He was referred to the ED.  He reports persistent abdominal pain since onset.  No nausea no vomiting.  He has been eating a bland diet since onset, she was noted to aggravate the pain.  No loose stools.  Reports some heaviness with urination but no pain.  Reports episodes of dizziness today while at work.  Has been taking a lot of ibuprofen over the past few days.  Also been taking his antihypertensives- Benicar.  He reports he has no drunk any alcoholic beverage since hospitalization in March for acute pancreatitis.  ED Course: Tmax 99.1.  Stable vitals.  Lipase 304.  Creatinine elevated 3.6.  WBC 10.7.  UA not suggestive of UTI. CT abdomen and pelvis without contrast showing acute on chronic pancreatitis, with pseudocyst within the tail of the pancreas and acute peripancreatic inflammatory fluid extending between the pancreas and lesser curvature of the stomach,  2 Liter bolus LR given.  Morphine 4 mg x 1 given.  Hospitalist admit for AKI and pancreatitis.  Review of Systems: As per HPI all other systems reviewed and negative.  Past Medical History:  Diagnosis Date   Anxiety    DM type 2 (diabetes mellitus, type 2) (HCC)    GERD (gastroesophageal reflux disease)    Heart murmur    saw dr Willeen Cass 06-29-2019 echo done    Hypercholesterolemia     Hypertension    Palpitations    none in a long time as of 10-19-2019   Pancreatitis    S/P colonoscopy 09/2009   Dr. Arnoldo Morale: Moderate diverticulosis throughout colon, otherwise normal    Umbilical hernia     Past Surgical History:  Procedure Laterality Date   COLONOSCOPY N/A 12/21/2019   Procedure: COLONOSCOPY;  Surgeon: Aviva Signs, MD;  [facility];  Service: Gastroenterology;  Laterality: N/A;   cyst removed from finger  age 76   ESOPHAGOGASTRODUODENOSCOPY (EGD) WITH PROPOFOL N/A 08/13/2021   Procedure: ESOPHAGOGASTRODUODENOSCOPY (EGD) WITH PROPOFOL;  Surgeon: Eloise Harman, DO;  [facility];  Service: Endoscopy;  Laterality: N/A;  1:30pm   HERNIA REPAIR     as baby   POLYPECTOMY  12/21/2019   Procedure: POLYPECTOMY;  Surgeon: Aviva Signs, MD;  Location: [facility];  Service: Gastroenterology;;   pyloric stenosis repair     as baby, operated at [place] 10/29/2019   Procedure: OPEN UMBILICAL [place];  Surgeon: Clovis Riley, MD;  Location: [place];  Service: General;  Laterality: N/A;     reports that he has been smoking cigarettes. He has a 45.00 pack-year smoking history. He has been exposed to tobacco smoke. He has never used smokeless tobacco. He reports that he does not currently use alcohol. He reports that he does not currently use drugs.  No Known Allergies  Family History  Problem Relation Age of Onset   Colon cancer Neg Hx     Prior to Admission medications   Medication Sig Start Date End Date Taking? Authorizing Provider  ALPRAZolam Duanne Moron) 1 MG tablet Take 1 tablet by mouth three times daily. Patient taking differently: Take 1 mg by mouth 3 (three) times daily as needed for anxiety. 05/18/21     ALPRAZolam (XANAX) 1 MG tablet Take 1 tablet by mouth 3 times daily. 08/15/21     ALPRAZolam (XANAX) 1 MG tablet Take 1 tablet (1 mg total) by mouth 3 (three) times daily.  11/14/21     cetirizine (ZYRTEC) 10 MG tablet TAKE (1) TABLET BY MOUTH ONCE DAILY. 08/09/21     cholecalciferol (VITAMIN D3) 25 MCG (1000 UNIT) tablet Take 1,000 Units by mouth 2 (two) times daily.    [provider]  diltiazem (CARDIZEM CD) 360 MG 24 hr capsule Take 1 capsule (360 mg total) by mouth every evening. 07/23/21     esomeprazole (NEXIUM) 20 MG capsule Take 20 mg by mouth daily at 12 noon.    [provider]  fenofibrate 160 MG tablet Take 1 tablet by mouth daily 04/27/21     ibuprofen (ADVIL) 200 MG tablet Take 400 mg by mouth every 6 (six) hours as needed for moderate pain.    [provider]  metFORMIN (GLUCOPHAGE) 500 MG tablet Take 250 mg by mouth 2 (two) times daily with a meal.    [provider]  metFORMIN (GLUCOPHAGE) 500 MG tablet Take 1/2 tablet by mouth twice daily. 09/10/21     olmesartan-hydrochlorothiazide (BENICAR HCT) 40-25 MG tablet Take 1 tablet by mouth once a day 08/09/21     Propylene Glycol (SYSTANE COMPLETE) 0.6 % SOLN Place 1 drop into both eyes daily.    [provider]  simvastatin (ZOCOR) 20 MG tablet TAKE 1 TABLET BY MOUTH ONCE DAILY. 07/23/21       Physical Exam: Vitals:   01/02/22 1725 01/02/22 1728  BP:  (!) 113/51  Pulse:  74  Resp:  14  Temp:  99.1 F (37.3 C)  TempSrc:  Oral  SpO2:  98%  Weight: 52.2 kg   Height: 5' 2"  (1.575 m)     Constitutional: NAD, calm, comfortable Vitals:   01/02/22 1725 01/02/22 1728  BP:  (!) 113/51  Pulse:  74  Resp:  14  Temp:  99.1 F (37.3 C)  TempSrc:  Oral  SpO2:  98%  Weight: 52.2 kg   Height: 5' 2"  (1.575 m)    Eyes: PERRL, lids and conjunctivae normal ENMT: Mucous membranes are moist.  Neck: normal, supple, no masses, no thyromegaly Respiratory: clear to auscultation bilaterally, no wheezing, no crackles. Normal respiratory effort. No accessory muscle use.  Cardiovascular: Regular rate and rhythm, no murmurs / rubs / gallops. No extremity edema.   Extremities warm.   Abdomen: Mild Epigastric tenderness, no masses palpated. No hepatosplenomegaly.  Musculoskeletal: no clubbing / cyanosis. No joint deformity upper and lower extremities.  Skin: no rashes, lesions, ulcers. No induration Neurologic: No apparent cranial abnormality, moving extremities spontaneously. Psychiatric: Normal judgment and insight. Alert and oriented x 3. Normal mood.   Labs on Admission: I have personally reviewed following labs and imaging studies  CBC: Recent Labs  Lab 01/02/22 1900  WBC 10.7*  NEUTROABS 7.4  HGB 10.7*  HCT 33.1*  MCV 94.8  PLT 110   Basic Metabolic Panel: Recent Labs  Lab 01/02/22 1900  NA 131*  K 4.8  CL 99  CO2 20*  GLUCOSE 85  BUN 81*  CREATININE 3.60*  CALCIUM 9.3    Liver Function Tests: Recent Labs  Lab 01/02/22 1900  AST 23  ALT 21  ALKPHOS 37*  BILITOT 0.8  PROT 8.3*  ALBUMIN 4.3   Recent Labs  Lab 01/02/22 1900  LIPASE 304*  Urine analysis:    Component Value Date/Time   COLORURINE YELLOW 01/02/2022 1900   APPEARANCEUR CLEAR 01/02/2022 1900   LABSPEC 1.010 01/02/2022 1900   PHURINE 5.0 01/02/2022 1900   GLUCOSEU NEGATIVE 01/02/2022 1900   HGBUR NEGATIVE 01/02/2022 1900   BILIRUBINUR NEGATIVE 01/02/2022 1900   KETONESUR NEGATIVE 01/02/2022 1900   PROTEINUR NEGATIVE 01/02/2022 1900   NITRITE NEGATIVE 01/02/2022 1900   LEUKOCYTESUR NEGATIVE 01/02/2022 1900    Radiological Exams on Admission: CT ABDOMEN PELVIS WO CONTRAST  Result Date: 01/02/2022 CLINICAL DATA:  Abdominal pain with elevated lipase EXAM: CT ABDOMEN AND PELVIS WITHOUT CONTRAST TECHNIQUE: Multidetector CT imaging of the abdomen and pelvis was performed following the standard protocol without IV contrast. RADIATION DOSE REDUCTION: This exam was performed according to the departmental dose-optimization program which includes automated exposure control, adjustment of the mA and/or kV according to patient size and/or use of iterative  reconstruction technique. COMPARISON:  CT abdomen and pelvis 04/21/2021 FINDINGS: Lower chest: No acute abnormality. Hepatobiliary: No focal liver abnormality is seen. No gallstones, gallbladder wall thickening, or biliary dilatation. Pancreas: Since 04/21/2021 there is a new 5.7 cm cystic lesion in the tail of the pancreas likely a pseudocyst. Peripancreatic fluid is again seen. Peripancreatic fluid and stranding extend from the pseudocyst to the lesser curvature of the stomach which is indistinct and hazy. No free intraperitoneal air to suggest perforation. Mild prominence of the main pancreatic duct increased from prior now measuring approximally 5 mm. There are coarse calcifications in the pancreatic head. Spleen: Normal in size without focal abnormality. Adrenals/Urinary Tract: Adrenal glands are unremarkable. Kidneys are normal, without renal calculi, focal lesion, or hydronephrosis. Bladder is unremarkable. Stomach/Bowel: Indistinctness of the wall of the lesser curvature of the stomach which appears confluence with the peripancreatic stranding. Normal caliber large and small bowel. Normal appendix. Extensive colonic diverticulosis without diverticulitis. Vascular/Lymphatic: Aortic atherosclerosis. No enlarged abdominal or pelvic lymph nodes. Reproductive: Prostate is unremarkable. Other: No free intraperitoneal air. Musculoskeletal: No acute osseous abnormality. IMPRESSION: Acute on chronic pancreatitis. There is a pseudocyst within the tail of the pancreas and acute peripancreatic inflammatory fluid extending between the pancreas and lesser curvature of the stomach. The lesser curvature of the stomach wall is indistinct near the gastric antrum and confluence with the peripancreatic fluid. This is likely due to reactive edema. No free intraperitoneal air to suggest perforation. Colonic diverticulosis without diverticulitis. Electronically Signed   By: Placido Sou M.D.   On: 01/02/2022 20:17    EKG:  None.   Assessment/Plan Principal Problem:   Acute on chronic pancreatitis (HCC) Active Problems:   AKI (acute kidney injury) ([place])   HTN (hypertension)   Assessment and Plan: * Acute on chronic pancreatitis ([place]) Acute on chronic alcoholic pancreatitis.  Presents with abdominal pain.  No vomiting.  Lipase elevated at 304.  WBC 10.7.  Alcoholic beverage since 03/98. CT abdomen pelvis without contrast- Acute on chronic pancreatitis. There is a pseudocyst within the tail of the pancreas and acute peripancreatic inflammatory fluid extending between the pancreas and lesser curvature of the stomach.  -2 L bolus given, continue D5 N/s 125cc/hr -IV Morphine 4 mg every 4  hours as needed -Bowel rest n.p.o. -IV Protonix 40 daily - Monitor CBG  q 8h  AKI (acute kidney injury) ([place]) Creatinine elevated 3.6, baseline 0.8-1.1.  Likely prerenal from reduced oral intake with olmesartan-HCTZ use and ibuprofen. -Hydrate -Hold olmesartan HCTZ  HTN (hypertension) Stable. -Hold olmesartan and HCTZ -Resume diltiazem   DVT prophylaxis: Heparin Code Status: Full code Family Communication: Spouse at bedside Disposition Plan: ~ 2 days Consults called: None Admission status: Obs tele  Author: Bethena Roys, MD 01/02/2022 9:38 PM  For on call review [email or web address].

## 2022-01-02 NOTE — Assessment & Plan Note (Signed)
Stable. -Hold olmesartan and HCTZ -Resume diltiazem

## 2022-01-02 NOTE — ED Provider Notes (Signed)
[facility] EMERGENCY DEPARTMENT Provider Note   CSN: [id number] Arrival date & time: 01/02/22  1655     History  Chief Complaint  Patient presents with   Abnormal Lab    Kyle Giles is a 61 y.o. male.  61 year old male presents today for evaluation of abnormal labs.  He states he has been having epigastric abdominal pain since Saturday.  He was seen at his PCP office yesterday and had labs obtained.  Today he received a call to come to the emergency room for abnormal labs.  Particularly his creatinine, leukocytosis, and elevated lipase.  He has been tolerating p.o. intake without difficulty.  Currently reports pain is 5/10.  States since his admission in March he has not had any alcoholic beverages since his admission in March.  States his job is very demanding and taxing.  Has not been keeping up with his hydration.  The history is provided by the patient. No language interpreter was used.       Home Medications Prior to Admission medications   Medication Sig Start Date End Date Taking? Authorizing Provider  ALPRAZolam Duanne Moron) 1 MG tablet Take 1 tablet by mouth three times daily. Patient taking differently: Take 1 mg by mouth 3 (three) times daily as needed for anxiety. 05/18/21     ALPRAZolam (XANAX) 1 MG tablet Take 1 tablet by mouth 3 times daily. 08/15/21     ALPRAZolam (XANAX) 1 MG tablet Take 1 tablet (1 mg total) by mouth 3 (three) times daily. 11/14/21     cetirizine (ZYRTEC) 10 MG tablet TAKE (1) TABLET BY MOUTH ONCE DAILY. 08/09/21     cholecalciferol (VITAMIN D3) 25 MCG (1000 UNIT) tablet Take 1,000 Units by mouth 2 (two) times daily.    [provider]  diltiazem (CARDIZEM CD) 360 MG 24 hr capsule Take 1 capsule (360 mg total) by mouth every evening. 07/23/21     esomeprazole (NEXIUM) 20 MG capsule Take 20 mg by mouth daily at 12 noon.    [provider]  fenofibrate 160 MG tablet Take 1 tablet by mouth daily 04/27/21     ibuprofen (ADVIL) 200 MG  tablet Take 400 mg by mouth every 6 (six) hours as needed for moderate pain.    [provider]  metFORMIN (GLUCOPHAGE) 500 MG tablet Take 250 mg by mouth 2 (two) times daily with a meal.    [provider]  metFORMIN (GLUCOPHAGE) 500 MG tablet Take 1/2 tablet by mouth twice daily. 09/10/21     olmesartan-hydrochlorothiazide (BENICAR HCT) 40-25 MG tablet Take 1 tablet by mouth once a day 08/09/21     Propylene Glycol (SYSTANE COMPLETE) 0.6 % SOLN Place 1 drop into both eyes daily.    [provider]  simvastatin (ZOCOR) 20 MG tablet TAKE 1 TABLET BY MOUTH ONCE DAILY. 07/23/21         Allergies    Patient has no known allergies.    Review of Systems   Review of Systems  Constitutional:  Negative for fever.  Cardiovascular:  Negative for chest pain.  Gastrointestinal:  Positive for abdominal pain. Negative for blood in stool, nausea and vomiting.  Neurological:  Negative for light-headedness.    Physical Exam Updated Vital Signs BP (!) 113/51 (BP Location: Right Arm)   Pulse 74   Temp 99.1 F (37.3 C) (Oral)   Resp 14   Ht 5' 2"  (1.575 m)   Wt 52.2 kg   SpO2 98%   BMI 21.03 kg/m  Physical Exam Vitals and nursing note reviewed.  Constitutional:      General: He is not in acute distress.    Appearance: Normal appearance. He is not ill-appearing.  HENT:     Head: Normocephalic and atraumatic.     Nose: Nose normal.  Eyes:     General: No scleral icterus.    Extraocular Movements: Extraocular movements intact.     Conjunctiva/sclera: Conjunctivae normal.  Cardiovascular:     Rate and Rhythm: Normal rate and regular rhythm.     Pulses: Normal pulses.  Pulmonary:     Effort: Pulmonary effort is normal. No respiratory distress.     Breath sounds: Normal breath sounds. No wheezing or rales.  Abdominal:     General: There is no distension.     Palpations: Abdomen is soft.     Tenderness: There is abdominal tenderness. There is no guarding or rebound.   Musculoskeletal:        General: Normal range of motion.     Cervical back: Normal range of motion.  Skin:    General: Skin is warm and dry.  Neurological:     General: No focal deficit present.     Mental Status: He is alert and oriented to person, place, and time. Mental status is at baseline.     ED Results / Procedures / Treatments   Labs (all labs ordered are listed, but only abnormal results are displayed) Labs Reviewed  CBC WITH DIFFERENTIAL/PLATELET - Abnormal; Notable for the following components:      Result Value   WBC 10.7 (*)    RBC 3.49 (*)    Hemoglobin 10.7 (*)    HCT 33.1 (*)    All other components within normal limits  COMPREHENSIVE METABOLIC PANEL - Abnormal; Notable for the following components:   Sodium 131 (*)    CO2 20 (*)    BUN 81 (*)    Creatinine, Ser 3.60 (*)    Total Protein 8.3 (*)    Alkaline Phosphatase 37 (*)    GFR, Estimated 18 (*)    All other components within normal limits  LIPASE, BLOOD - Abnormal; Notable for the following components:   Lipase 304 (*)    All other components within normal limits  URINALYSIS, ROUTINE W REFLEX MICROSCOPIC    EKG None  Radiology CT ABDOMEN PELVIS WO CONTRAST  Result Date: 01/02/2022 CLINICAL DATA:  Abdominal pain with elevated lipase EXAM: CT ABDOMEN AND PELVIS WITHOUT CONTRAST TECHNIQUE: Multidetector CT imaging of the abdomen and pelvis was performed following the standard protocol without IV contrast. RADIATION DOSE REDUCTION: This exam was performed according to the departmental dose-optimization program which includes automated exposure control, adjustment of the mA and/or kV according to patient size and/or use of iterative reconstruction technique. COMPARISON:  CT abdomen and pelvis 04/21/2021 FINDINGS: Lower chest: No acute abnormality. Hepatobiliary: No focal liver abnormality is seen. No gallstones, gallbladder wall thickening, or biliary dilatation. Pancreas: Since 04/21/2021 there is a  new 5.7 cm cystic lesion in the tail of the pancreas likely a pseudocyst. Peripancreatic fluid is again seen. Peripancreatic fluid and stranding extend from the pseudocyst to the lesser curvature of the stomach which is indistinct and hazy. No free intraperitoneal air to suggest perforation. Mild prominence of the main pancreatic duct increased from prior now measuring approximally 5 mm. There are coarse calcifications in the pancreatic head. Spleen: Normal in size without focal abnormality. Adrenals/Urinary Tract: Adrenal glands are unremarkable. Kidneys are normal, without renal calculi,  focal lesion, or hydronephrosis. Bladder is unremarkable. Stomach/Bowel: Indistinctness of the wall of the lesser curvature of the stomach which appears confluence with the peripancreatic stranding. Normal caliber large and small bowel. Normal appendix. Extensive colonic diverticulosis without diverticulitis. Vascular/Lymphatic: Aortic atherosclerosis. No enlarged abdominal or pelvic lymph nodes. Reproductive: Prostate is unremarkable. Other: No free intraperitoneal air. Musculoskeletal: No acute osseous abnormality. IMPRESSION: Acute on chronic pancreatitis. There is a pseudocyst within the tail of the pancreas and acute peripancreatic inflammatory fluid extending between the pancreas and lesser curvature of the stomach. The lesser curvature of the stomach wall is indistinct near the gastric antrum and confluence with the peripancreatic fluid. This is likely due to reactive edema. No free intraperitoneal air to suggest perforation. Colonic diverticulosis without diverticulitis. Electronically Signed   By: Placido Sou M.D.   On: 01/02/2022 20:17    Procedures Procedures    Medications Ordered in ED Medications  lactated ringers bolus 2,000 mL (2,000 mLs Intravenous Bolus 01/02/22 1908)  morphine (PF) 4 MG/ML injection 4 mg (4 mg Intravenous Given 01/02/22 1908)  ondansetron (ZOFRAN) injection 4 mg (4 mg Intravenous  Given 01/02/22 1908)    ED Course/ Medical Decision Making/ A&P Clinical Course as of 01/02/22 2148  Wed Jan 02, 2022  2109 Anion gap: 12 [AA]    Clinical Course User Index [AA] Evlyn Courier, PA-C                           Medical Decision Making Amount and/or Complexity of Data Reviewed Labs: ordered. Radiology: ordered.  Risk Prescription drug management.   Medical Decision Making / ED Course   This patient presents to the ED for concern of abdominal pain, this involves an extensive number of treatment options, and is a complaint that carries with it a high risk of complications and morbidity.  The differential diagnosis includes pancreatitis, appendicitis, cholecystitis,  MDM: 61 year old male presents today for evaluation of epigastric abdominal pain.  Has history of pancreatitis.  CT abdomen volumes consistent with gastroenteritis without complicating features.  Blood work done yesterday which showed cytosis, elevated lipase, AKI.  Work-up today shows worsening creatinine of 3.6, leukocytosis of 12.7 without left shift.  Lipase improved to 304 compared to yesterday.  UA without evidence of UTI.  Discussed with hospitalist for admission due to AKI.  They will evaluate patient for admission.  LFTs normal.  No evidence of biliary obstruction.   Additional history obtained: -Additional history obtained from previous admission. -External records from outside source obtained and reviewed including: Chart review including previous notes, labs, imaging, consultation notes   Lab Tests: -I ordered, reviewed, and interpreted labs.   The pertinent results include:   Labs Reviewed  CBC WITH DIFFERENTIAL/PLATELET - Abnormal; Notable for the following components:      Result Value   WBC 10.7 (*)    RBC 3.49 (*)    Hemoglobin 10.7 (*)    HCT 33.1 (*)    All other components within normal limits  COMPREHENSIVE METABOLIC PANEL - Abnormal; Notable for the following components:   Sodium  131 (*)    CO2 20 (*)    BUN 81 (*)    Creatinine, Ser 3.60 (*)    Total Protein 8.3 (*)    Alkaline Phosphatase 37 (*)    GFR, Estimated 18 (*)    All other components within normal limits  LIPASE, BLOOD - Abnormal; Notable for the following components:   Lipase 304 (*)  All other components within normal limits  URINALYSIS, ROUTINE W REFLEX MICROSCOPIC      EKG  EKG Interpretation  Date/Time:    Ventricular Rate:    PR Interval:    QRS Duration:   QT Interval:    QTC Calculation:   R Axis:     Text Interpretation:           Imaging Studies ordered: I ordered imaging studies including CT abdomen pelvis without contrast I independently visualized and interpreted imaging. I agree with the radiologist interpretation   Medicines ordered and prescription drug management: Meds ordered this encounter  Medications   lactated ringers bolus 2,000 mL   morphine (PF) 4 MG/ML injection 4 mg   ondansetron (ZOFRAN) injection 4 mg   morphine (PF) 4 MG/ML injection 4 mg    -I have reviewed the patients home medicines and have made adjustments as needed  Critical interventions Fluid bolus, pain medication   Reevaluation: After the interventions noted above, I reevaluated the patient and found that they have :improved  Co morbidities that complicate the patient evaluation  Past Medical History:  Diagnosis Date   Anxiety    DM type 2 (diabetes mellitus, type 2) (HCC)    GERD (gastroesophageal reflux disease)    Heart murmur    saw dr Willeen Cass 06-29-2019 echo done    Hypercholesterolemia    Hypertension    Palpitations    none in a long time as of 10-19-2019   Pancreatitis    S/P colonoscopy 09/2009   Dr. Arnoldo Morale: Moderate diverticulosis throughout colon, otherwise normal    Umbilical hernia       Dispostion: Patient discussed with hospitalist who will evaluate him for admission.  Final Clinical Impression(s) / ED Diagnoses Final diagnoses:  AKI (acute  kidney injury) ([place])  Acute pancreatitis, unspecified complication status, unspecified pancreatitis type    Rx / DC Orders ED Discharge Orders     None         Evlyn Courier, PA-C 01/02/22 2212    Noemi Chapel, MD 01/03/22 1325

## 2022-01-02 NOTE — Assessment & Plan Note (Signed)
Creatinine elevated 3.6, baseline 0.8-1.1.  Likely prerenal from reduced oral intake with olmesartan-HCTZ use and ibuprofen. -Hydrate -Hold olmesartan HCTZ

## 2022-01-02 NOTE — Assessment & Plan Note (Addendum)
Acute on chronic alcoholic pancreatitis.  Presents with abdominal pain.  No vomiting.  Lipase elevated at 304.  WBC 10.7.  Alcoholic beverage since 10/4852. CT abdomen pelvis without contrast- Acute on chronic pancreatitis. There is a pseudocyst within the tail of the pancreas and acute peripancreatic inflammatory fluid extending between the pancreas and lesser curvature of the stomach.  -2 L bolus given, continue D5 N/s 125cc/hr -IV Morphine 4 mg every 4 hours as needed -Bowel rest n.p.o. -IV Protonix 40 daily - Monitor CBG  q 8h

## 2022-01-03 DIAGNOSIS — E869 Volume depletion, unspecified: Secondary | ICD-10-CM | POA: Diagnosis present

## 2022-01-03 DIAGNOSIS — N179 Acute kidney failure, unspecified: Secondary | ICD-10-CM | POA: Diagnosis not present

## 2022-01-03 DIAGNOSIS — R1013 Epigastric pain: Secondary | ICD-10-CM | POA: Diagnosis not present

## 2022-01-03 DIAGNOSIS — F17210 Nicotine dependence, cigarettes, uncomplicated: Secondary | ICD-10-CM | POA: Diagnosis present

## 2022-01-03 DIAGNOSIS — Z79899 Other long term (current) drug therapy: Secondary | ICD-10-CM | POA: Diagnosis not present

## 2022-01-03 DIAGNOSIS — I10 Essential (primary) hypertension: Secondary | ICD-10-CM | POA: Diagnosis not present

## 2022-01-03 DIAGNOSIS — Z7984 Long term (current) use of oral hypoglycemic drugs: Secondary | ICD-10-CM | POA: Diagnosis not present

## 2022-01-03 DIAGNOSIS — K859 Acute pancreatitis without necrosis or infection, unspecified: Secondary | ICD-10-CM | POA: Diagnosis not present

## 2022-01-03 DIAGNOSIS — K760 Fatty (change of) liver, not elsewhere classified: Secondary | ICD-10-CM | POA: Diagnosis present

## 2022-01-03 DIAGNOSIS — E782 Mixed hyperlipidemia: Secondary | ICD-10-CM | POA: Diagnosis present

## 2022-01-03 DIAGNOSIS — K863 Pseudocyst of pancreas: Secondary | ICD-10-CM | POA: Diagnosis present

## 2022-01-03 DIAGNOSIS — K861 Other chronic pancreatitis: Secondary | ICD-10-CM | POA: Diagnosis not present

## 2022-01-03 DIAGNOSIS — E119 Type 2 diabetes mellitus without complications: Secondary | ICD-10-CM | POA: Diagnosis present

## 2022-01-03 DIAGNOSIS — K219 Gastro-esophageal reflux disease without esophagitis: Secondary | ICD-10-CM | POA: Diagnosis present

## 2022-01-03 DIAGNOSIS — F419 Anxiety disorder, unspecified: Secondary | ICD-10-CM | POA: Diagnosis present

## 2022-01-03 LAB — CBC
HCT: 32.1 % — ABNORMAL LOW (ref 39.0–52.0)
Hemoglobin: 10.4 g/dL — ABNORMAL LOW (ref 13.0–17.0)
MCH: 31 pg (ref 26.0–34.0)
MCHC: 32.4 g/dL (ref 30.0–36.0)
MCV: 95.5 fL (ref 80.0–100.0)
Platelets: 291 10*3/uL (ref 150–400)
RBC: 3.36 MIL/uL — ABNORMAL LOW (ref 4.22–5.81)
RDW: 14.3 % (ref 11.5–15.5)
WBC: 9.2 10*3/uL (ref 4.0–10.5)
nRBC: 0 % (ref 0.0–0.2)

## 2022-01-03 LAB — COMPREHENSIVE METABOLIC PANEL
ALT: 17 U/L (ref 0–44)
AST: 20 U/L (ref 15–41)
Albumin: 3.6 g/dL (ref 3.5–5.0)
Alkaline Phosphatase: 35 U/L — ABNORMAL LOW (ref 38–126)
Anion gap: 8 (ref 5–15)
BUN: 69 mg/dL — ABNORMAL HIGH (ref 8–23)
CO2: 24 mmol/L (ref 22–32)
Calcium: 9 mg/dL (ref 8.9–10.3)
Chloride: 107 mmol/L (ref 98–111)
Creatinine, Ser: 2.86 mg/dL — ABNORMAL HIGH (ref 0.61–1.24)
GFR, Estimated: 24 mL/min — ABNORMAL LOW (ref >60)
Glucose, Bld: 111 mg/dL — ABNORMAL HIGH (ref 70–99)
Potassium: 4.2 mmol/L (ref 3.5–5.1)
Sodium: 139 mmol/L (ref 135–145)
Total Bilirubin: 0.3 mg/dL (ref 0.3–1.2)
Total Protein: 6.9 g/dL (ref 6.5–8.1)

## 2022-01-03 LAB — GLUCOSE, CAPILLARY
Glucose-Capillary: 91 mg/dL (ref 70–99)
Glucose-Capillary: 74 mg/dL (ref 70–99)

## 2022-01-03 LAB — LIPID PANEL
Cholesterol: 101 mg/dL (ref 0–200)
HDL: 16 mg/dL — ABNORMAL LOW (ref >40)
LDL Cholesterol: 62 mg/dL (ref 0–99)
Total CHOL/HDL Ratio: 6.3 RATIO
Triglycerides: 113 mg/dL (ref <150)
VLDL: 23 mg/dL (ref 0–40)

## 2022-01-03 MED ORDER — ALPRAZOLAM 0.5 MG PO TABS
0.5000 mg | ORAL_TABLET | Freq: Three times a day (TID) | ORAL | Status: DC | PRN
  Administered 2022-01-04 – 2022-01-05 (×3): 0.5 mg via ORAL
  Filled 2022-01-03 (×3): qty 1

## 2022-01-03 MED ORDER — MORPHINE SULFATE (PF) 4 MG/ML IV SOLN
4.0000 mg | INTRAVENOUS | Status: DC | PRN
  Administered 2022-01-04: 4 mg via INTRAVENOUS
  Filled 2022-01-03 (×2): qty 1

## 2022-01-03 MED ORDER — SIMVASTATIN 20 MG PO TABS
20.0000 mg | ORAL_TABLET | Freq: Every day | ORAL | Status: DC
  Administered 2022-01-03 – 2022-01-04 (×2): 20 mg via ORAL
  Filled 2022-01-03 (×2): qty 1

## 2022-01-03 MED ORDER — OXYCODONE HCL 5 MG PO TABS
5.0000 mg | ORAL_TABLET | Freq: Four times a day (QID) | ORAL | Status: DC | PRN
  Administered 2022-01-03 – 2022-01-05 (×4): 5 mg via ORAL
  Filled 2022-01-03 (×4): qty 1

## 2022-01-03 MED ORDER — LACTATED RINGERS IV SOLN: INTRAVENOUS | Status: DC

## 2022-01-03 MED ORDER — MORPHINE SULFATE (PF) 4 MG/ML IV SOLN: 4.0000 mg | INTRAVENOUS | Status: DC | PRN

## 2022-01-03 NOTE — Progress Notes (Signed)
  Transition of Care [facility]) Screening Note   Patient Details  Name: Kyle Giles Date of Birth: [birth year]   Transition of Care [facility]) CM/SW Contact:    Ihor Gully, LCSW Phone Number: 01/03/2022, 12:35 PM    Transition of Care Department [facility]) has reviewed patient and no TOC needs have been identified at this time. We will continue to monitor patient advancement through interdisciplinary progression rounds. If new patient transition needs arise, please place a TOC consult.

## 2022-01-03 NOTE — Progress Notes (Signed)
PROGRESS NOTE  Kyle Giles WFU:[id number] DOB: 1960-04-29 DOA: 01/02/2022 PCP: Sharilyn Sites, MD  Brief History:  61 year old male with a history of chronic pancreatitis, anxiety, hypertension, diabetes mellitus type 2 presenting with epigastric pain that began on 12/29/2021.  It has progressively worsened with some nausea.  He denies any emesis or diarrhea.  He denies any alcohol use.  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  He states that he takes 2 ibuprofen on a daily basis since October 2023.  He denies any other new medications.  He went to see his PCP on 01/01/2022.  Routine blood work was obtained and showed an elevated lipase and elevated serum creatinine.  As result, the patient was directed to go to the emergency department for further evaluation and treatment. In the ED, the patient was afebrile and hemodynamically stable with oxygen saturation 97% room air.  WBC 9.2, hemoglobin 10.4, platelets 291,000.  Lipase 34, AST 23, ALT 21, alk phosphatase 37, total bilirubin 0.8.  Serum creatinine was 3.60.  CT of the abdomen and pelvis showed a new 5.7 cm cystic lesion in the tail of the pancreas.  There is peripancreatic fluid extending from the pseudocyst to the lesser curvature of the stomach.  There is mild prominence of the pancreatic duct.  The patient was started on IV opioids, IV fluids, and placed on bowel rest.  GI was consulted to assist with management.  Assessment/Plan: Acute on chronic pancreatitis -Continue IV opioids -Continue IV fluids -01/02/2022 CT abdomen as above -Start clear liquids -GI consult  Pancreatic pseudocyst -GI consult -noted on 01/02/22 CT  Acute kidney injury -Baseline creatinine 0.8-1.1 -Presented with serum creatinine 3.60 -Secondary to volume depletion in the setting of olmesartan HCTZ -Discontinue olmesartan HCTZ - continue IV fluids   Anxiety -Restart lower dose alprazolam -PDMP reviewed-patient received alprazolam 1 mg, #270 every  3 months  Essential hypertension -Holding olmesartan HCTZ -Holding diltiazem for soft blood pressure  GERD -continue pantoprazole  Mixed hyperlipidemia -continue statin        Family Communication:   no Family at bedside  Consultants:  GI  Code Status:  FULL   DVT Prophylaxis:  [place] Heparin   Procedures: As Listed in Progress Note Above  Antibiotics: None      Subjective: Patient states that his abdominal pain is 50% better.  He denies any vomiting but has some nausea.  He denies any fevers, chills, chest pain, hemoptysis, shortness breath, diarrhea, hematochezia, melena.  There is no hematemesis.  Objective: Vitals:   01/02/22 2030 01/02/22 2243 01/03/22 0531 01/03/22 0753  BP: (!) 120/53 (!) 137/54 (!) 103/52 127/70  Pulse: 70 70 73 90  Resp:  _0 Temp:  (!) 97.5 F (36.4 C) 98.7 F (37.1 C) 98.8 F (37.1 C)  TempSrc:  Oral  Oral  SpO2: 95% 98% 97% 94%  Weight:  52.1 kg    Height:  _1  (1.575 m)      Intake/Output Summary (Last 24 hours) at 01/03/2022 1115 Last data filed at 01/03/2022 0800 Gross per 24 hour  Intake 882.63 ml  Output --  Net 882.63 ml   Weight change:  Exam:  General:  Pt is alert, follows commands appropriately, not in acute distress HEENT: No icterus, No thrush, No neck mass, [place]/AT Cardiovascular: RRR, S1/S2, no rubs, no gallops Respiratory: Diminished breath sounds bilateral.  Bibasilar rales.  No wheezing. Abdomen: Soft/+BS, non tender, non distended, no  guarding Extremities: No edema, No lymphangitis, No petechiae, No rashes, no synovitis   Data Reviewed: I have personally reviewed following labs and imaging studies Basic Metabolic Panel: Recent Labs  Lab 01/02/22 1900 01/03/22 0402  NA 131* 139  K 4.8 4.2  CL 99 107  CO2 20* 24  GLUCOSE 85 111*  BUN 81* 69*  CREATININE 3.60* 2.86*  CALCIUM 9.3 9.0   Liver Function Tests: Recent Labs  Lab 01/02/22 1900 01/03/22 0402  AST 23 20  ALT 21 17   ALKPHOS 37* 35*  BILITOT 0.8 0.3  PROT 8.3* 6.9  ALBUMIN 4.3 3.6   Recent Labs  Lab 01/02/22 1900  LIPASE 304*   No results for input(s): "AMMONIA" in the last 168 hours. Coagulation Profile: No results for input(s): "INR", "PROTIME" in the last 168 hours. CBC: Recent Labs  Lab 01/02/22 1900 01/03/22 0402  WBC 10.7* 9.2  NEUTROABS 7.4  --   HGB 10.7* 10.4*  HCT 33.1* 32.1*  MCV 94.8 95.5  PLT 313 291   Cardiac Enzymes: No results for input(s): "CKTOTAL", "CKMB", "CKMBINDEX", "TROPONINI" in the last 168 hours. BNP: Invalid input(s): "POCBNP" CBG: Recent Labs  Lab 01/02/22 2346 01/03/22 0748  GLUCAP 85 91   HbA1C: No results for input(s): "HGBA1C" in the last 72 hours. Urine analysis:    Component Value Date/Time   COLORURINE YELLOW 01/02/2022 1900   APPEARANCEUR CLEAR 01/02/2022 1900   LABSPEC 1.010 01/02/2022 1900   PHURINE 5.0 01/02/2022 1900   GLUCOSEU NEGATIVE 01/02/2022 1900   HGBUR NEGATIVE 01/02/2022 1900   BILIRUBINUR NEGATIVE 01/02/2022 1900   KETONESUR NEGATIVE 01/02/2022 1900   PROTEINUR NEGATIVE 01/02/2022 1900   NITRITE NEGATIVE 01/02/2022 1900   LEUKOCYTESUR NEGATIVE 01/02/2022 1900   Sepsis Labs: _0 (procalcitonin:4,lacticidven:4) )No results found for this or any previous visit (from the past 240 hour(s)).   Scheduled Meds:  heparin  5,000 Units Subcutaneous Q8H   pantoprazole (PROTONIX) IV  40 mg Intravenous Q24H   Continuous Infusions:  dextrose 5 % and 0.9% NaCl 125 mL/hr at 01/03/22 0321    Procedures/Studies: CT ABDOMEN PELVIS WO CONTRAST  Result Date: 01/02/2022 CLINICAL DATA:  Abdominal pain with elevated lipase EXAM: CT ABDOMEN AND PELVIS WITHOUT CONTRAST TECHNIQUE: Multidetector CT imaging of the abdomen and pelvis was performed following the standard protocol without IV contrast. RADIATION DOSE REDUCTION: This exam was performed according to the departmental dose-optimization program which includes automated  exposure control, adjustment of the mA and/or kV according to patient size and/or use of iterative reconstruction technique. COMPARISON:  CT abdomen and pelvis 04/21/2021 FINDINGS: Lower chest: No acute abnormality. Hepatobiliary: No focal liver abnormality is seen. No gallstones, gallbladder wall thickening, or biliary dilatation. Pancreas: Since 04/21/2021 there is a new 5.7 cm cystic lesion in the tail of the pancreas likely a pseudocyst. Peripancreatic fluid is again seen. Peripancreatic fluid and stranding extend from the pseudocyst to the lesser curvature of the stomach which is indistinct and hazy. No free intraperitoneal air to suggest perforation. Mild prominence of the main pancreatic duct increased from prior now measuring approximally 5 mm. There are coarse calcifications in the pancreatic head. Spleen: Normal in size without focal abnormality. Adrenals/Urinary Tract: Adrenal glands are unremarkable. Kidneys are normal, without renal calculi, focal lesion, or hydronephrosis. Bladder is unremarkable. Stomach/Bowel: Indistinctness of the wall of the lesser curvature of the stomach which appears confluence with the peripancreatic stranding. Normal caliber large and small bowel. Normal appendix. Extensive colonic diverticulosis without diverticulitis. Vascular/Lymphatic: Aortic atherosclerosis. No  enlarged abdominal or pelvic lymph nodes. Reproductive: Prostate is unremarkable. Other: No free intraperitoneal air. Musculoskeletal: No acute osseous abnormality. IMPRESSION: Acute on chronic pancreatitis. There is a pseudocyst within the tail of the pancreas and acute peripancreatic inflammatory fluid extending between the pancreas and lesser curvature of the stomach. The lesser curvature of the stomach wall is indistinct near the gastric antrum and confluence with the peripancreatic fluid. This is likely due to reactive edema. No free intraperitoneal air to suggest perforation. Colonic diverticulosis without  diverticulitis. Electronically Signed   By: Placido Sou M.D.   On: 01/02/2022 20:17    Orson Eva, DO  [facility]  If 7PM-7AM, please contact night-coverage [email or web address] Password [email or web address] 01/03/2022, 11:15 AM   LOS: 0 days

## 2022-01-03 NOTE — Consult Note (Signed)
Gastroenterology Consult   Referring Provider: No ref. provider found Primary Care Physician:  Sharilyn Sites, MD Primary Gastroenterologist:  Elon Alas. Abbey Chatters, DO  Patient ID: Kyle Giles; [id number]; May 12, 1960   Admit date: 01/02/2022  LOS: 0 days   Date of Consultation: 01/03/2022  Reason for Consultation:  acute on chronic pancreatitis    History of Present Illness   Kyle Giles is a 61 y.o. male with history of chronic pancreatitis, hepatic steatosis, anxiety, HTN, DM presenting to the ED with four day history of abdominal pain. Was seen by PCP as outpatient. Lipase in the 400s. AKI with creatinine around 2. He was referred to the ED.   Patient reports being sober since 04/2021. He has been doing well since April. Over the weekend, he started having epigastric pain similar to when he had pancreatitis earlier this year. No vomiting. He denies etoh. No new medication. Several months ago he took Celebrex and muscle relaxer for short period of time for back pain. Currently he takes ibuprofen 454m most days for back pain which he relates to his new job which is more physical. He denies heartburn. Appetite has been good before this weekend. BMs regular. No melena, brbpr. He has been able to slowly gain back some weight since 04/2021 although not at his baseline. He prefers his current weight.     In the ED: White blood cell count 10,700, hemoglobin 10.7 (11 in April 2023), sodium 131, potassium 4.8, BUN 81, creatinine 3.60 (1.20 July 2021), albumin 4.3, total bilirubin 0.8, alkaline phosphatase 37, AST 23, ALT 21.  Lipase 304.  Calcium 9.3.  Today creatinine down to 2.86, LFTs remain normal, hemoglobin stable at 10.4, white blood cell count 9200, triglycerides 116.  CT abdomen pelvis with contrast yesterday: 5.7 cm cystic lesion in the tail of the pancreas likely pseudocyst, new since April 21, 2021 exam.  Peripancreatic fluid again noted.  Acute peripancreatic inflammatory fluid  extending between the pancreas and the lesser curvature of the stomach.  Mild prominence of the main pancreatic duct increased from prior now measuring 5 mm.  Coarse calcifications in the pancreatic head.  EGD 08/2021: normal   Colonoscopy 12/2019: diverticulosis, hyperplastic polyp  Prior to Admission medications   Medication Sig Start Date End Date Taking? Authorizing Provider  ALPRAZolam (Duanne Moron 1 MG tablet Take 1 tablet by mouth three times daily. Patient taking differently: Take 1 mg by mouth 3 (three) times daily as needed for anxiety. 05/18/21  Yes   cholecalciferol (VITAMIN D3) 25 MCG (1000 UNIT) tablet Take 1,000 Units by mouth 2 (two) times daily.   Yes [provider]  diltiazem (CARDIZEM CD) 360 MG 24 hr capsule Take 1 capsule (360 mg total) by mouth every evening. 07/23/21  Yes   esomeprazole (NEXIUM) 20 MG capsule Take 20 mg by mouth in the morning.   Yes [provider]  fenofibrate 160 MG tablet Take 1 tablet by mouth daily 04/27/21  Yes   ibuprofen (ADVIL) 200 MG tablet Take 400 mg by mouth every 6 (six) hours as needed for moderate pain.   Yes [provider]  metFORMIN (GLUCOPHAGE) 500 MG tablet Take 250 mg by mouth 2 (two) times daily with a meal.   Yes [provider]  olmesartan-hydrochlorothiazide (BENICAR HCT) 40-25 MG tablet Take 1 tablet by mouth once a day 08/09/21  Yes   Propylene Glycol (SYSTANE COMPLETE) 0.6 % SOLN Place 1 drop into both eyes daily.   Yes [provider]  simvastatin (ZOCOR) 20 MG tablet TAKE 1 TABLET BY MOUTH ONCE DAILY. Patient taking differently: Take 20 mg by mouth every evening. 07/23/21  Yes   traMADol (ULTRAM) 50 MG tablet Take 100 mg by mouth every 6 (six) hours as needed for moderate pain. 01/01/22  Yes [provider]  ALPRAZolam Duanne Moron) 1 MG tablet Take 1 tablet by mouth 3 times daily. Patient not taking: Reported on 01/02/2022 08/15/21     ALPRAZolam (XANAX) 1 MG tablet Take 1 tablet (1 mg  total) by mouth 3 (three) times daily. Patient not taking: Reported on 01/02/2022 11/14/21     cetirizine (ZYRTEC) 10 MG tablet TAKE (1) TABLET BY MOUTH ONCE DAILY. Patient not taking: Reported on 01/02/2022 08/09/21     metFORMIN (GLUCOPHAGE) 500 MG tablet Take 1/2 tablet by mouth twice daily. 09/10/21       Current Facility-Administered Medications  Medication Dose Route Frequency Provider Last Rate Last Admin   acetaminophen (TYLENOL) tablet 650 mg  650 mg Oral Q6H PRN Emokpae, Ejiroghene E, MD       Or   acetaminophen (TYLENOL) suppository 650 mg  650 mg Rectal Q6H PRN Emokpae, Ejiroghene E, MD       ALPRAZolam (XANAX) tablet 0.5 mg  0.5 mg Oral TID PRN Tat, Shanon Brow, MD       heparin injection 5,000 Units  5,000 Units Subcutaneous Q8H Emokpae, Ejiroghene E, MD   5,000 Units at 01/03/22 1610   lactated ringers infusion   Intravenous Continuous Tat, David, MD 150 mL/hr at 01/03/22 1153 New Bag at 01/03/22 1153   morphine (PF) 4 MG/ML injection 4 mg  4 mg Intravenous Q3H PRN Tat, Shanon Brow, MD       ondansetron (ZOFRAN) tablet 4 mg  4 mg Oral Q8H PRN Emokpae, Ejiroghene E, MD       Or   ondansetron (ZOFRAN) injection 4 mg  4 mg Intravenous Q8H PRN Emokpae, Ejiroghene E, MD       oxyCODONE (Oxy IR/ROXICODONE) immediate release tablet 5 mg  5 mg Oral Q6H PRN Tat, Shanon Brow, MD   5 mg at 01/03/22 1225   pantoprazole (PROTONIX) injection 40 mg  40 mg Intravenous Q24H Emokpae, Ejiroghene E, MD   40 mg at 01/02/22 2345   polyethylene glycol (MIRALAX / GLYCOLAX) packet 17 g  17 g Oral Daily PRN Emokpae, Ejiroghene E, MD       simvastatin (ZOCOR) tablet 20 mg  20 mg Oral q1800 Tat, Shanon Brow, MD        Allergies as of 01/02/2022   (No Known Allergies)    Past Medical History:  Diagnosis Date   Anxiety    DM type 2 (diabetes mellitus, type 2) (HCC)    GERD (gastroesophageal reflux disease)    Heart murmur    saw dr Willeen Cass 06-29-2019 echo done    Hypercholesterolemia    Hypertension    Palpitations     none in a long time as of 10-19-2019   Pancreatitis    S/P colonoscopy 09/2009   Dr. Arnoldo Morale: Moderate diverticulosis throughout colon, otherwise normal    Umbilical hernia     Past Surgical History:  Procedure Laterality Date   COLONOSCOPY N/A 12/21/2019   Procedure: COLONOSCOPY;  Surgeon: Aviva Signs, MD;  [facility];  Service: Gastroenterology;  Laterality: N/A;   cyst removed from finger  age 49   ESOPHAGOGASTRODUODENOSCOPY (EGD) WITH PROPOFOL N/A 08/13/2021   Procedure: ESOPHAGOGASTRODUODENOSCOPY (EGD) WITH PROPOFOL;  Surgeon: Eloise Harman,  DO;  Location: [facility];  Service: Endoscopy;  Laterality: N/A;  1:30pm   HERNIA REPAIR     as baby   POLYPECTOMY  12/21/2019   Procedure: POLYPECTOMY;  Surgeon: Aviva Signs, MD;  Location: [facility];  Service: Gastroenterology;;   pyloric stenosis repair     as baby, operated at [place] 10/29/2019   Procedure: [place];  Surgeon: Clovis Riley, MD;  Location: [facility];  Service: General;  Laterality: N/A;    Family History  Problem Relation Age of Onset   Colon cancer Neg Hx     Social History   Socioeconomic History   Marital status: Married    Spouse name: Not on file   Number of children: 2   Years of education: Not on file   Highest education level: Not on file  Occupational History   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[employer]    Comment: [employer]  Tobacco Use   Smoking status: Every Day    Packs/day: 1.50    Years: 30.00    Total pack years: 45.00    Types: Cigarettes    Passive exposure: Current   Smokeless tobacco: Never   Tobacco comments:    1 ppd since 3 weeks as of 10-19-2019  Vaping Use   Vaping Use: Never used  Substance and Sexual Activity   Alcohol use: Not Currently    Comment: quit 05/30/21   Drug use: Not Currently   Sexual activity: Not on file  Other Topics Concern    Not on file  Social History Narrative   Not on file   Social Determinants of Health   Financial Resource Strain: Not on file  Food Insecurity: No Food Insecurity (01/02/2022)   Hunger Vital Sign    Worried About Running Out of Food in the Last Year: Never true    [place] in the Last Year: Never true  Transportation Needs: No Transportation Needs (01/02/2022)   PRAPARE - Hydrologist (Medical): No    Lack of Transportation (Non-Medical): No  Physical Activity: Not on file  Stress: Not on file  Social Connections: Not on file  Intimate Partner Violence: Not At Risk (01/02/2022)   Humiliation, Afraid, Rape, and Kick questionnaire    Fear of Current or Ex-Partner: No    Emotionally Abused: No    Physically Abused: No    Sexually Abused: No     Review of System:   General: Negative for anorexia, weight loss, fever, chills, fatigue, weakness. Eyes: Negative for vision changes.  ENT: Negative for hoarseness, difficulty swallowing , nasal congestion. CV: Negative for chest pain, angina, palpitations, dyspnea on exertion, peripheral edema.  Respiratory: Negative for dyspnea at rest, dyspnea on exertion, cough, sputum, wheezing.  GI: See history of present illness. GU:  Negative for dysuria, hematuria, urinary incontinence, urinary frequency, nocturnal urination.  MS: Negative for joint pain. + low back pain.  Derm: Negative for rash or itching.  Neuro: Negative for weakness, abnormal sensation, seizure, frequent headaches, memory loss, confusion.  Psych: Negative for anxiety, depression, suicidal ideation, hallucinations.  Endo: Negative for unusual weight change.  Heme: Negative for bruising or bleeding. Allergy: Negative for rash or hives.      Physical Examination:   Vital signs in last 24 hours: Temp:  [97.5 F (36.4 C)-99.1 F (37.3 C)] 98.8 F (37.1 C) (11/30  3491) Pulse Rate:  [67-90] 90 (11/30 0753) Resp:  [14-20] 17 (11/30  0753) BP: (103-137)/(47-70) 127/70 (11/30 0753) SpO2:  [92 %-99 %] 94 % (11/30 0753) Weight:  [52.1 kg-52.2 kg] 52.1 kg (11/29 2243)    General: Well-nourished, well-developed in no acute distress.  Head: Normocephalic, atraumatic.   Eyes: Conjunctiva pink, no icterus. Mouth: Oropharyngeal mucosa moist and pink , no lesions erythema or exudate. Neck: Supple without thyromegaly, masses, or lymphadenopathy.  Lungs: Clear to auscultation bilaterally.  Heart: Regular rate and rhythm, no murmurs rubs or gallops.  Abdomen: Bowel sounds are normal,  nondistended, no hepatosplenomegaly or masses, no abdominal bruits or hernia , no rebound or guarding.  Moderate epigastric tenderness. Rectal: not performed Extremities: No lower extremity edema, clubbing, deformity.  Neuro: Alert and oriented x 4 , grossly normal neurologically.  Skin: Warm and dry, no rash or jaundice.   Psych: Alert and cooperative, normal mood and affect.        Intake/Output from previous day: 11/29 0701 - 11/30 0700 In: 520.3 [I.V.:520.3] Out: -  Intake/Output this shift: Total I/O In: 362.4 [I.V.:362.4] Out: -   Lab Results:   CBC Recent Labs    01/02/22 1900 01/03/22 0402  WBC 10.7* 9.2  HGB 10.7* 10.4*  HCT 33.1* 32.1*  MCV 94.8 95.5  PLT 313 291   BMET Recent Labs    01/02/22 1900 01/03/22 0402  NA 131* 139  K 4.8 4.2  CL 99 107  CO2 20* 24  GLUCOSE 85 111*  BUN 81* 69*  CREATININE 3.60* 2.86*  CALCIUM 9.3 9.0   LFT Recent Labs    01/02/22 1900 01/03/22 0402  BILITOT 0.8 0.3  ALKPHOS 37* 35*  AST 23 20  ALT 21 17  PROT 8.3* 6.9  ALBUMIN 4.3 3.6    Lipase Recent Labs    01/02/22 1900  LIPASE 304*    PT/INR No results for input(s): "LABPROT", "INR" in the last 72 hours.   Hepatitis Panel No results for input(s): "HEPBSAG", "HCVAB", "HEPAIGM", "HEPBIGM" in the last 72 hours.   Imaging Studies:   CT ABDOMEN PELVIS WO CONTRAST  Result Date: 01/02/2022 CLINICAL DATA:   Abdominal pain with elevated lipase EXAM: CT ABDOMEN AND PELVIS WITHOUT CONTRAST TECHNIQUE: Multidetector CT imaging of the abdomen and pelvis was performed following the standard protocol without IV contrast. RADIATION DOSE REDUCTION: This exam was performed according to the departmental dose-optimization program which includes automated exposure control, adjustment of the mA and/or kV according to patient size and/or use of iterative reconstruction technique. COMPARISON:  CT abdomen and pelvis 04/21/2021 FINDINGS: Lower chest: No acute abnormality. Hepatobiliary: No focal liver abnormality is seen. No gallstones, gallbladder wall thickening, or biliary dilatation. Pancreas: Since 04/21/2021 there is a new 5.7 cm cystic lesion in the tail of the pancreas likely a pseudocyst. Peripancreatic fluid is again seen. Peripancreatic fluid and stranding extend from the pseudocyst to the lesser curvature of the stomach which is indistinct and hazy. No free intraperitoneal air to suggest perforation. Mild prominence of the main pancreatic duct increased from prior now measuring approximally 5 mm. There are coarse calcifications in the pancreatic head. Spleen: Normal in size without focal abnormality. Adrenals/Urinary Tract: Adrenal glands are unremarkable. Kidneys are normal, without renal calculi, focal lesion, or hydronephrosis. Bladder is unremarkable. Stomach/Bowel: Indistinctness of the wall of the lesser curvature of the stomach which appears confluence with the peripancreatic stranding. Normal caliber large and small bowel. Normal appendix. Extensive colonic diverticulosis without diverticulitis. Vascular/Lymphatic:  Aortic atherosclerosis. No enlarged abdominal or pelvic lymph nodes. Reproductive: Prostate is unremarkable. Other: No free intraperitoneal air. Musculoskeletal: No acute osseous abnormality. IMPRESSION: Acute on chronic pancreatitis. There is a pseudocyst within the tail of the pancreas and acute  peripancreatic inflammatory fluid extending between the pancreas and lesser curvature of the stomach. The lesser curvature of the stomach wall is indistinct near the gastric antrum and confluence with the peripancreatic fluid. This is likely due to reactive edema. No free intraperitoneal air to suggest perforation. Colonic diverticulosis without diverticulitis. Electronically Signed   By: Placido Sou M.D.   On: 01/02/2022 20:17  [4 week]  Assessment:   61 year old male with history of chronic pancreatitis by CT, anxiety, hypertension, diabetes presenting with 4-day history of abdominal pain, AKI.  GI consulted for acute on chronic pancreatitis.  Acute on chronic pancreatitis: Patient hospitalized for pancreatitis back in March.  Noted to have calcifications within the pancreas at that time consistent with chronic pancreatitis.  No alcohol since March 2023.  Had minor setback in April 2023 with recurrent epigastric pain although milder.  Completed EGD July 2023 to evaluate duodenitis seen on CT, EGD was unremarkable.    Had been doing well up until 4 days ago.  Developed recurrent epigastric pain.  Denies any recent medication changes.  Gallbladder remains in situ.  Calcium and triglyceride levels are normal.  Current imaging with 5.7 cm cystic lesion in the tail of the pancreas likely a pseudocyst, new from March 2023 study.  Also with acute peripancreatic inflammatory fluid extending between the pancreas and lesser curvature of the stomach, chronic pancreatitis changes with calcifications noted.  Also noted to have mild prominence of the main pancreatic duct increased from prior study now measuring 5 mm.  Cystic lesion in the pancreatic tail suspected to be pseudocyst, could have resulted from previous episode of acute pancreatitis.  Not likely source of his current pain.  Can consider further imaging at a later date after acute symptoms resolved to further evaluate main pancreatic duct prominence and  cystic lesion.  For now would recommend supportive measures, consider pancreatic enzymes once start solid food.  Plan:   Supportive measures.  Adequate hydration and pain management. Limited pain medication use since admission, if he continues to do well today, consider advancing diet tomorrow. Continue complete alcohol cessation. Consider imaging in 6 to 8 weeks to further evaluate main pancreatic duct prominence, cystic lesion at tail of pancreas, evaluate for cholelithiasis as a potential source of recurrent pancreatitis.   LOS: 0 days   We would like to thank you for the opportunity to participate in the care of SAWYER MENTZER.  Laureen Ochs. Bernarda Caffey [facility] [phone] 11/30/202312:29 PM

## 2022-01-04 ENCOUNTER — Telehealth: Payer: Self-pay | Admitting: Gastroenterology

## 2022-01-04 DIAGNOSIS — I10 Essential (primary) hypertension: Secondary | ICD-10-CM | POA: Diagnosis not present

## 2022-01-04 DIAGNOSIS — N179 Acute kidney failure, unspecified: Secondary | ICD-10-CM | POA: Diagnosis not present

## 2022-01-04 DIAGNOSIS — R1013 Epigastric pain: Secondary | ICD-10-CM

## 2022-01-04 DIAGNOSIS — K861 Other chronic pancreatitis: Secondary | ICD-10-CM | POA: Diagnosis not present

## 2022-01-04 DIAGNOSIS — K859 Acute pancreatitis without necrosis or infection, unspecified: Secondary | ICD-10-CM | POA: Diagnosis not present

## 2022-01-04 LAB — GLUCOSE, CAPILLARY
Glucose-Capillary: 110 mg/dL — ABNORMAL HIGH (ref 70–99)
Glucose-Capillary: 85 mg/dL (ref 70–99)
Glucose-Capillary: 90 mg/dL (ref 70–99)

## 2022-01-04 LAB — COMPREHENSIVE METABOLIC PANEL
ALT: 17 U/L (ref 0–44)
AST: 20 U/L (ref 15–41)
Albumin: 3 g/dL — ABNORMAL LOW (ref 3.5–5.0)
Alkaline Phosphatase: 30 U/L — ABNORMAL LOW (ref 38–126)
Anion gap: 7 (ref 5–15)
BUN: 32 mg/dL — ABNORMAL HIGH (ref 8–23)
CO2: 23 mmol/L (ref 22–32)
Calcium: 9.1 mg/dL (ref 8.9–10.3)
Chloride: 111 mmol/L (ref 98–111)
Creatinine, Ser: 1.45 mg/dL — ABNORMAL HIGH (ref 0.61–1.24)
GFR, Estimated: 54 mL/min — ABNORMAL LOW (ref >60)
Glucose, Bld: 77 mg/dL (ref 70–99)
Potassium: 4.4 mmol/L (ref 3.5–5.1)
Sodium: 141 mmol/L (ref 135–145)
Total Bilirubin: 0.5 mg/dL (ref 0.3–1.2)
Total Protein: 6 g/dL — ABNORMAL LOW (ref 6.5–8.1)

## 2022-01-04 LAB — MAGNESIUM: Magnesium: 1.9 mg/dL (ref 1.7–2.4)

## 2022-01-04 LAB — HEMOGLOBIN A1C
Hgb A1c MFr Bld: 6.8 % — ABNORMAL HIGH (ref 4.8–5.6)
Mean Plasma Glucose: 148 mg/dL

## 2022-01-04 NOTE — Telephone Encounter (Signed)
Please make patient a hospital follow up in about 4 weeks with myself, Dr. Abbey Chatters, or LSL.   Venetia Night, MSN, APRN, FNP-BC, AGACNP-BC [facility]

## 2022-01-04 NOTE — Progress Notes (Signed)
Patient did not sleep very well throughout this shift, he did say he could not get comfortable. This morning he complained of anxiousness and pain  8 out of 10, PRN pain meds and anxiety meds given. No additional needs expressed at this time.

## 2022-01-04 NOTE — Progress Notes (Signed)
Gastroenterology Progress Note   Referring Provider: No ref. provider found Primary Care Physician:  Sharilyn Sites, MD Primary Gastroenterologist: Elon Alas. Abbey Chatters, DO  Patient ID: Kyle Giles; [id number]; 11/04/1960    Subjective   Denies any abdominal pain, nausea, vomiting, or diarrhea.  No bowel movement since admission. + flatus.  Tolerating clear liquid diet.  Patient does have many questions regarding ongoing treatment, follow-up, and possible discharge.   Objective   Vital signs in last 24 hours Temp:  [98.2 F (36.8 C)-98.8 F (37.1 C)] 98.2 F (36.8 C) (12/01 0511) Pulse Rate:  [68-107] 107 (12/01 0511) Resp:  [16-18] 18 (12/01 0511) BP: (120-145)/(68-80) 143/76 (12/01 0511) SpO2:  [96 %-98 %] 96 % (12/01 0511)    Physical Exam General:   Alert and oriented, pleasant Head:  Normocephalic and atraumatic. Eyes:  No icterus, sclera clear. Conjuctiva pink.  Mouth:  Without lesions, mucosa pink and moist.  Neck:  Supple, without thyromegaly or masses.  Abdomen:  Bowel sounds present, soft, non-tender, non-distended. No HSM or hernias noted. No rebound or guarding. No masses appreciated  Extremities:  Without clubbing or edema. Neurologic:  Alert and  oriented x4;  grossly normal neurologically. Psych:  Alert and cooperative. Normal mood and affect.  Intake/Output from previous day: 11/30 0701 - 12/01 0700 In: 3353.7 [P.O.:720; I.V.:2633.7] Out: -  Intake/Output this shift: No intake/output data recorded.  Lab Results  Recent Labs    01/02/22 1900 01/03/22 0402  WBC 10.7* 9.2  HGB 10.7* 10.4*  HCT 33.1* 32.1*  PLT 313 291   BMET Recent Labs    01/02/22 1900 01/03/22 0402 01/04/22 0423  NA 131* 139 141  K 4.8 4.2 4.4  CL 99 107 111  CO2 20* 24 23  GLUCOSE 85 111* 77  BUN 81* 69* 32*  CREATININE 3.60* 2.86* 1.45*  CALCIUM 9.3 9.0 9.1   LFT Recent Labs    01/02/22 1900 01/03/22 0402 01/04/22 0423  PROT 8.3* 6.9 6.0*  ALBUMIN 4.3  3.6 3.0*  AST 23 20 20   ALT 21 17 17   ALKPHOS 37* 35* 30*  BILITOT 0.8 0.3 0.5   PT/INR No results for input(s): "LABPROT", "INR" in the last 72 hours. Hepatitis Panel No results for input(s): "HEPBSAG", "HCVAB", "HEPAIGM", "HEPBIGM" in the last 72 hours.   Studies/Results CT ABDOMEN PELVIS WO CONTRAST  Result Date: 01/02/2022 CLINICAL DATA:  Abdominal pain with elevated lipase EXAM: CT ABDOMEN AND PELVIS WITHOUT CONTRAST TECHNIQUE: Multidetector CT imaging of the abdomen and pelvis was performed following the standard protocol without IV contrast. RADIATION DOSE REDUCTION: This exam was performed according to the departmental dose-optimization program which includes automated exposure control, adjustment of the mA and/or kV according to patient size and/or use of iterative reconstruction technique. COMPARISON:  CT abdomen and pelvis 04/21/2021 FINDINGS: Lower chest: No acute abnormality. Hepatobiliary: No focal liver abnormality is seen. No gallstones, gallbladder wall thickening, or biliary dilatation. Pancreas: Since 04/21/2021 there is a new 5.7 cm cystic lesion in the tail of the pancreas likely a pseudocyst. Peripancreatic fluid is again seen. Peripancreatic fluid and stranding extend from the pseudocyst to the lesser curvature of the stomach which is indistinct and hazy. No free intraperitoneal air to suggest perforation. Mild prominence of the main pancreatic duct increased from prior now measuring approximally 5 mm. There are coarse calcifications in the pancreatic head. Spleen: Normal in size without focal abnormality. Adrenals/Urinary Tract: Adrenal glands are unremarkable. Kidneys are normal, without renal calculi, focal lesion,  or hydronephrosis. Bladder is unremarkable. Stomach/Bowel: Indistinctness of the wall of the lesser curvature of the stomach which appears confluence with the peripancreatic stranding. Normal caliber large and small bowel. Normal appendix. Extensive colonic  diverticulosis without diverticulitis. Vascular/Lymphatic: Aortic atherosclerosis. No enlarged abdominal or pelvic lymph nodes. Reproductive: Prostate is unremarkable. Other: No free intraperitoneal air. Musculoskeletal: No acute osseous abnormality. IMPRESSION: Acute on chronic pancreatitis. There is a pseudocyst within the tail of the pancreas and acute peripancreatic inflammatory fluid extending between the pancreas and lesser curvature of the stomach. The lesser curvature of the stomach wall is indistinct near the gastric antrum and confluence with the peripancreatic fluid. This is likely due to reactive edema. No free intraperitoneal air to suggest perforation. Colonic diverticulosis without diverticulitis. Electronically Signed   By: Placido Sou M.D.   On: 01/02/2022 20:17    Assessment  61 y.o. male with a history of chronic pancreatitis by CT anxiety, HTN, diabetes presenting with 4-day history of abdominal pain and AKI.  Check for acute on chronic hepatitis.  Acute on Chronic pancreatitis: Patient hospitalized in March for pancreatitis and was noted to have calcifications of the pancreas at that time consistent with chronic pericarditis.  Denies any alcohol use since March.  Had recurrent epigastric pain in April 2023.  EGD completed in July 2023 to evaluate duodenitis seen on prior CT.  EGD was unremarkable.  Doing well until a few days prior to admission.  No recent medication changes.  Gallbladder remains.  Prior workup with normal triglycerides and calcium.  Current CT imaging with 5.7 cm cystic lesion in the tail of the pancreas, likely pseudocyst.  This is new from prior study in March.  Also with new acute peripancreatic inflammatory fluid extending between the pancreas and stomach, chronic pancreatic changes with calcifications noted.  Also with mild prominence of main pancreatic duct increased from prior study (5m).   Cystic lesion in the pancreatic tail suspected to be pseudocyst. Will  need follow-up dedicated pancreatic CT in 6-8 weeks to evaluate pancreatic duct and cystic lesion further.  Given is tolerating diet, will advance slowly today to fulls.  Discussed in detail with patient today regarding starting pancreatic enzymes given chronic pancreatitis.  Given his current weight, he would benefit from 48000 units with meals and 24,000 units with snacks depending on available formulation.  If able to tolerate full liquid diet, may decrease IV fluids given improvement in creatinine.   Plan / Recommendations  Supportive measures Advance diet to full liquid diet. If tolerating full liquids could reduce fluids to 100 cc/hr.  Would likely benefit from pancreatic enzyme replacement once beginning solids - Creon 48000 units with meals and 24000 units with snacks.  Continue alcohol cessation Repeat pancreatic imaging in 6-8 weeks. (Further evaluate pancreatic duct, cystic lesion, and evaluate for cholelithiasis.  Follow up outpatient in about 4 weeks.     LOS: 1 day    01/04/2022, 9:13 AM   CVenetia Night MSN, FNP-BC, AGACNP-BC [facility]

## 2022-01-04 NOTE — Progress Notes (Signed)
PROGRESS NOTE  Kyle Giles HDQ:[id number] DOB: [birth year] DOA: 01/02/2022 PCP: Sharilyn Sites, MD  Brief History:  61 year old male with a history of chronic pancreatitis, anxiety, hypertension, diabetes mellitus type 2 presenting with epigastric pain that began on 12/29/2021.  It has progressively worsened with some nausea.  He denies any emesis or diarrhea.  He denies any alcohol use.  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  He states that he takes 2 ibuprofen on a daily basis since October 2023.  He denies any other new medications.  He went to see his PCP on 01/01/2022.  Routine blood work was obtained and showed an elevated lipase and elevated serum creatinine.  As result, the patient was directed to go to the emergency department for further evaluation and treatment. In the ED, the patient was afebrile and hemodynamically stable with oxygen saturation 97% room air.  WBC 9.2, hemoglobin 10.4, platelets 291,000.  Lipase 34, AST 23, ALT 21, alk phosphatase 37, total bilirubin 0.8.  Serum creatinine was 3.60.  CT of the abdomen and pelvis showed a new 5.7 cm cystic lesion in the tail of the pancreas.  There is peripancreatic fluid extending from the pseudocyst to the lesser curvature of the stomach.  There is mild prominence of the pancreatic duct.  The patient was started on IV opioids, IV fluids, and placed on bowel rest.  GI was consulted to assist with management.   Assessment/Plan: Acute on chronic pancreatitis -Continue IV opioids>>po opioids as pain is improving -Continue IV fluids -01/02/2022 CT abdomen as above -Start clear liquids>>soft diet -GI consult appreciated -triglycerides 113   Pancreatic pseudocyst -GI consult appreciated -noted on 01/02/22 CT -repeat pancreatic imaging 6-8 weeks   Acute kidney injury -Baseline creatinine 0.8-1.1 -Presented with serum creatinine 3.60 -Secondary to volume depletion in the setting of olmesartan HCTZ -Discontinue olmesartan  HCTZ - continue IV fluids>>improving   Anxiety -Restart lower dose alprazolam -PDMP reviewed-patient received alprazolam 1 mg, #270 every 3 months   Essential hypertension -Holding olmesartan HCTZ -Holding diltiazem for soft blood pressure   GERD -continue pantoprazole   Mixed hyperlipidemia -continue statin  Diabetes Mellitus type 2 -01/03/22 A1C-- 6.8 - outpt follow up with PCP               Family Communication:   no Family at bedside   Consultants:  GI   Code Status:  FULL    DVT Prophylaxis:  [place] Heparin     Procedures: As Listed in Progress Note Above   Antibiotics: None            Subjective: Pt states abd pain is improving.  He wants to try solid food.  Denies f/c, cp, sob, n/v/d, hematochezia, melena  Objective: Vitals:   01/03/22 1300 01/03/22 2128 01/04/22 0511 01/04/22 1459  BP: 120/68 (!) 145/80 (!) 143/76 (!) 170/69  Pulse: 69 68 (!) 107 84  Resp: _0 Temp: 98.7 F (37.1 C) 98.8 F (37.1 C) 98.2 F (36.8 C) 99.4 F (37.4 C)  TempSrc: Oral Oral  Oral  SpO2: 96% 98% 96% 97%  Weight:      Height:        Intake/Output Summary (Last 24 hours) at 01/04/2022 1716 Last data filed at 01/04/2022 1500 Gross per 24 hour  Intake 3669.33 ml  Output --  Net 3669.33 ml   Weight change:  Exam:  General:  Pt is alert, follows commands appropriately, not  in acute distress HEENT: No icterus, No thrush, No neck mass, [place]/AT Cardiovascular: RRR, S1/S2, no rubs, no gallops Respiratory: CTA bilaterally, no wheezing, no crackles, no rhonchi Abdomen: Soft/+BS, mild epigastric tender, non distended, no guarding Extremities: No edema, No lymphangitis, No petechiae, No rashes, no synovitis   Data Reviewed: I have personally reviewed following labs and imaging studies Basic Metabolic Panel: Recent Labs  Lab 01/02/22 1900 01/03/22 0402 01/04/22 0423  NA 131* 139 141  K 4.8 4.2 4.4  CL 99 107 111  CO2 20* 24 23  GLUCOSE 85 111*  77  BUN 81* 69* 32*  CREATININE 3.60* 2.86* 1.45*  CALCIUM 9.3 9.0 9.1  MG  --   --  1.9   Liver Function Tests: Recent Labs  Lab 01/02/22 1900 01/03/22 0402 01/04/22 0423  AST _0 ALT _1 ALKPHOS 37* 35* 30*  BILITOT 0.8 0.3 0.5  PROT 8.3* 6.9 6.0*  ALBUMIN 4.3 3.6 3.0*   Recent Labs  Lab 01/02/22 1900  LIPASE 304*   No results for input(s): "AMMONIA" in the last 168 hours. Coagulation Profile: No results for input(s): "INR", "PROTIME" in the last 168 hours. CBC: Recent Labs  Lab 01/02/22 1900 01/03/22 0402  WBC 10.7* 9.2  NEUTROABS 7.4  --   HGB 10.7* 10.4*  HCT 33.1* 32.1*  MCV 94.8 95.5  PLT 313 291   Cardiac Enzymes: No results for input(s): "CKTOTAL", "CKMB", "CKMBINDEX", "TROPONINI" in the last 168 hours. BNP: Invalid input(s): "POCBNP" CBG: Recent Labs  Lab 01/02/22 2346 01/03/22 0748 01/03/22 1600 01/04/22 0022 01/04/22 0714  GLUCAP 85 91 74 85 90   HbA1C: Recent Labs    01/03/22 1200  HGBA1C 6.8*   Urine analysis:    Component Value Date/Time   COLORURINE YELLOW 01/02/2022 1900   APPEARANCEUR CLEAR 01/02/2022 1900   LABSPEC 1.010 01/02/2022 1900   PHURINE 5.0 01/02/2022 1900   GLUCOSEU NEGATIVE 01/02/2022 1900   HGBUR NEGATIVE 01/02/2022 1900   BILIRUBINUR NEGATIVE 01/02/2022 1900   KETONESUR NEGATIVE 01/02/2022 1900   PROTEINUR NEGATIVE 01/02/2022 1900   NITRITE NEGATIVE 01/02/2022 1900   LEUKOCYTESUR NEGATIVE 01/02/2022 1900   Sepsis Labs: _2 (procalcitonin:4,lacticidven:4) )No results found for this or any previous visit (from the past 240 hour(s)).   Scheduled Meds:  heparin  5,000 Units Subcutaneous Q8H   pantoprazole (PROTONIX) IV  40 mg Intravenous Q24H   simvastatin  20 mg Oral q1800   Continuous Infusions:  lactated ringers 75 mL/hr at 01/04/22 1646    Procedures/Studies: CT ABDOMEN PELVIS WO CONTRAST  Result Date: 01/02/2022 CLINICAL DATA:  Abdominal pain with elevated lipase EXAM: CT  ABDOMEN AND PELVIS WITHOUT CONTRAST TECHNIQUE: Multidetector CT imaging of the abdomen and pelvis was performed following the standard protocol without IV contrast. RADIATION DOSE REDUCTION: This exam was performed according to the departmental dose-optimization program which includes automated exposure control, adjustment of the mA and/or kV according to patient size and/or use of iterative reconstruction technique. COMPARISON:  CT abdomen and pelvis 04/21/2021 FINDINGS: Lower chest: No acute abnormality. Hepatobiliary: No focal liver abnormality is seen. No gallstones, gallbladder wall thickening, or biliary dilatation. Pancreas: Since 04/21/2021 there is a new 5.7 cm cystic lesion in the tail of the pancreas likely a pseudocyst. Peripancreatic fluid is again seen. Peripancreatic fluid and stranding extend from the pseudocyst to the lesser curvature of the stomach which is indistinct and hazy. No free intraperitoneal air to suggest perforation. Mild prominence of the main pancreatic duct  increased from prior now measuring approximally 5 mm. There are coarse calcifications in the pancreatic head. Spleen: Normal in size without focal abnormality. Adrenals/Urinary Tract: Adrenal glands are unremarkable. Kidneys are normal, without renal calculi, focal lesion, or hydronephrosis. Bladder is unremarkable. Stomach/Bowel: Indistinctness of the wall of the lesser curvature of the stomach which appears confluence with the peripancreatic stranding. Normal caliber large and small bowel. Normal appendix. Extensive colonic diverticulosis without diverticulitis. Vascular/Lymphatic: Aortic atherosclerosis. No enlarged abdominal or pelvic lymph nodes. Reproductive: Prostate is unremarkable. Other: No free intraperitoneal air. Musculoskeletal: No acute osseous abnormality. IMPRESSION: Acute on chronic pancreatitis. There is a pseudocyst within the tail of the pancreas and acute peripancreatic inflammatory fluid extending between  the pancreas and lesser curvature of the stomach. The lesser curvature of the stomach wall is indistinct near the gastric antrum and confluence with the peripancreatic fluid. This is likely due to reactive edema. No free intraperitoneal air to suggest perforation. Colonic diverticulosis without diverticulitis. Electronically Signed   By: Placido Sou M.D.   On: 01/02/2022 20:17    Orson Eva, DO  [facility]  If 7PM-7AM, please contact night-coverage [email or web address] Password [email or web address] 01/04/2022, 5:16 PM   LOS: 1 day

## 2022-01-05 ENCOUNTER — Telehealth: Payer: Self-pay | Admitting: Internal Medicine

## 2022-01-05 DIAGNOSIS — N179 Acute kidney failure, unspecified: Secondary | ICD-10-CM | POA: Diagnosis not present

## 2022-01-05 DIAGNOSIS — K861 Other chronic pancreatitis: Secondary | ICD-10-CM | POA: Diagnosis not present

## 2022-01-05 DIAGNOSIS — K859 Acute pancreatitis without necrosis or infection, unspecified: Secondary | ICD-10-CM | POA: Diagnosis not present

## 2022-01-05 DIAGNOSIS — I10 Essential (primary) hypertension: Secondary | ICD-10-CM | POA: Diagnosis not present

## 2022-01-05 LAB — BASIC METABOLIC PANEL
Anion gap: 7 (ref 5–15)
BUN: 17 mg/dL (ref 8–23)
CO2: 24 mmol/L (ref 22–32)
Calcium: 9 mg/dL (ref 8.9–10.3)
Chloride: 107 mmol/L (ref 98–111)
Creatinine, Ser: 1.16 mg/dL (ref 0.61–1.24)
GFR, Estimated: >60 mL/min (ref >60)
Glucose, Bld: 113 mg/dL — ABNORMAL HIGH (ref 70–99)
Potassium: 3.9 mmol/L (ref 3.5–5.1)
Sodium: 138 mmol/L (ref 135–145)

## 2022-01-05 LAB — MAGNESIUM: Magnesium: 1.5 mg/dL — ABNORMAL LOW (ref 1.7–2.4)

## 2022-01-05 LAB — GLUCOSE, CAPILLARY
Glucose-Capillary: 106 mg/dL — ABNORMAL HIGH (ref 70–99)
Glucose-Capillary: 146 mg/dL — ABNORMAL HIGH (ref 70–99)

## 2022-01-05 MED ORDER — ATORVASTATIN CALCIUM 10 MG PO TABS: 10.0000 mg | ORAL_TABLET | Freq: Every day | ORAL | 1 refills | Status: DC

## 2022-01-05 MED ORDER — MAGNESIUM SULFATE 2 GM/50ML IV SOLN
2.0000 g | Freq: Once | INTRAVENOUS | Status: AC
  Administered 2022-01-05: 2 g via INTRAVENOUS
  Filled 2022-01-05: qty 50

## 2022-01-05 NOTE — Discharge Summary (Signed)
Physician Discharge Summary   Patient: Kyle Giles MRN: [id number] DOB: 15-Dec-1960  Admit date:     01/02/2022  Discharge date: 01/05/22  Discharge Physician: Shanon Brow Datrell Dunton   PCP: Sharilyn Sites, MD   Recommendations at discharge:   Please follow up with primary care provider within 1-2 weeks  Please repeat BMP and CBC in one week     Hospital Course: 61 year old male with a history of chronic pancreatitis, anxiety, hypertension, diabetes mellitus type 2 presenting with epigastric pain that began on 12/29/2021.  It has progressively worsened with some nausea.  He denies any emesis or diarrhea.  He denies any alcohol use.  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  He states that he takes 2 ibuprofen on a daily basis since October 2023.  He denies any other new medications.  He went to see his PCP on 01/01/2022.  Routine blood work was obtained and showed an elevated lipase and elevated serum creatinine.  As result, the patient was directed to go to the emergency department for further evaluation and treatment. In the ED, the patient was afebrile and hemodynamically stable with oxygen saturation 97% room air.  WBC 9.2, hemoglobin 10.4, platelets 291,000.  Lipase 34, AST 23, ALT 21, alk phosphatase 37, total bilirubin 0.8.  Serum creatinine was 3.60.  CT of the abdomen and pelvis showed a new 5.7 cm cystic lesion in the tail of the pancreas.  There is peripancreatic fluid extending from the pseudocyst to the lesser curvature of the stomach.  There is mild prominence of the pancreatic duct.  The patient was started on IV opioids, IV fluids, and placed on bowel rest.  GI was consulted to assist with management.  Assessment and Plan: Acute on chronic pancreatitis -Continue IV opioids>>po opioids as pain is improving -Continued IV fluids -01/02/2022 CT abdomen as above -Start clear liquids>>soft diet which patient tolerated -GI consult appreciated -triglycerides 113   Pancreatic pseudocyst -GI  consult appreciated -noted on 01/02/22 CT -repeat pancreatic imaging 6-8 weeks   Acute kidney injury -Baseline creatinine 0.8-1.1 -Presented with serum creatinine 3.60 -Secondary to volume depletion in the setting of olmesartan HCTZ -Discontinue olmesartan HCTZ - continue IV fluids>>improving -serum creatinine 1/16 on day of d/c   Anxiety -Restart lower dose alprazolam -PDMP reviewed-patient received alprazolam 1 mg, #270 every 3 months   Essential hypertension -Holding olmesartan HCTZ>>restart as BP back up -Holding diltiazem for soft blood pressure>>restart as BP back up   GERD -continue pantoprazole   Mixed hyperlipidemia -continue statin -due to drug-drug interaction with diltiazem, simvastatin has been changed to atorvastatin   Diabetes Mellitus type 2 -01/03/22 A1C-- 6.8 -restart metformin after d/c - outpt follow up with PCP           Consultants: GI Procedures performed: none  Disposition: Home Diet recommendation:  Carb modified diet DISCHARGE MEDICATION: Allergies as of 01/05/2022   No Known Allergies      Medication List     STOP taking these medications    cetirizine 10 MG tablet Commonly known as: ZYRTEC   ibuprofen 200 MG tablet Commonly known as: ADVIL   simvastatin 20 MG tablet Commonly known as: ZOCOR       TAKE these medications    ALPRAZolam 1 MG tablet Commonly known as: Xanax Take 1 tablet by mouth three times daily. What changed:  how much to take how to take this when to take this reasons to take this Another medication with the same name was removed. Continue taking  this medication, and follow the directions you see here.   atorvastatin 10 MG tablet Commonly known as: LIPITOR Take 1 tablet (10 mg total) by mouth daily.   cholecalciferol 25 MCG (1000 UNIT) tablet Commonly known as: VITAMIN D3 Take 1,000 Units by mouth 2 (two) times daily.   diltiazem 360 MG 24 hr capsule Commonly known as: CARDIZEM CD Take 1  capsule (360 mg total) by mouth every evening.   esomeprazole 20 MG capsule Commonly known as: NEXIUM Take 20 mg by mouth in the morning.   fenofibrate 160 MG tablet Take 1 tablet by mouth daily   metFORMIN 500 MG tablet Commonly known as: GLUCOPHAGE Take 1/2 tablet by mouth twice daily.   metFORMIN 500 MG tablet Commonly known as: GLUCOPHAGE Take 250 mg by mouth 2 (two) times daily with a meal.   olmesartan-hydrochlorothiazide 40-25 MG tablet Commonly known as: BENICAR HCT Take 1 tablet by mouth once a day   Systane Complete 0.6 % Soln Generic drug: Propylene Glycol Place 1 drop into both eyes daily.   traMADol 50 MG tablet Commonly known as: ULTRAM Take 100 mg by mouth every 6 (six) hours as needed for moderate pain.        Discharge Exam: Filed Weights   01/02/22 1725 01/02/22 2243  Weight: 52.2 kg 52.1 kg   HEENT:  [place]/AT, No thrush, no icterus CV:  RRR, no rub, no S3, no S4 Lung:  CTA, no wheeze, no rhonchi Abd:  soft/+BS, NT Ext:  No edema, no lymphangitis, no synovitis, no rash   Condition at discharge: stable  The results of significant diagnostics from this hospitalization (including imaging, microbiology, ancillary and laboratory) are listed below for reference.   Imaging Studies: CT ABDOMEN PELVIS WO CONTRAST  Result Date: 01/02/2022 CLINICAL DATA:  Abdominal pain with elevated lipase EXAM: CT ABDOMEN AND PELVIS WITHOUT CONTRAST TECHNIQUE: Multidetector CT imaging of the abdomen and pelvis was performed following the standard protocol without IV contrast. RADIATION DOSE REDUCTION: This exam was performed according to the departmental dose-optimization program which includes automated exposure control, adjustment of the mA and/or kV according to patient size and/or use of iterative reconstruction technique. COMPARISON:  CT abdomen and pelvis 04/21/2021 FINDINGS: Lower chest: No acute abnormality. Hepatobiliary: No focal liver abnormality is seen. No  gallstones, gallbladder wall thickening, or biliary dilatation. Pancreas: Since 04/21/2021 there is a new 5.7 cm cystic lesion in the tail of the pancreas likely a pseudocyst. Peripancreatic fluid is again seen. Peripancreatic fluid and stranding extend from the pseudocyst to the lesser curvature of the stomach which is indistinct and hazy. No free intraperitoneal air to suggest perforation. Mild prominence of the main pancreatic duct increased from prior now measuring approximally 5 mm. There are coarse calcifications in the pancreatic head. Spleen: Normal in size without focal abnormality. Adrenals/Urinary Tract: Adrenal glands are unremarkable. Kidneys are normal, without renal calculi, focal lesion, or hydronephrosis. Bladder is unremarkable. Stomach/Bowel: Indistinctness of the wall of the lesser curvature of the stomach which appears confluence with the peripancreatic stranding. Normal caliber large and small bowel. Normal appendix. Extensive colonic diverticulosis without diverticulitis. Vascular/Lymphatic: Aortic atherosclerosis. No enlarged abdominal or pelvic lymph nodes. Reproductive: Prostate is unremarkable. Other: No free intraperitoneal air. Musculoskeletal: No acute osseous abnormality. IMPRESSION: Acute on chronic pancreatitis. There is a pseudocyst within the tail of the pancreas and acute peripancreatic inflammatory fluid extending between the pancreas and lesser curvature of the stomach. The lesser curvature of the stomach wall is indistinct near the gastric  antrum and confluence with the peripancreatic fluid. This is likely due to reactive edema. No free intraperitoneal air to suggest perforation. Colonic diverticulosis without diverticulitis. Electronically Signed   By: Placido Sou M.D.   On: 01/02/2022 20:17    Microbiology: Results for orders placed or performed during the hospital encounter of 04/21/21  Blood Culture (routine x 2)     Status: None   Collection Time: 04/21/21  7:49  PM   Specimen: Right Antecubital; Blood  Result Value Ref Range Status   Specimen Description   Final    RIGHT ANTECUBITAL BOTTLES DRAWN AEROBIC AND ANAEROBIC   Special Requests Blood Culture adequate volume  Final   Culture   Final    NO GROWTH 5 DAYS Performed at [facility], [address], [place], [place] [address]    Report Status 04/26/2021 FINAL  Final  Blood Culture (routine x 2)     Status: None   Collection Time: 04/21/21  7:55 PM   Specimen: BLOOD LEFT HAND  Result Value Ref Range Status   Specimen Description BLOOD LEFT HAND BOTTLES DRAWN AEROBIC ONLY  Final   Special Requests Blood Culture adequate volume  Final   Culture   Final    NO GROWTH 5 DAYS Performed at [facility], [address], [place], [place] [address]    Report Status 04/26/2021 FINAL  Final  Urine Culture     Status: None   Collection Time: 04/21/21 10:44 PM   Specimen: Urine, Clean Catch  Result Value Ref Range Status   Specimen Description   Final    URINE, CLEAN CATCH Performed at [facility], [address], [place], [place] [address]    Special Requests   Final    NONE Performed at [facility], [address], [place], [place] [address]    Culture   Final    NO GROWTH Performed at [facility], [place] [address], [place], [place] [address]    Report Status 04/23/2021 FINAL  Final    Labs: CBC: Recent Labs  Lab 01/02/22 1900 01/03/22 0402  WBC 10.7* 9.2  NEUTROABS 7.4  --   HGB 10.7* 10.4*  HCT 33.1* 32.1*  MCV 94.8 95.5  PLT 313 967   Basic Metabolic Panel: Recent Labs  Lab 01/02/22 1900 01/03/22 0402 01/04/22 0423 01/05/22 0422  NA 131* 139 141 138  K 4.8 4.2 4.4 3.9  CL 99 107 111 107  CO2 20* _0 GLUCOSE 85 111* 77 113*  BUN 81* 69* 32* 17  CREATININE 3.60* 2.86* 1.45* 1.16  CALCIUM 9.3 9.0 9.1 9.0  MG  --   --  1.9 1.5*   Liver Function Tests: Recent Labs  Lab 01/02/22 1900 01/03/22 0402 01/04/22 0423  AST _1 ALT _2 ALKPHOS  37* 35* 30*  BILITOT 0.8 0.3 0.5  PROT 8.3* 6.9 6.0*  ALBUMIN 4.3 3.6 3.0*   CBG: Recent Labs  Lab 01/04/22 0022 01/04/22 0714 01/04/22 2339 01/05/22 0738 01/05/22 1208  GLUCAP 85 90 110* 106* 146*    Discharge time spent: greater than 30 minutes.  Signed: Orson Eva, MD [facility] 01/05/2022

## 2022-01-05 NOTE — Progress Notes (Signed)
Patient discharged home today, transported home by spouse. Discharge paperwork went over with patient, patient verbalized understanding. Belongings sent with patient.

## 2022-01-10 ENCOUNTER — Other Ambulatory Visit (HOSPITAL_COMMUNITY): Payer: Self-pay

## 2022-01-10 DIAGNOSIS — J209 Acute bronchitis, unspecified: Secondary | ICD-10-CM | POA: Diagnosis not present

## 2022-01-10 DIAGNOSIS — F1020 Alcohol dependence, uncomplicated: Secondary | ICD-10-CM | POA: Diagnosis not present

## 2022-01-10 DIAGNOSIS — K859 Acute pancreatitis without necrosis or infection, unspecified: Secondary | ICD-10-CM | POA: Diagnosis not present

## 2022-01-10 DIAGNOSIS — Z6820 Body mass index (BMI) 20.0-20.9, adult: Secondary | ICD-10-CM | POA: Diagnosis not present

## 2022-01-10 MED ORDER — CETIRIZINE HCL 10 MG PO TABS
10.0000 mg | ORAL_TABLET | Freq: Every day | ORAL | 3 refills | Status: DC
  Filled 2022-01-10: qty 90, 90d supply, fill #0

## 2022-01-10 MED ORDER — CETIRIZINE HCL 10 MG PO TABS
10.0000 mg | ORAL_TABLET | Freq: Every day | ORAL | 3 refills | Status: DC
  Filled 2022-01-10 – 2022-04-08 (×2): qty 90, 90d supply, fill #0

## 2022-01-10 MED ORDER — ATORVASTATIN CALCIUM 10 MG PO TABS
10.0000 mg | ORAL_TABLET | Freq: Every day | ORAL | 3 refills | Status: DC
  Filled 2022-01-10: qty 90, 90d supply, fill #0
  Filled 2022-04-22: qty 90, 90d supply, fill #1
  Filled 2022-07-19: qty 90, 90d supply, fill #2
  Filled 2022-10-28: qty 90, 90d supply, fill #3

## 2022-01-10 NOTE — Telephone Encounter (Signed)
error 

## 2022-01-21 ENCOUNTER — Other Ambulatory Visit (HOSPITAL_COMMUNITY): Payer: Self-pay

## 2022-01-30 ENCOUNTER — Other Ambulatory Visit (HOSPITAL_COMMUNITY): Payer: Self-pay

## 2022-01-30 ENCOUNTER — Other Ambulatory Visit: Payer: Self-pay

## 2022-01-30 MED ORDER — OLMESARTAN MEDOXOMIL-HCTZ 40-25 MG PO TABS
1.0000 | ORAL_TABLET | Freq: Every day | ORAL | 1 refills | Status: DC
  Filled 2022-01-30 (×2): qty 90, 90d supply, fill #0
  Filled 2022-05-18: qty 90, 90d supply, fill #1

## 2022-02-12 NOTE — Progress Notes (Unsigned)
GI Office Note    Referring Provider: Assunta Found, MD Primary Care Physician:  Assunta Found, MD Primary Gastroenterologist: Hennie Duos. [name], DO  Date:  02/12/2022  ID:  Kyle Giles, DOB [birth year], MRN [id number]   Chief Complaint   No chief complaint on file.    History of Present Illness  Kyle Giles is a 62 y.o. male with a history of *** presenting today for hospital follow-up of acute on chronic pancreatitis.  Hospitalized 01/02/2022 - 01/05/22 for acute on chronic pancreatitis. ***  CT A/P 01/02/2022: -***   Today:   Current Outpatient Medications  Medication Sig Dispense Refill   ALPRAZolam (XANAX) 1 MG tablet Take 1 tablet by mouth three times daily. (Patient taking differently: Take 1 mg by mouth 3 (three) times daily as needed for anxiety.) 270 tablet 2   atorvastatin (LIPITOR) 10 MG tablet Take 1 tablet (10 mg total) by mouth daily. 30 tablet 1   atorvastatin (LIPITOR) 10 MG tablet Take 1 tablet (10 mg total) by mouth daily. 90 tablet 3   cetirizine (ZYRTEC) 10 MG tablet Take 1 tablet (10 mg total) by mouth daily. 90 tablet 3   cetirizine (ZYRTEC) 10 MG tablet Take 1 tablet (10 mg total) by mouth daily. 90 tablet 3   cholecalciferol (VITAMIN D3) 25 MCG (1000 UNIT) tablet Take 1,000 Units by mouth 2 (two) times daily.     diltiazem (CARDIZEM CD) 360 MG 24 hr capsule Take 1 capsule (360 mg total) by mouth every evening. 90 capsule 3   esomeprazole (NEXIUM) 20 MG capsule Take 20 mg by mouth in the morning.     fenofibrate 160 MG tablet Take 1 tablet by mouth daily 90 tablet 2   metFORMIN (GLUCOPHAGE) 500 MG tablet Take 250 mg by mouth 2 (two) times daily with a meal.     metFORMIN (GLUCOPHAGE) 500 MG tablet Take 1/2 tablet by mouth twice daily. 90 tablet 1   olmesartan-hydrochlorothiazide (BENICAR HCT) 40-25 MG tablet Take 1 tablet by mouth daily. 90 tablet 1   Propylene Glycol (SYSTANE COMPLETE) 0.6 % SOLN Place 1 drop into both eyes daily.     traMADol  (ULTRAM) 50 MG tablet Take 100 mg by mouth every 6 (six) hours as needed for moderate pain.     No current facility-administered medications for this visit.    Past Medical History:  Diagnosis Date   Anxiety    DM type 2 (diabetes mellitus, type 2) (HCC)    GERD (gastroesophageal reflux disease)    Heart murmur    saw dr Margreta Journey 06-29-2019 echo done    Hypercholesterolemia    Hypertension    Palpitations    none in a long time as of 10-19-2019   Pancreatitis    S/P colonoscopy 09/2009   Dr. Lovell Sheehan: Moderate diverticulosis throughout colon, otherwise normal    Umbilical hernia     Past Surgical History:  Procedure Laterality Date   COLONOSCOPY N/A 12/21/2019   Procedure: COLONOSCOPY;  Surgeon: Franky Macho, MD;  [facility];  Service: Gastroenterology;  Laterality: N/A;   cyst removed from finger  age 79   ESOPHAGOGASTRODUODENOSCOPY (EGD) WITH PROPOFOL N/A 08/13/2021   Procedure: ESOPHAGOGASTRODUODENOSCOPY (EGD) WITH PROPOFOL;  Surgeon: Lanelle Bal, DO;  [facility];  Service: Endoscopy;  Laterality: N/A;  1:30pm   HERNIA REPAIR     as baby   POLYPECTOMY  12/21/2019   Procedure: POLYPECTOMY;  Surgeon: Franky Macho, MD;  [facility];  Service: Gastroenterology;;   pyloric stenosis repair     as baby, operated at [place] [facility] 10/29/2019   Procedure: OPEN UMBILICAL [place];  Surgeon: Clovis Riley, MD;  Location: [facility];  Service: General;  Laterality: N/A;    Family History  Problem Relation Age of Onset   Colon cancer Neg Hx     Allergies as of 02/13/2022   (No Known Allergies)    Social History   Socioeconomic History   Marital status: Married    Spouse name: Not on file   Number of children: 2   Years of education: Not on file   Highest education level: Not on file  Occupational History   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[employer]     Comment: [employer]  Tobacco Use   Smoking status: Every Day    Packs/day: 1.50    Years: 30.00    Total pack years: 45.00    Types: Cigarettes    Passive exposure: Current   Smokeless tobacco: Never   Tobacco comments:    1 ppd since 3 weeks as of 10-19-2019  Vaping Use   Vaping Use: Never used  Substance and Sexual Activity   Alcohol use: Not Currently    Comment: quit 05/30/21   Drug use: Not Currently   Sexual activity: Not on file  Other Topics Concern   Not on file  Social History Narrative   Not on file   Social Determinants of Health   Financial Resource Strain: Not on file  Food Insecurity: No Food Insecurity (01/02/2022)   Hunger Vital Sign    Worried About Running Out of Food in the Last Year: Never true    [place] in the Last Year: Never true  Transportation Needs: No Transportation Needs (01/02/2022)   PRAPARE - Hydrologist (Medical): No    Lack of Transportation (Non-Medical): No  Physical Activity: Not on file  Stress: Not on file  Social Connections: Not on file     Review of Systems   Gen: Denies fever, chills, anorexia. Denies fatigue, weakness, weight loss.  CV: Denies chest pain, palpitations, syncope, peripheral edema, and claudication. Resp: Denies dyspnea at rest, cough, wheezing, coughing up blood, and pleurisy. GI: See HPI Derm: Denies rash, itching, dry skin Psych: Denies depression, anxiety, memory loss, confusion. No homicidal or suicidal ideation.  Heme: Denies bruising, bleeding, and enlarged lymph nodes.   Physical Exam   There were no vitals taken for this visit.  General:   Alert and oriented. No distress noted. Pleasant and cooperative.  Head:  Normocephalic and atraumatic. Eyes:  Conjuctiva clear without scleral icterus. Mouth:  Oral mucosa pink and moist. Good dentition. No lesions. Lungs:  Clear to auscultation bilaterally. No wheezes, rales, or rhonchi. No distress.  Heart:  S1, S2  present without murmurs appreciated.  Abdomen:  +BS, soft, non-tender and non-distended. No rebound or guarding. No HSM or masses noted. Rectal: *** Msk:  Symmetrical without gross deformities. Normal posture. Extremities:  Without edema. Neurologic:  Alert and  oriented x4 Psych:  Alert and cooperative. Normal mood and affect.   Assessment  Kyle Giles is a 62 y.o. male with a history of *** presenting today with   Chronic pancreatitis:   PLAN   ***     Venetia Night, MSN, FNP-BC, AGACNP-BC [facility]

## 2022-02-13 ENCOUNTER — Telehealth: Payer: Self-pay | Admitting: *Deleted

## 2022-02-13 ENCOUNTER — Ambulatory Visit: Payer: Commercial Managed Care - PPO | Admitting: Gastroenterology

## 2022-02-13 ENCOUNTER — Other Ambulatory Visit (HOSPITAL_COMMUNITY): Payer: Self-pay

## 2022-02-13 ENCOUNTER — Encounter: Payer: Self-pay | Admitting: Gastroenterology

## 2022-02-13 ENCOUNTER — Other Ambulatory Visit: Payer: Self-pay

## 2022-02-13 VITALS — BP 158/69 | HR 77 | Temp 97.6°F | Ht 62.0 in | Wt 117.6 lb

## 2022-02-13 DIAGNOSIS — E559 Vitamin D deficiency, unspecified: Secondary | ICD-10-CM | POA: Diagnosis not present

## 2022-02-13 DIAGNOSIS — K219 Gastro-esophageal reflux disease without esophagitis: Secondary | ICD-10-CM | POA: Diagnosis not present

## 2022-02-13 DIAGNOSIS — D649 Anemia, unspecified: Secondary | ICD-10-CM

## 2022-02-13 DIAGNOSIS — R5383 Other fatigue: Secondary | ICD-10-CM | POA: Diagnosis not present

## 2022-02-13 DIAGNOSIS — I10 Essential (primary) hypertension: Secondary | ICD-10-CM | POA: Diagnosis not present

## 2022-02-13 DIAGNOSIS — K861 Other chronic pancreatitis: Secondary | ICD-10-CM

## 2022-02-13 MED ORDER — ESOMEPRAZOLE MAGNESIUM 20 MG PO CPDR
20.0000 mg | DELAYED_RELEASE_CAPSULE | Freq: Every morning | ORAL | 3 refills | Status: DC
  Filled 2022-02-13 – 2022-02-15 (×2): qty 30, 30d supply, fill #0
  Filled 2022-04-07: qty 30, 30d supply, fill #1

## 2022-02-13 NOTE — Patient Instructions (Addendum)
I am attaching some education for you regarding anemia as well as chronic pancreatitis.  As we discussed I am ordering a CT scan of your pancreas to reassess after your most recent episode of pancreatitis.  I am also ordering lab work for you to have completed this week.  You may take these lab slips with you to your PCP to have completed or go to nearest [facility].  Follow a GERD diet:  Avoid fried, fatty, greasy, spicy, citrus foods. Avoid caffeine and carbonated beverages. Avoid chocolate. Try eating 4-6 small meals a day rather than 3 large meals. Do not eat within 3 hours of laying down. Prop head of bed up on wood or bricks to create a 6 inch incline.  Continue Nexium 20 mg daily, refill sent to pharmacy today.   Continue to take MiraLAX as needed for constipation.  Will plan to see you in 2 months, or sooner if needed.  We will be in touch with any further recommendations after receiving results of her lab work.  It was a pleasure to see you today. I want to create trusting relationships with patients. If you receive a survey regarding your visit,  I greatly appreciate you taking time to fill this out on paper or through your MyChart. I value your feedback.  Venetia Night, MSN, FNP-BC, AGACNP-BC [facility]

## 2022-02-13 NOTE — Telephone Encounter (Signed)
Pt informed that CT is scheduled for 02/26/22 at 12 pm,arrive at 11:30 am to check in, nothing to eat or drink 4 hours prior.  Called insurance and no PA is required. Reference # [id number]

## 2022-02-15 ENCOUNTER — Other Ambulatory Visit (HOSPITAL_COMMUNITY): Payer: Self-pay

## 2022-02-15 DIAGNOSIS — R5383 Other fatigue: Secondary | ICD-10-CM | POA: Diagnosis not present

## 2022-02-15 DIAGNOSIS — F17210 Nicotine dependence, cigarettes, uncomplicated: Secondary | ICD-10-CM | POA: Diagnosis not present

## 2022-02-15 DIAGNOSIS — R400 Somnolence: Secondary | ICD-10-CM | POA: Diagnosis not present

## 2022-02-15 MED ORDER — ALPRAZOLAM 1 MG PO TABS
1.0000 mg | ORAL_TABLET | Freq: Three times a day (TID) | ORAL | 0 refills | Status: DC
  Filled 2022-02-25: qty 270, 90d supply, fill #0

## 2022-02-20 ENCOUNTER — Other Ambulatory Visit: Payer: Self-pay

## 2022-02-20 ENCOUNTER — Other Ambulatory Visit (HOSPITAL_COMMUNITY): Payer: Self-pay

## 2022-02-20 MED ORDER — FENOFIBRATE 160 MG PO TABS
160.0000 mg | ORAL_TABLET | Freq: Every day | ORAL | 2 refills | Status: DC
  Filled 2022-02-20: qty 90, 90d supply, fill #0
  Filled 2022-05-18: qty 90, 90d supply, fill #1
  Filled 2022-08-17: qty 90, 90d supply, fill #2

## 2022-02-25 ENCOUNTER — Other Ambulatory Visit (HOSPITAL_COMMUNITY): Payer: Self-pay

## 2022-02-26 ENCOUNTER — Ambulatory Visit (HOSPITAL_COMMUNITY)
Admission: RE | Admit: 2022-02-26 | Discharge: 2022-02-26 | Disposition: A | Discharge status: Home / Self Care | Payer: Commercial Managed Care - PPO | Source: Ambulatory Visit | Attending: Gastroenterology | Admitting: Gastroenterology

## 2022-02-26 DIAGNOSIS — K861 Other chronic pancreatitis: Secondary | ICD-10-CM | POA: Diagnosis not present

## 2022-02-26 DIAGNOSIS — K862 Cyst of pancreas: Secondary | ICD-10-CM | POA: Diagnosis not present

## 2022-02-26 LAB — POCT I-STAT CREATININE: Creatinine, Ser: 1.4 mg/dL — ABNORMAL HIGH (ref 0.61–1.24)

## 2022-02-26 MED ORDER — IOHEXOL 300 MG/ML  SOLN
100.0000 mL | Freq: Once | INTRAMUSCULAR | Status: AC | PRN
  Administered 2022-02-26: 100 mL via INTRAVENOUS

## 2022-03-01 ENCOUNTER — Telehealth (INDEPENDENT_AMBULATORY_CARE_PROVIDER_SITE_OTHER): Payer: Self-pay | Admitting: Gastroenterology

## 2022-03-01 NOTE — Telephone Encounter (Signed)
Attempted to call patient to discuss CT with him as well as recommendations dicussed with Dr. Rush Landmark with [facility]. No answer.   Given the findings and recommendations I did not leave a voicemail. Please call patient to let him know the fluid collection appears to be large enough to drain which can be done via endoscopic ultrasound with Dr. Rush Landmark and given his pain returning this would likely be best.   He has some pancreatic anatomy which can put him at risk for recurrent pancreatitis (this is called pancreatic divisum) and further evaluation can be performed with an MRI/MRCP that would allow a better look at his anatomy for Dr. Rush Landmark to discuss possible treatment options. Please let patient know I can order MRI if we would like to proceed with further evaluation and we can set him up with Dr. Rush Landmark in his clinic.   If patient agrees we can place referral order as well as order MRCP. If he has any questions please let me know and I am happy to speak to him at some point during the day Monday when not seeing clinic patients.   Venetia Night, MSN, APRN, FNP-BC, AGACNP-BC [facility]

## 2022-03-05 ENCOUNTER — Ambulatory Visit (INDEPENDENT_AMBULATORY_CARE_PROVIDER_SITE_OTHER): Payer: Commercial Managed Care - PPO | Admitting: Gastroenterology

## 2022-03-05 ENCOUNTER — Encounter: Payer: Self-pay | Admitting: Gastroenterology

## 2022-03-05 VITALS — BP 144/80 | HR 90 | Temp 97.6°F | Ht 62.0 in | Wt 114.0 lb

## 2022-03-05 DIAGNOSIS — R1013 Epigastric pain: Secondary | ICD-10-CM | POA: Diagnosis not present

## 2022-03-05 DIAGNOSIS — K219 Gastro-esophageal reflux disease without esophagitis: Secondary | ICD-10-CM | POA: Diagnosis not present

## 2022-03-05 DIAGNOSIS — Q453 Other congenital malformations of pancreas and pancreatic duct: Secondary | ICD-10-CM | POA: Diagnosis not present

## 2022-03-05 DIAGNOSIS — K863 Pseudocyst of pancreas: Secondary | ICD-10-CM

## 2022-03-05 DIAGNOSIS — K4090 Unilateral inguinal hernia, without obstruction or gangrene, not specified as recurrent: Secondary | ICD-10-CM

## 2022-03-05 NOTE — Progress Notes (Unsigned)
GI Office Note    Referring Provider: Sharilyn Sites, MD Primary Care Physician:  Kyle Sites, MD Primary Gastroenterologist: Kyle Giles. Kyle Chatters, DO  Date:  03/05/2022  ID:  Kyle Giles, DOB 09-24-1960, MRN [id number]   Chief Complaint   Chief Complaint  Patient presents with   Abdominal Pain    Middle of the stomach a lot of pain. Small swollen area on left side groin. Some diarrhea this morning.       History of Present Illness  Kyle Giles is a 63 y.o. male with a history of chronic pancreatitis, hepatic steatosis, anxiety, HTN, diabetes presenting today with complaint of ongoing stomach issues/abdominal pain.   Last office visit 02/13/22.  Reported couple episodes of breakthrough of GERD however fairly well-controlled.  Breakthrough likely diet related.  Has been experiencing major fatigue and falling asleep frequently during the day with being still.  Resigned from his job due to lack of energy and concern for falls and safety purposes.  Usually with a good appetite and eating adequately.  Denied weight loss.  Denied any diarrhea more so having constipation.  Uses MiraLAX as needed.  Had cut back on smoking.  Taking vitamin D.  Occasional NSAID use.  CBC, iron panel, B12, folate, and vitamin A, D, E, and K ordered.  CT pancreatic protocol ordered.  Advise continue Nexium 20 mg daily.  Given refill.  Advised to continue MiraLAX as needed.  Follow-up with PCP for blood pressure management.  CT A/P with and without contrast 02/26/22: -Interval enlargement of unilocular cystic lesion along the body of the pancreas most consistent with pancreatic pseudocyst -No acute pancreatic inflammation -Ductus divisum variant ductal anatomy with calcification at the orifice of the accessory duct.  No significant ductal dilation currently.  Per Dr. Rush Landmark fluid collection appears to be large enough to drain which can be done via endoscopic ultrasound  given his pain returning this would  likely be best. He has some pancreatic anatomy which can put him at risk for recurrent pancreatitis (this is called pancreatic divisum) and further evaluation can be performed with an MRI/MRCP that would allow a better look at his anatomy for Dr. Rush Landmark to discuss possible treatment options.   Today:  Was tender on day of CT scan and then later that afternoon after dinner it was more severe and was abel to get into position where it didn't hurt. Giles on a blander diet was helping some. Trying to eat a normal supper and nothing spicy and started having more pain. Friday he had no pain. Last Wednesday he was strict diet and Thursday was able to eat without pain. Friday was able to run errands and clean around the house. After supper Friday night 10- minutes after he started having pain. From late Friday to Sunday afternoon he ate with pain. No nausea or vomiting. Has been drinking a lot of water.   This morning woke up with pain and had some small normal bowel movements and then after that had some diarrhea.   Reports he thinks he may have a hernia to left groin area. Shortly after working at the [employer] he noticed a pea size area to his left groin and thinks it is more noticeable now and it was tender to the touch as well. Had LLQ tenderness on Friday and this morning. Had umbilical hernia repaired in the past.   Fatigue is still present.    Current Outpatient Medications  Medication Sig Dispense Refill  ALPRAZolam (XANAX) 1 MG tablet Take 1 tablet (1 mg total) by mouth 3 (three) times daily. 270 tablet 0   atorvastatin (LIPITOR) 10 MG tablet Take 1 tablet (10 mg total) by mouth daily. 90 tablet 3   cholecalciferol (VITAMIN D3) 25 MCG (1000 UNIT) tablet Take 1,000 Units by mouth 2 (two) times daily.     diltiazem (CARDIZEM CD) 360 MG 24 hr capsule Take 1 capsule (360 mg total) by mouth every evening. 90 capsule 3   esomeprazole (NEXIUM) 20 MG capsule Take 1 capsule (20 mg total) by  mouth in the morning. 30 capsule 3   fenofibrate 160 MG tablet Take 1 tablet (160 mg total) by mouth daily. 90 tablet 2   metFORMIN (GLUCOPHAGE) 500 MG tablet Take 250 mg by mouth 2 (two) times daily with a meal.     olmesartan-hydrochlorothiazide (BENICAR HCT) 40-25 MG tablet Take 1 tablet by mouth daily. 90 tablet 1   Propylene Glycol (SYSTANE COMPLETE) 0.6 % SOLN Place 1 drop into both eyes daily.     cetirizine (ZYRTEC) 10 MG tablet Take 1 tablet (10 mg total) by mouth daily. (Patient not taking: Reported on 02/13/2022) 90 tablet 3   No current facility-administered medications for this visit.    Past Medical History:  Diagnosis Date   Anxiety    DM type 2 (diabetes mellitus, type 2) (HCC)    GERD (gastroesophageal reflux disease)    Heart murmur    saw dr Willeen Cass 06-29-2019 echo done    Hypercholesterolemia    Hypertension    Palpitations    none in a long time as of 10-19-2019   Pancreatitis    S/P colonoscopy 09/2009   Dr. Arnoldo Morale: Moderate diverticulosis throughout colon, otherwise normal    Umbilical hernia     Past Surgical History:  Procedure Laterality Date   COLONOSCOPY N/A 12/21/2019   Procedure: COLONOSCOPY;  Surgeon: Aviva Signs, MD;  [facility];  Service: Gastroenterology;  Laterality: N/A;   cyst removed from finger  age 23   ESOPHAGOGASTRODUODENOSCOPY (EGD) WITH PROPOFOL N/A 08/13/2021   Procedure: ESOPHAGOGASTRODUODENOSCOPY (EGD) WITH PROPOFOL;  Surgeon: Eloise Harman, DO;  [facility];  Service: Endoscopy;  Laterality: N/A;  1:30pm   HERNIA REPAIR     as baby   POLYPECTOMY  12/21/2019   Procedure: POLYPECTOMY;  Surgeon: Aviva Signs, MD;  [facility];  Service: Gastroenterology;;   pyloric stenosis repair     as baby, operated at [place] 10/29/2019   Procedure: OPEN UMBILICAL [place];  Surgeon: Clovis Riley, MD;  Location: [place] [facility];  Service:  General;  Laterality: N/A;    Family History  Problem Relation Age of Onset   Colon cancer Neg Hx     Allergies as of 03/05/2022   (No Known Allergies)    Social History   Socioeconomic History   Marital status: Married    Spouse name: Not on file   Number of children: 2   Years of education: Not on file   Highest education level: Not on file  Occupational History   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[employer]    Comment: [employer]  Tobacco Use   Smoking status: Every Day    Packs/day: 1.50    Years: 30.00    Total pack years: 45.00    Types: Cigarettes    Passive exposure: Current   Smokeless tobacco:  Never   Tobacco comments:    1 ppd since 3 weeks as of 10-19-2019  Vaping Use   Vaping Use: Never used  Substance and Sexual Activity   Alcohol use: Not Currently    Comment: quit 05/30/21   Drug use: Not Currently   Sexual activity: Not on file  Other Topics Concern   Not on file  Social History Narrative   Not on file   Social Determinants of Health   Financial Resource Strain: Not on file  Food Insecurity: No Food Insecurity (01/02/2022)   Hunger Vital Sign    Worried About Running Out of Food in the Last Year: Never true    Ran Out of Food in the Last Year: Never true  Transportation Needs: No Transportation Needs (01/02/2022)   PRAPARE - Administrator, Civil Service (Medical): No    Lack of Transportation (Non-Medical): No  Physical Activity: Not on file  Stress: Not on file  Social Connections: Not on file     Review of Systems   Gen: Denies fever, chills, anorexia. Denies fatigue, weakness, weight loss.  CV: Denies chest pain, palpitations, syncope, peripheral edema, and claudication. Resp: Denies dyspnea at rest, cough, wheezing, coughing up blood, and pleurisy. GI: See HPI Derm: Denies rash, itching, dry skin Psych: Denies depression, anxiety, memory loss, confusion. No homicidal or suicidal ideation.  Heme:  Denies bruising, bleeding, and enlarged lymph nodes.   Physical Exam   BP (!) 144/80 (BP Location: Right Arm, Patient Position: Sitting, Cuff Size: Normal)   Pulse 90   Temp 97.6 F (36.4 C) (Temporal)   Ht 5\' 2"  (1.575 m)   Wt 114 lb (51.7 kg)   SpO2 97%   BMI 20.85 kg/m   General:   Alert and oriented. No distress noted. Pleasant and cooperative.  Head:  Normocephalic and atraumatic. Eyes:  Conjuctiva clear without scleral icterus. Mouth:  Oral mucosa pink and moist. Good dentition. No lesions. Lungs:  Clear to auscultation bilaterally. No wheezes, rales, or rhonchi. No distress.  Heart:  S1, S2 present without murmurs appreciated.  Abdomen:  +BS, soft, non-distended. Mild ttp to epigastrium and LUQ. No rebound or guarding. No HSM or masses noted. Left inguinal golf ball sized soft tissue mass, non tender.  Rectal: deferred Msk:  Symmetrical without gross deformities. Normal posture. Extremities:  Without edema. Neurologic:  Alert and  oriented x4 Psych:  Alert and cooperative. Normal mood and affect.   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    Pancreatitis, possible pancreatic divisum, epigastric pain: Two prior episodes of pancreatitis in 2023 requiring hospitalization. Most recent one the end of November with Ct evidence of a likely pseudocyst in the tail of the pancreas and coarse calcifications in the head of the pancreas. Previous alcohol use however quit since his initial pancreatitis episode and reports no significant intake prior to that. Previous workup with normal calcium and triglycerides. Etiology unclear at this time however most recent CT 02/26/22 with interval enlargement of pseudocyst and no acute inflammation. Also with ductus divisum and calcification. Patient has bene experiencing intermittent bouts of  nausea and abdominal pain. Couple episodes of diarrhea but nothing significant. Case discussed with Dr. 02/28/22 and feels as though he may benefit from drainage of cyst and recommended MRI for further evaluation of ducts to assess for potential treatments of pancreatic  divisum which could have contributed to previous episodes of pancreatitis. We will set up EUS with cystgastrostomy with [facility] and proceed with MRI/MRCP for further evaluation with Dr Rush Landmark.   GERD: Controlled on Nexium 20 mg daily.  LLQ pain, left inguinal hernia: Reports swelling and some mild tenderness to left inguinal region a few days ago. States previously was pea sized. On exam today golf all sized soft tissue mass palpated which was nontender. Patient would like further evaluation and consideration for treatment therefore will refer to general surgery to discuss repair.   Awaiting labs previously ordered and performed at patients PCP to further assess patients fatigue and anemia. Will request these today.   PLAN   General surgery referral for possible Left inguinal hernia repair.  Request labs from Dr. Hilma Favors Referral to [facility] to Dr. Rush Landmark for EUS and cystogastrostomy Low fat diet MRI/MRCP Follow up in 2-3 months     Venetia Night, MSN, FNP-BC, AGACNP-BC [facility]

## 2022-03-05 NOTE — Telephone Encounter (Signed)
Pt giving recommendations at [facility].

## 2022-03-05 NOTE — Patient Instructions (Addendum)
We are going to send a referral over to general surgery for you to discuss your left inguinal hernia.  I am also sending a referral to [facility] for you to discuss drainage of your pancreatic pseudocyst and treatment options regarding pancreatic divisum with Dr. Rush Landmark.   Please follow a low-fat diet.  In order to avoid pain please follow a softer/bland diet.  Continue daily Nexium.  Will plan to follow-up in 2-3 months, sooner if needed.  Will request her most recent lab work from Dr. Delanna Ahmadi office.  It was a pleasure to see you today. I want to create trusting relationships with patients. If you receive a survey regarding your visit,  I greatly appreciate you taking time to fill this out on paper or through your MyChart. I value your feedback.  Venetia Night, MSN, FNP-BC, AGACNP-BC [facility]

## 2022-03-06 ENCOUNTER — Encounter: Payer: Self-pay | Admitting: Gastroenterology

## 2022-03-06 ENCOUNTER — Other Ambulatory Visit: Payer: Self-pay

## 2022-03-06 ENCOUNTER — Telehealth: Payer: Self-pay

## 2022-03-06 ENCOUNTER — Telehealth (INDEPENDENT_AMBULATORY_CARE_PROVIDER_SITE_OTHER): Payer: Self-pay | Admitting: Gastroenterology

## 2022-03-06 DIAGNOSIS — Q453 Other congenital malformations of pancreas and pancreatic duct: Secondary | ICD-10-CM

## 2022-03-06 DIAGNOSIS — K863 Pseudocyst of pancreas: Secondary | ICD-10-CM

## 2022-03-06 NOTE — Telephone Encounter (Signed)
EUS scheduled, pt instructed and medications reviewed.  Patient instructions mailed to home.  Patient to call with any questions or concerns.  

## 2022-03-06 NOTE — Telephone Encounter (Signed)
Mansouraty, Telford Nab., MD  Sherron Monday, NP; Timothy Lasso, RN CLM, Thank you for letting us know. Please reply back to Jennet Scroggin once you have spoken with the patient. If he wants to be seen in clinic first that is fine if he wants to be seen for the procedure that is fine too. Just reply back as able. Shon Hough, Wait for NP Mahon's reply before reaching out to patient. Thanks. GM

## 2022-03-06 NOTE — Telephone Encounter (Signed)
----- Message from Irving Copas., MD sent at 03/05/2022  5:35 PM EST ----- Regarding: RE: pancreatic CT CLM, Thank you for the update. We can work on getting him scheduled for EUS cystgastrostomy in the coming weeks. I think the MRI/MRCP makes sense and I can review that. If he is willing to move forward with out meeting me but discussing the procedure at the time of the procedure visit, I will get him scheduled next available with me. I am away for the next week and a half and have booked up pretty quickly but I may have some availability at the end of February beginning of March to do his EUS. I will have my nurse work on arranging the EUS if you can go ahead and get that MRI ordered. Thanks. GM  Niyonna Betsill, This is a patient from [facility] who needs a cystgastrostomy. Patient will also need an MRI but they will work on scheduling that to evaluate pancreatic divisum. The goal for the EUS is to evaluate the pancreas and perform cystgastrostomy. Please offer him one of our held slots for EUS in the coming weeks. If he wants to be seen in clinic first then schedule him as an overbook with me next available or any health slot. Thanks. GM ----- Message ----- From: Sherron Monday, NP Sent: 03/05/2022   5:11 PM EST To: Irving Copas., MD Subject: RE: pancreatic CT                              He is complaining of recurrent pain and fatigue and some intermittent diarrhea so I have sent over referral and discussed with him about possible EUS and drainage as well as further workup of pancreatic divisum and he is good with that. Did you want me to go ahead and order MRI? I am happy to try to get that done prior to you seeing him if it will help expedite your workup and plan.   Venetia Night.    ----- Message ----- From: Irving Copas., MD Sent: 02/28/2022   5:48 PM EST To: Sherron Monday, NP Subject: RE: pancreatic CT                              CLM, Had a  chance to review the CT. It is ready for drainage. The consideration of draining this is not an unreasonable thing based on symptoms but looking at things it seems like he is doing well, not losing weight, not having pain. In regards to the question of the pancreas disease and the anatomy, I think this would be better served with an MRI/MRCP but if he has no other etiology for recurrent pancreatitis, and were to have another bout of recurrent pancreatitis, there could be some consideration of trying to perform minor papillotomy for pancreatic divisum. If you think that he is willing to undergo his MRI MRCP, then I will get that and if he wants to see me to discuss consideration of pancreatic divisum evaluation further I would be happy to set him up for a clinic visit in the coming weeks. Let me know what you think he wants. If he is having more dramatic symptoms or is losing weight and having early satiety, I can bring him in for an EUS to go ahead and just drain his cyst. Thanks. GM ----- Message ----- From: Venetia Night  L, NP Sent: 02/27/2022  12:06 PM EST To: Irving Copas., MD Subject: pancreatic CT                                  Hi Dr. Rush Landmark,  I have this patient who has had pancreatitis with pseudocyst recently and treated supportively inpatient.   Follow up CT with pancreatic protocol shows unilocular enlargement of pseudocyst.  Patient does not have any complaints of pain at his most recent follow-up however he does continue to have significant fatigue, unclear etiology at this time, awaiting for him to complete some lab work.  No diarrhea.  I am curious regarding this fluid collection if you feel like it is large enough to warrant being drained or to just continue to monitor given he is not actively having pain.  Also in regards to ductus divisum variant ductal anatomy with calcification at the orifice of the accessory duct if you feel like MRCP or EUS would be  appropriate.  I see there are mixed thoughts on if pancreatic divisum can contribute to pancreatitis.  Pancreatitis previously suspected to be due to alcohol however in further discussions with the patient he reports he was never drinking lots of alcohol on a daily basis and other workup has been unremarkable at this time.  Appreciate your input, thank you. Venetia Night, MSN, APRN, FNP-BC, AGACNP-BC [facility]

## 2022-03-06 NOTE — Telephone Encounter (Signed)
Sherron Monday, NP  Mansouraty, Telford Nab., MD; Timothy Lasso, RN He is agreeable to MRI and setting up EUS with cystgastrostomy. Bradleigh Sonnen if you will just contact him and let him know about procedure when you are able to schedule and I will have my staff arrange MRI for him.  Thank you, Venetia Night, NP

## 2022-03-06 NOTE — Telephone Encounter (Signed)
Please arrange MRI pancreatic protocol for this patient.  Diagnosis: pancreatic divisum/recurrent pancreatitis.  Venetia Night, MSN, APRN, FNP-BC, AGACNP-BC [facility] at [facility]

## 2022-03-06 NOTE — Telephone Encounter (Signed)
EUS has been scheduled for 05/02/22 at 330 pm with GM at [facility]   Left message on machine to call back

## 2022-03-07 NOTE — Telephone Encounter (Signed)
MRI/MRCP scheduled. Appt letter sent to patient in mychart.    Called provider services and was advised no PA is required for procedure code. Call ref# [id number] [id number]

## 2022-03-16 ENCOUNTER — Other Ambulatory Visit (HOSPITAL_COMMUNITY): Payer: Self-pay

## 2022-03-18 ENCOUNTER — Other Ambulatory Visit: Payer: Self-pay

## 2022-03-18 ENCOUNTER — Other Ambulatory Visit (HOSPITAL_COMMUNITY): Payer: Self-pay

## 2022-03-18 MED ORDER — METFORMIN HCL 500 MG PO TABS
250.0000 mg | ORAL_TABLET | Freq: Two times a day (BID) | ORAL | 1 refills | Status: DC
  Filled 2022-03-18: qty 90, 90d supply, fill #0
  Filled 2022-06-17: qty 90, 90d supply, fill #1

## 2022-03-22 ENCOUNTER — Telehealth: Payer: Self-pay | Admitting: *Deleted

## 2022-03-22 NOTE — Telephone Encounter (Signed)
Pt called and states he is having several flare ups a week. States his hernia has been hurting the last week. He has not heard from general surgery either.

## 2022-03-25 ENCOUNTER — Other Ambulatory Visit: Payer: Self-pay | Admitting: *Deleted

## 2022-03-25 DIAGNOSIS — K4090 Unilateral inguinal hernia, without obstruction or gangrene, not specified as recurrent: Secondary | ICD-10-CM

## 2022-03-25 NOTE — Telephone Encounter (Signed)
Spoke pt, informed him of recommendations. Pt voiced understanding.

## 2022-03-27 ENCOUNTER — Ambulatory Visit (HOSPITAL_COMMUNITY)
Admission: RE | Admit: 2022-03-27 | Discharge: 2022-03-27 | Disposition: A | Discharge status: Home / Self Care | Payer: Commercial Managed Care - PPO | Source: Ambulatory Visit | Attending: Gastroenterology | Admitting: Gastroenterology

## 2022-03-27 ENCOUNTER — Other Ambulatory Visit: Payer: Self-pay | Admitting: Gastroenterology

## 2022-03-27 DIAGNOSIS — K861 Other chronic pancreatitis: Secondary | ICD-10-CM | POA: Diagnosis not present

## 2022-03-27 DIAGNOSIS — Q453 Other congenital malformations of pancreas and pancreatic duct: Secondary | ICD-10-CM | POA: Insufficient documentation

## 2022-03-27 DIAGNOSIS — K863 Pseudocyst of pancreas: Secondary | ICD-10-CM | POA: Insufficient documentation

## 2022-03-27 MED ORDER — GADOBUTROL 1 MMOL/ML IV SOLN
6.0000 mL | Freq: Once | INTRAVENOUS | Status: AC | PRN
  Administered 2022-03-27: 6 mL via INTRAVENOUS

## 2022-04-08 ENCOUNTER — Other Ambulatory Visit (HOSPITAL_COMMUNITY): Payer: Self-pay

## 2022-04-08 ENCOUNTER — Other Ambulatory Visit: Payer: Self-pay

## 2022-04-09 ENCOUNTER — Encounter: Payer: Self-pay | Admitting: Surgery

## 2022-04-09 ENCOUNTER — Ambulatory Visit: Payer: Commercial Managed Care - PPO | Admitting: Surgery

## 2022-04-09 VITALS — BP 146/77 | HR 74 | Temp 98.0°F | Resp 12 | Ht 62.0 in | Wt 115.0 lb

## 2022-04-09 DIAGNOSIS — K4090 Unilateral inguinal hernia, without obstruction or gangrene, not specified as recurrent: Secondary | ICD-10-CM

## 2022-04-09 NOTE — Patient Instructions (Signed)
Tentatively scheduled for Monday, 05/13/22

## 2022-04-09 NOTE — Progress Notes (Unsigned)
GI Office Note    Referring Provider: Sharilyn Sites, MD Primary Care Physician:  Sharilyn Sites, MD Primary Gastroenterologist: Elon Alas. Abbey Chatters, DO  Date:  04/10/2022  ID:  Kyle Giles, DOB Sep 13, 1960, MRN [id number]   Chief Complaint   Chief Complaint  Patient presents with   Follow-up    Follow up on pancreatic pseudocyst   History of Present Illness  Kyle Giles is a 62 y.o. male with a history of chronic pancreatitis, hepatic steatosis, anxiety, HTN, diabetes presenting today for follow up.  Colonoscopy in 2021 by Dr. Arnoldo Morale which showed 1 hyperplastic polyp.   Hospital admission in March 2023 for acute pancreatitis.  Felt to be alcohol induced as patient was simply drinking with 6 beers as well as liquor daily.  CT imaging showed fatty liver and with mention of duodenitis likely secondary to his pancreatitis.  Denied NSAID use.Marland Kitchen  Has some abnormal LFTs during hospitalization.  Viral hepatitis panel negative.  Improved shortly with conservative measures and was discharged home.  Recovered well but then started having recurrent epigastric pain which prompted an ER visit in April 2023 which she was discharged home.  He had began taking Nexium daily.   LFTs within normal limits in June 2023.   EGD July 2023: -Normal esophagus, stomach, and duodenum -Continue Nexium 20 mg daily   Hospitalized 01/02/2022 - 01/05/22 for acute on chronic pancreatitis. Presenting with 4 days of abdominal pain and advised to proceed to the hospital with recommendation of his PCP regarding high lipase and AKI.  He denied any recent medication changes.  Prior workup with normal triglycerides and calcium.  At this time he also had acute kidney injury.  Creatinine 3.6 on admission.  Likely due to dehydration.  CT imaging as outlined below.  Advised to start taking pancreatic enzymes once able to tolerate solids (Creon 48,000 units with meals and 24,000 units with snacks).  Also recommended complete  alcohol cessation.  Outpatient follow-up in 4 weeks.  Dedicated pancreatic CT in 6-8 weeks.   CT A/P 01/02/2022: -5.7 cm cystic lesion in tail of pancreas likely pseudocyst -Peripancreatic fluid seen -Peripancreatic fluid and stranding extending from the pseudocyst to the lesser curvature of the stomach -No free air to suggest perforation -Mild prominence of main pancreatic duct increased from prior now measuring approximately 5 mm -Coarse calcifications in the pancreatic head -Hepatobiliary system within normal limits -Indistinctness of the wall of the lesser curvature of the stomach which appears, confluent with peripancreatic stranding. -Extensive colonic diverticulosis without diverticulitis   Patient seen in the office 02/13/2022 and 03/05/2022.  At the beginning of January his GERD was fairly well-controlled.  Was having significant major fatigue and falling asleep during the day.  Recently resigned from his job.  Had a good appetite and no weight loss.  Also not experiencing any diarrhea, primarily having constipation using MiraLAX.  Denied any melena or BRBPR.  Had occasional abdominal pain but also having a lot more gas.  Occasional NSAID use.  Denied any pica.  Taking vitamin D.  Advised to check CBC, iron panel, B12, folate, and vitamin A, D, E and K.  He was scheduled for CT pancreatic protocol.  Refilled Nexium.  Advised to continue MiraLAX as needed.  At the end of January patient reported having tenderness vomiting a CT scan then had more extensive severe pain that night at dinner.  Reducing to a blander diet was helping.  Had a couple days without pain but then pain returned  intermittently.  He denies any nausea or vomiting and has been drinking more water.  Also complaining of pain to his left groin area stating a possible hernia that had grown in size.  On exam he did have a golf ball sized soft tissue mass that was nontender.  He was referred to general surgery to discuss hernia repair.   He was referred to Dr. Rush Landmark for EUS and possible cyst gastrostomy.  As stated below MRI/MRCP ordered per his recommendation.  CT A/P with and without contrast 02/26/22: -Interval enlargement of unilocular cystic lesion along the body of the pancreas most consistent with pancreatic pseudocyst -No acute pancreatic inflammation -Ductus divisum variant ductal anatomy with calcification at the orifice of the accessory duct.  No significant ductal dilation currently.   Per Dr. Rush Landmark fluid collection appears to be large enough to drain which can be done via endoscopic ultrasound  given his pain returning this would likely be best. He has some pancreatic anatomy which can put him at risk for recurrent pancreatitis (this is called pancreatic divisum) and further evaluation can be performed with an MRI/MRCP that would allow a better look at his anatomy for Dr. Rush Landmark to discuss possible treatment options.    MRI/MRCP 03/27/2022: -Decrease size of 6.2 cm pseudocyst adjacent to pancreatic tail -Findings of chronic pancreatitis and pancreas divisum -No evidence of acute pancreatic inflammation. -Advised to keep follow-up with EUS with Dr. Rush Landmark already planned for the end of March  Today:  Has off and on dull epigastric pain but primarily having lower left inguinal area. Appetite is good and has learned to not overeat. Has lost some more weight. Gets occasional constipation but not bad. No diarrhea. No melena or brbpr. Some mild reflux one night in a while - possible something he ate. Still taking Nexium once daily. No recent fevers/chills. Has taken tylenol, no NSAIDs. No early satiety. Mornings he has a homemade egg mcmuffin- canadian bacon, alternates with cereal. Lunch he has a [place] sandwich or tuna sand which. Eats primarily chicken at dinner and usually a vegetable and  a starch. Brussels sprouts bother him. Does not eat a lot of fish.   Seen general surgery yesterday and reports he  weighed 110 then and after re calibration that it was the same.   Wt Readings from Last 3 Encounters:  04/10/22 109 lb 9.6 oz (49.7 kg)  04/09/22 115 lb (52.2 kg)  03/05/22 114 lb (51.7 kg)     Current Outpatient Medications  Medication Sig Dispense Refill   ALPRAZolam (XANAX) 1 MG tablet Take 1 tablet (1 mg total) by mouth 3 (three) times daily. 270 tablet 0   atorvastatin (LIPITOR) 10 MG tablet Take 1 tablet (10 mg total) by mouth daily. 90 tablet 3   cetirizine (ZYRTEC) 10 MG tablet Take 1 tablet (10 mg total) by mouth daily. 90 tablet 3   cholecalciferol (VITAMIN D3) 25 MCG (1000 UNIT) tablet Take 1,000 Units by mouth 2 (two) times daily.     diltiazem (CARDIZEM CD) 360 MG 24 hr capsule Take 1 capsule (360 mg total) by mouth every evening. 90 capsule 3   esomeprazole (NEXIUM) 20 MG capsule Take 1 capsule (20 mg total) by mouth in the morning. 30 capsule 3   fenofibrate 160 MG tablet Take 1 tablet (160 mg total) by mouth daily. 90 tablet 2   metFORMIN (GLUCOPHAGE) 500 MG tablet Take 0.5 tablets (250 mg total) by mouth 2 (two) times daily. 90 tablet 1   olmesartan-hydrochlorothiazide (  BENICAR HCT) 40-25 MG tablet Take 1 tablet by mouth daily. 90 tablet 1   Propylene Glycol (SYSTANE COMPLETE) 0.6 % SOLN Place 1 drop into both eyes daily.     No current facility-administered medications for this visit.    Past Medical History:  Diagnosis Date   Anxiety    DM type 2 (diabetes mellitus, type 2) (HCC)    GERD (gastroesophageal reflux disease)    Heart murmur    saw dr Willeen Cass 06-29-2019 echo done    Hypercholesterolemia    Hypertension    Palpitations    none in a long time as of 10-19-2019   Pancreatitis    S/P colonoscopy 09/2009   Dr. Arnoldo Morale: Moderate diverticulosis throughout colon, otherwise normal    Umbilical hernia     Past Surgical History:  Procedure Laterality Date   COLONOSCOPY N/A 12/21/2019   Procedure: COLONOSCOPY;  Surgeon: Aviva Signs, MD;  Location: [facility];  Service: Gastroenterology;  Laterality: N/A;   cyst removed from finger  age 78   ESOPHAGOGASTRODUODENOSCOPY (EGD) WITH PROPOFOL N/A 08/13/2021   Procedure: ESOPHAGOGASTRODUODENOSCOPY (EGD) WITH PROPOFOL;  Surgeon: Eloise Harman, DO;  Location: [facility];  Service: Endoscopy;  Laterality: N/A;  1:30pm   HERNIA REPAIR     as baby   POLYPECTOMY  12/21/2019   Procedure: POLYPECTOMY;  Surgeon: Aviva Signs, MD;  Location: [facility];  Service: Gastroenterology;;   pyloric stenosis repair     as baby, operated at [facility] 10/29/2019   Procedure: [place];  Surgeon: Clovis Riley, MD;  Location: [facility];  Service: General;  Laterality: N/A;    Family History  Problem Relation Age of Onset   Colon cancer Neg Hx     Allergies as of 04/10/2022   (No Known Allergies)    Social History   Socioeconomic History   Marital status: Married    Spouse name: Not on file   Number of children: 2   Years of education: Not on file   Highest education level: Not on file  Occupational History   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[employer]    Comment: [employer]  Tobacco Use   Smoking status: Every Day    Packs/day: 1.50    Years: 30.00    Total pack years: 45.00    Types: Cigarettes    Passive exposure: Current   Smokeless tobacco: Never   Tobacco comments:    1 ppd since 3 weeks as of 10-19-2019  Vaping Use   Vaping Use: Never used  Substance and Sexual Activity   Alcohol use: Not Currently    Comment: quit 05/30/21   Drug use: Not Currently   Sexual activity: Not on file  Other Topics Concern   Not on file  Social History Narrative   Not on file   Social Determinants of Health   Financial Resource Strain: Not on file  Food Insecurity: No Food Insecurity (01/02/2022)   Hunger Vital Sign    Worried About Running Out of Food in the Last Year: Never true    [place] in the Last Year: Never true  Transportation Needs: No Transportation Needs (01/02/2022)   PRAPARE - Hydrologist (Medical): No    Lack of Transportation (Non-Medical): No  Physical Activity: Not on file  Stress: Not on file  Social Connections: Not on  file     Review of Systems   Gen: Denies fever, chills, anorexia. Denies fatigue, weakness, weight loss.  CV: Denies chest pain, palpitations, syncope, peripheral edema, and claudication. Resp: Denies dyspnea at rest, cough, wheezing, coughing up blood, and pleurisy. GI: See HPI Derm: Denies rash, itching, dry skin Psych: Denies depression, anxiety, memory loss, confusion. No homicidal or suicidal ideation.  Heme: Denies bruising, bleeding, and enlarged lymph nodes.   Physical Exam   BP (!) 151/72   Pulse 73   Temp 97.9 F (36.6 C)   Ht '5\' 2"'$  (1.575 m)   Wt 109 lb 9.6 oz (49.7 kg)   BMI 20.05 kg/m   General:   Alert and oriented. No distress noted. Pleasant and cooperative.  Head:  Normocephalic and atraumatic. Eyes:  Conjuctiva clear without scleral icterus. Mouth:  Oral mucosa pink and moist. Good dentition. No lesions. Lungs:  Clear to auscultation bilaterally. No wheezes, rales, or rhonchi. No distress.  Heart:  S1, S2 present without murmurs appreciated.  Abdomen:  +BS, soft, non-distended. Mild ttp to LUQ and LLQ. No rebound or guarding. No HSM or masses noted. Rectal: deferred Msk:  Symmetrical without gross deformities. Normal posture. Extremities:  Without edema. Neurologic:  Alert and  oriented x4 Psych:  Alert and cooperative. Normal mood and affect.   Assessment  Kyle Giles is a 62 y.o. male with a history of chronic pancreatitis, hepatic steatosis, anxiety, HTN, diabetes presenting today for follow up.  Pancreatitis, pancreatic divisum: 2 prior hospitalizations for pancreatitis in 2023.  Most recent episode in November with CT evidence of likely pseudocyst in the  tail of the pancreas with coarse calcifications resenting chronic pancreatitis.  Denies any alcohol use since his first episode of pancreatitis in early 2023.  Workup negative for hypercalcemia and hypertriglyceridemia.  Prior imaging as outlined in HPI.  Most recent workup includes an MRI/MRCP for further evaluation of pancreas divisum at the recommendation of Dr. Rush Landmark.  Prior CT images showed increased amount of his pseudocyst therefore he is to undergo EUS with possible cyst gastrostomy.  He also see him regarding potential treatment options for pancreatic divisum.  Given this this could be etiology for his pancreatitis with possible alcohol contributing as well.  He has some dull intermittent epigastric/left upper quadrant pain that is usually self-limiting.  Most of his abdominal pain is related to his left inguinal hernia as stated below.  He is to continue on a low-fat diet.  There has been some concerns regarding weight loss management of malignancy.  Last colonoscopy in 2021 with single polyp removed from the rectum.  Order repeat labs at last office visit in January which was to be performed at PCP office.  Patient states he had blood work performed but have not received results.  Will request these.  Advised patient to monitor his weight and to drink at least 1 protein shake per day to supplement extra protein and calories to avoid further weight loss.  Will need further evaluation with possible repeat colonoscopy if weight loss continues.  Of note he does have good appetite, and denies any melena, BRBPR, nausea, or vomiting.  GERD: Controlled with Nexium 20 mg once daily.  Left lower quadrant pain, left inguinal hernia: Patient scheduled for hernia repair tentatively on 05/13/22.  Continues to have intermittent pain especially with frequent standing or walking.  Also with long periods of sitting.  Having some mild pain this morning due to frequent activity yesterday.  PLAN   Increase  protein  intake with at least one nutrition shake per day. Eat 4-5 smaller meals a day or at least 1-2 snacks between meals.  Continue Nexium 20 mg once daily Keep follow up surgery for inguinal hernia repair. Low fat diet Follow up in 2-3  months Monitor weights. Re request previous labs from Dr. Delanna Ahmadi office.   Per review of records box patient had testosterone, PSA, TSH, and vitamin D levels 24 with TSH 1.56, vitamin D 43.  It does not appear that CBC, iron panel, B12 and folate panel, vitamin A, E, or K was obtained.  Will reach out to PCP office to see if these were performed and if not may need these Giles forward to further assess his weight loss.   Venetia Night, MSN, FNP-BC, AGACNP-BC [facility]

## 2022-04-10 ENCOUNTER — Ambulatory Visit (INDEPENDENT_AMBULATORY_CARE_PROVIDER_SITE_OTHER): Payer: Commercial Managed Care - PPO | Admitting: Gastroenterology

## 2022-04-10 ENCOUNTER — Telehealth: Payer: Self-pay | Admitting: Gastroenterology

## 2022-04-10 ENCOUNTER — Encounter: Payer: Self-pay | Admitting: Gastroenterology

## 2022-04-10 VITALS — BP 151/72 | HR 73 | Temp 97.9°F | Ht 62.0 in | Wt 109.6 lb

## 2022-04-10 DIAGNOSIS — K861 Other chronic pancreatitis: Secondary | ICD-10-CM | POA: Diagnosis not present

## 2022-04-10 DIAGNOSIS — Q453 Other congenital malformations of pancreas and pancreatic duct: Secondary | ICD-10-CM | POA: Diagnosis not present

## 2022-04-10 DIAGNOSIS — K219 Gastro-esophageal reflux disease without esophagitis: Secondary | ICD-10-CM | POA: Diagnosis not present

## 2022-04-10 DIAGNOSIS — K863 Pseudocyst of pancreas: Secondary | ICD-10-CM | POA: Diagnosis not present

## 2022-04-10 DIAGNOSIS — E559 Vitamin D deficiency, unspecified: Secondary | ICD-10-CM | POA: Diagnosis not present

## 2022-04-10 DIAGNOSIS — K4090 Unilateral inguinal hernia, without obstruction or gangrene, not specified as recurrent: Secondary | ICD-10-CM | POA: Diagnosis not present

## 2022-04-10 NOTE — Progress Notes (Signed)
[facility] History and Physical  Reason for Referral: Left inguinal hernia Referring Physician: Venetia Night, NP  Chief Complaint   New Patient (Initial Visit); Hernia     Kyle Giles is a 62 y.o. male.  HPI: Patient presents for evaluation of the left inguinal hernia.  He first noticed a lump in his left groin in October 2023.  Since that time, it has increased in size and it is starting to cause him more pain.  He particularly notes pain with heavy lifting.  He is tolerating a diet without nausea and vomiting.  And moving his bowels without issue.  His past medical history significant for hypertension, diabetes, and recently diagnosed pancreatic divisum with recurrent pancreatitis.  He is scheduled to undergo an EUS with cystogastrostomy with Dr. Rush Landmark on 3/28.  His surgical history is significant for an umbilical hernia repair with mesh.  He smokes a pack of cigarettes per week.  Denies any use of alcohol and illicit drugs.  He denies use of blood thinning medications.  Past Medical History:  Diagnosis Date   Anxiety    DM type 2 (diabetes mellitus, type 2) (HCC)    GERD (gastroesophageal reflux disease)    Heart murmur    saw dr Willeen Cass 06-29-2019 echo done    Hypercholesterolemia    Hypertension    Palpitations    none in a long time as of 10-19-2019   Pancreatitis    S/P colonoscopy 09/2009   Dr. Arnoldo Morale: Moderate diverticulosis throughout colon, otherwise normal    Umbilical hernia     Past Surgical History:  Procedure Laterality Date   COLONOSCOPY N/A 12/21/2019   Procedure: COLONOSCOPY;  Surgeon: Aviva Signs, MD;  [facility];  Service: Gastroenterology;  Laterality: N/A;   cyst removed from finger  age 70   ESOPHAGOGASTRODUODENOSCOPY (EGD) WITH PROPOFOL N/A 08/13/2021   Procedure: ESOPHAGOGASTRODUODENOSCOPY (EGD) WITH PROPOFOL;  Surgeon: Eloise Harman, DO;  [facility];  Service: Endoscopy;  Laterality: N/A;   1:30pm   HERNIA REPAIR     as baby   POLYPECTOMY  12/21/2019   Procedure: POLYPECTOMY;  Surgeon: Aviva Signs, MD;  [facility];  Service: Gastroenterology;;   pyloric stenosis repair     as baby, operated at [place] 10/29/2019   Procedure: OPEN UMBILICAL [place];  Surgeon: Clovis Riley, MD;  Location: [facility];  Service: General;  Laterality: N/A;    Family History  Problem Relation Age of Onset   Colon cancer Neg Hx     Social History   Tobacco Use   Smoking status: Every Day    Packs/day: 1.50    Years: 30.00    Total pack years: 45.00    Types: Cigarettes    Passive exposure: Current   Smokeless tobacco: Never   Tobacco comments:    1 ppd since 3 weeks as of 10-19-2019  Vaping Use   Vaping Use: Never used  Substance Use Topics   Alcohol use: Not Currently    Comment: quit 05/30/21   Drug use: Not Currently    Medications: I have reviewed the patient's current medications. Allergies as of 04/09/2022   No Known Allergies      Medication List        Accurate as of April 09, 2022 11:59 PM. If you have any questions, ask your nurse or doctor.  ALPRAZolam 1 MG tablet Commonly known as: Xanax Take 1 tablet (1 mg total) by mouth 3 (three) times daily.   atorvastatin 10 MG tablet Commonly known as: LIPITOR Take 1 tablet (10 mg total) by mouth daily.   cetirizine 10 MG tablet Commonly known as: ZYRTEC Take 1 tablet (10 mg total) by mouth daily.   cholecalciferol 25 MCG (1000 UNIT) tablet Commonly known as: VITAMIN D3 Take 1,000 Units by mouth 2 (two) times daily.   diltiazem 360 MG 24 hr capsule Commonly known as: CARDIZEM CD Take 1 capsule (360 mg total) by mouth every evening.   esomeprazole 20 MG capsule Commonly known as: NEXIUM Take 1 capsule (20 mg total) by mouth in the morning.   fenofibrate 160 MG tablet Take 1 tablet (160 mg total) by mouth daily.    metFORMIN 500 MG tablet Commonly known as: GLUCOPHAGE Take 0.5 tablets (250 mg total) by mouth 2 (two) times daily.   olmesartan-hydrochlorothiazide 40-25 MG tablet Commonly known as: BENICAR HCT Take 1 tablet by mouth daily.   Systane Complete 0.6 % Soln Generic drug: Propylene Glycol Place 1 drop into both eyes daily.         ROS:  Constitutional: negative for chills, fatigue, and fevers Eyes: negative for visual disturbance and pain Ears, nose, mouth, throat, and face: negative for ear drainage, sore throat, and sinus problems Respiratory: negative for cough, wheezing, and shortness of breath Cardiovascular: negative for chest pain and palpitations Gastrointestinal: positive for abdominal pain and reflux symptoms, negative for nausea and vomiting Genitourinary:negative for dysuria and frequency Integument/breast: negative for dryness and rash Hematologic/lymphatic: negative for bleeding and lymphadenopathy Musculoskeletal:negative for back pain and neck pain Neurological: negative for dizziness and tremors Endocrine: negative for temperature intolerance  Blood pressure (!) 146/77, pulse 74, temperature 98 F (36.7 C), temperature source Oral, resp. rate 12, height '5\' 2"'$  (1.575 m), weight 115 lb (52.2 kg), SpO2 97 %. Physical Exam Vitals reviewed.  Constitutional:      Appearance: Normal appearance.  HENT:     Head: Normocephalic and atraumatic.  Eyes:     Extraocular Movements: Extraocular movements intact.     Pupils: Pupils are equal, round, and reactive to light.  Cardiovascular:     Rate and Rhythm: Normal rate and regular rhythm.  Pulmonary:     Effort: Pulmonary effort is normal.     Breath sounds: Normal breath sounds.  Abdominal:     Comments: Abdomen soft, nondistended, no percussion tenderness, nontender to palpation; no rigidity, guarding, or rebound tenderness; soft and reducible left inguinal hernia, no palpable right inguinal hernia   Musculoskeletal:        General: Normal range of motion.     Cervical back: Normal range of motion.  Skin:    General: Skin is warm and dry.  Neurological:     General: No focal deficit present.     Mental Status: He is alert and oriented to person, place, and time.  Psychiatric:        Mood and Affect: Mood normal.        Behavior: Behavior normal.     Results: No results found for this or any previous visit (from the past 48 hour(s)).  No results found.   Assessment & Plan:  Kyle Giles is a 62 y.o. male who presents for evaluation of left inguinal hernia.  -I explained the pathophysiology of hernias, and why we recommend surgical repair -The risk and benefits of robotic assisted laparoscopic left  inguinal hernia repair with mesh were discussed including but not limited to bleeding, infection, injury to surrounding structures, need for additional procedures, and hernia recurrence.  After careful consideration, SHERON ORTT has decided to proceed with surgery. -Patient tentatively scheduled for surgery on 4/8 -I have reached out to Dr. Rush Landmark regarding timing after his EUS with cystogastrostomy.  He does not believe surgery a week after his procedure will be a problem, unless the patient requires necrosectomy.  In this situation, we would delay his inguinal hernia surgery -Information provided to the patient regarding inguinal hernias -Advised him to present to the emergency department if he begins to have a very tender nonreducible bulge in his groin, nausea, vomiting, and obstipation  All questions were answered to the satisfaction of the patient.  Graciella Freer, DO [facility] [address] [place], [place] [address] [phone] (office)

## 2022-04-10 NOTE — Telephone Encounter (Signed)
Please reach out to Dr. Delanna Ahmadi office to request patient's most recent lab work.  I have provided lab slips to him to have CBC, iron panel, B12 and folate, and vitamin A, E, and K checked.  Patient reported he took his last exposure appointment and I do not have any of these results.  Can we check to see if any of these were performed by his office by requesting labs.  Venetia Night, MSN, APRN, FNP-BC, AGACNP-BC [facility]

## 2022-04-10 NOTE — Patient Instructions (Addendum)
Continue Nexium 20 mg once daily  Continue to follow a low-fat diet.  Keep your follow-up with Dr. Rush Landmark for your procedure at the end of this month.  Await for recommendations from Dr. Mamie Nick regarding your inguinal hernia repair and timing.  To help maintain weight I would recommend that you increase your protein intake with at least 1 nutritional shake per day.  He also may benefit from eating smaller meals more frequently to increase your calorie intake.  Shake with at least 25 g of protein would be beneficial.  Start with 1/day and if needed can increase to 2.  I will plan to see you in 2-3 months.  If any more significant weight loss please notify the office.  It was a pleasure to see you today. I want to create trusting relationships with patients. If you receive a survey regarding your visit,  I greatly appreciate you taking time to fill this out on paper or through your MyChart. I value your feedback.  Venetia Night, MSN, FNP-BC, AGACNP-BC [facility]

## 2022-04-11 NOTE — Telephone Encounter (Signed)
Spoke to pt, informed him of results and recommendations. Pt voiced understanding. Pt states he is trying to drink protein shakes.

## 2022-04-11 NOTE — Telephone Encounter (Signed)
Requested labs are in your box for review.

## 2022-04-11 NOTE — H&P (Signed)
[facility] History and Physical   Reason for Referral: Left inguinal hernia Referring Physician: Venetia Night, NP   Chief Complaint   New Patient (Initial Visit); Hernia        Kyle Giles is a 62 y.o. male.  HPI: Patient presents for evaluation of the left inguinal hernia.  He first noticed a lump in his left groin in October 2023.  Since that time, it has increased in size and it is starting to cause him more pain.  He particularly notes pain with heavy lifting.  He is tolerating a diet without nausea and vomiting.  And moving his bowels without issue.  His past medical history significant for hypertension, diabetes, and recently diagnosed pancreatic divisum with recurrent pancreatitis.  He is scheduled to undergo an EUS with cystogastrostomy with Dr. Rush Landmark on 3/28.  His surgical history is significant for an umbilical hernia repair with mesh.  He smokes a pack of cigarettes per week.  Denies any use of alcohol and illicit drugs.  He denies use of blood thinning medications.       Past Medical History:  Diagnosis Date   Anxiety     DM type 2 (diabetes mellitus, type 2) (HCC)     GERD (gastroesophageal reflux disease)     Heart murmur      saw dr Willeen Cass 06-29-2019 echo done    Hypercholesterolemia     Hypertension     Palpitations      none in a long time as of 10-19-2019   Pancreatitis     S/P colonoscopy 09/2009    Dr. Arnoldo Morale: Moderate diverticulosis throughout colon, otherwise normal    Umbilical hernia             Past Surgical History:  Procedure Laterality Date   COLONOSCOPY N/A 12/21/2019    Procedure: COLONOSCOPY;  Surgeon: Aviva Signs, MD;  [facility];  Service: Gastroenterology;  Laterality: N/A;   cyst removed from finger   age 69   ESOPHAGOGASTRODUODENOSCOPY (EGD) WITH PROPOFOL N/A 08/13/2021    Procedure: ESOPHAGOGASTRODUODENOSCOPY (EGD) WITH PROPOFOL;  Surgeon: Eloise Harman, DO;  [facility];   Service: Endoscopy;  Laterality: N/A;  1:30pm   HERNIA REPAIR        as baby   POLYPECTOMY   12/21/2019    Procedure: POLYPECTOMY;  Surgeon: Aviva Signs, MD;  [facility];  Service: Gastroenterology;;   pyloric stenosis repair        as baby, operated at [place] 10/29/2019    Procedure: OPEN UMBILICAL [place];  Surgeon: Clovis Riley, MD;  Location: [facility];  Service: General;  Laterality: N/A;           Family History  Problem Relation Age of Onset   Colon cancer Neg Hx        Social History         Tobacco Use   Smoking status: Every Day      Packs/day: 1.50      Years: 30.00      Total pack years: 45.00      Types: Cigarettes      Passive exposure: Current   Smokeless tobacco: Never   Tobacco comments:      1 ppd since 3 weeks as of 10-19-2019  Vaping Use   Vaping Use: Never used  Substance Use Topics   Alcohol use: Not Currently  Comment: quit 05/30/21   Drug use: Not Currently      Medications: I have reviewed the patient's current medications. Allergies as of 04/09/2022   No Known Allergies         Medication List           Accurate as of April 09, 2022 11:59 PM. If you have any questions, ask your nurse or doctor.              ALPRAZolam 1 MG tablet Commonly known as: Xanax Take 1 tablet (1 mg total) by mouth 3 (three) times daily.    atorvastatin 10 MG tablet Commonly known as: LIPITOR Take 1 tablet (10 mg total) by mouth daily.    cetirizine 10 MG tablet Commonly known as: ZYRTEC Take 1 tablet (10 mg total) by mouth daily.    cholecalciferol 25 MCG (1000 UNIT) tablet Commonly known as: VITAMIN D3 Take 1,000 Units by mouth 2 (two) times daily.    diltiazem 360 MG 24 hr capsule Commonly known as: CARDIZEM CD Take 1 capsule (360 mg total) by mouth every evening.    esomeprazole 20 MG capsule Commonly known as: NEXIUM Take 1 capsule (20 mg total) by mouth in  the morning.    fenofibrate 160 MG tablet Take 1 tablet (160 mg total) by mouth daily.    metFORMIN 500 MG tablet Commonly known as: GLUCOPHAGE Take 0.5 tablets (250 mg total) by mouth 2 (two) times daily.    olmesartan-hydrochlorothiazide 40-25 MG tablet Commonly known as: BENICAR HCT Take 1 tablet by mouth daily.    Systane Complete 0.6 % Soln Generic drug: Propylene Glycol Place 1 drop into both eyes daily.               ROS:  Constitutional: negative for chills, fatigue, and fevers Eyes: negative for visual disturbance and pain Ears, nose, mouth, throat, and face: negative for ear drainage, sore throat, and sinus problems Respiratory: negative for cough, wheezing, and shortness of breath Cardiovascular: negative for chest pain and palpitations Gastrointestinal: positive for abdominal pain and reflux symptoms, negative for nausea and vomiting Genitourinary:negative for dysuria and frequency Integument/breast: negative for dryness and rash Hematologic/lymphatic: negative for bleeding and lymphadenopathy Musculoskeletal:negative for back pain and neck pain Neurological: negative for dizziness and tremors Endocrine: negative for temperature intolerance   Blood pressure (!) 146/77, pulse 74, temperature 98 F (36.7 C), temperature source Oral, resp. rate 12, height '5\' 2"'$  (1.575 m), weight 115 lb (52.2 kg), SpO2 97 %. Physical Exam Vitals reviewed.  Constitutional:      Appearance: Normal appearance.  HENT:     Head: Normocephalic and atraumatic.  Eyes:     Extraocular Movements: Extraocular movements intact.     Pupils: Pupils are equal, round, and reactive to light.  Cardiovascular:     Rate and Rhythm: Normal rate and regular rhythm.  Pulmonary:     Effort: Pulmonary effort is normal.     Breath sounds: Normal breath sounds.  Abdominal:     Comments: Abdomen soft, nondistended, no percussion tenderness, nontender to palpation; no rigidity, guarding, or rebound  tenderness; soft and reducible left inguinal hernia, no palpable right inguinal hernia  Musculoskeletal:        General: Normal range of motion.     Cervical back: Normal range of motion.  Skin:    General: Skin is warm and dry.  Neurological:     General: No focal deficit present.     Mental Status: He is alert  and oriented to person, place, and time.  Psychiatric:        Mood and Affect: Mood normal.        Behavior: Behavior normal.        Results: Lab Results Last 48 Hours  No results found for this or any previous visit (from the past 48 hour(s)).     Imaging Results (Last 48 hours)  No results found.       Assessment & Plan:  Kyle Giles is a 62 y.o. male who presents for evaluation of left inguinal hernia.   -I explained the pathophysiology of hernias, and why we recommend surgical repair -The risk and benefits of robotic assisted laparoscopic left inguinal hernia repair with mesh were discussed including but not limited to bleeding, infection, injury to surrounding structures, need for additional procedures, and hernia recurrence.  After careful consideration, Kyle Giles has decided to proceed with surgery. -Patient tentatively scheduled for surgery on 4/8 -I have reached out to Dr. Rush Landmark regarding timing after his EUS with cystogastrostomy.  He does not believe surgery a week after his procedure will be a problem, unless the patient requires necrosectomy.  In this situation, we would delay his inguinal hernia surgery -Information provided to the patient regarding inguinal hernias -Advised him to present to the emergency department if he begins to have a very tender nonreducible bulge in his groin, nausea, vomiting, and obstipation   All questions were answered to the satisfaction of the patient.   Graciella Freer, DO [facility] [address] [place], [place] [address] [phone] (office)

## 2022-04-17 ENCOUNTER — Other Ambulatory Visit: Payer: Self-pay

## 2022-04-22 ENCOUNTER — Other Ambulatory Visit: Payer: Self-pay

## 2022-04-22 ENCOUNTER — Other Ambulatory Visit (HOSPITAL_COMMUNITY): Payer: Self-pay

## 2022-04-23 ENCOUNTER — Other Ambulatory Visit: Payer: Self-pay

## 2022-04-25 ENCOUNTER — Encounter (HOSPITAL_COMMUNITY): Payer: Self-pay | Admitting: Gastroenterology

## 2022-05-02 ENCOUNTER — Other Ambulatory Visit: Payer: Self-pay

## 2022-05-02 ENCOUNTER — Encounter (HOSPITAL_COMMUNITY): Payer: Self-pay | Admitting: Gastroenterology

## 2022-05-02 ENCOUNTER — Encounter (HOSPITAL_COMMUNITY)
Admission: RE | Disposition: A | Discharge status: Home / Self Care | Payer: Self-pay | Source: Home / Self Care | Attending: Gastroenterology

## 2022-05-02 ENCOUNTER — Ambulatory Visit (HOSPITAL_COMMUNITY)
Admission: RE | Admit: 2022-05-02 | Discharge: 2022-05-02 | Disposition: A | Discharge status: Home / Self Care | Payer: Commercial Managed Care - PPO | Attending: Gastroenterology | Admitting: Gastroenterology

## 2022-05-02 ENCOUNTER — Ambulatory Visit (HOSPITAL_BASED_OUTPATIENT_CLINIC_OR_DEPARTMENT_OTHER): Payer: Commercial Managed Care - PPO | Admitting: Anesthesiology

## 2022-05-02 ENCOUNTER — Ambulatory Visit (HOSPITAL_COMMUNITY): Payer: Commercial Managed Care - PPO | Admitting: Anesthesiology

## 2022-05-02 DIAGNOSIS — Z7984 Long term (current) use of oral hypoglycemic drugs: Secondary | ICD-10-CM | POA: Insufficient documentation

## 2022-05-02 DIAGNOSIS — R634 Abnormal weight loss: Secondary | ICD-10-CM | POA: Insufficient documentation

## 2022-05-02 DIAGNOSIS — I899 Noninfective disorder of lymphatic vessels and lymph nodes, unspecified: Secondary | ICD-10-CM

## 2022-05-02 DIAGNOSIS — K219 Gastro-esophageal reflux disease without esophagitis: Secondary | ICD-10-CM | POA: Insufficient documentation

## 2022-05-02 DIAGNOSIS — R6881 Early satiety: Secondary | ICD-10-CM | POA: Diagnosis not present

## 2022-05-02 DIAGNOSIS — R1013 Epigastric pain: Secondary | ICD-10-CM | POA: Diagnosis not present

## 2022-05-02 DIAGNOSIS — Q453 Other congenital malformations of pancreas and pancreatic duct: Secondary | ICD-10-CM

## 2022-05-02 DIAGNOSIS — F17210 Nicotine dependence, cigarettes, uncomplicated: Secondary | ICD-10-CM | POA: Insufficient documentation

## 2022-05-02 DIAGNOSIS — I10 Essential (primary) hypertension: Secondary | ICD-10-CM | POA: Insufficient documentation

## 2022-05-02 DIAGNOSIS — K859 Acute pancreatitis without necrosis or infection, unspecified: Secondary | ICD-10-CM

## 2022-05-02 DIAGNOSIS — Z681 Body mass index (BMI) 19 or less, adult: Secondary | ICD-10-CM | POA: Diagnosis not present

## 2022-05-02 DIAGNOSIS — F17200 Nicotine dependence, unspecified, uncomplicated: Secondary | ICD-10-CM | POA: Diagnosis not present

## 2022-05-02 DIAGNOSIS — K3189 Other diseases of stomach and duodenum: Secondary | ICD-10-CM

## 2022-05-02 DIAGNOSIS — K838 Other specified diseases of biliary tract: Secondary | ICD-10-CM

## 2022-05-02 DIAGNOSIS — E119 Type 2 diabetes mellitus without complications: Secondary | ICD-10-CM | POA: Diagnosis not present

## 2022-05-02 DIAGNOSIS — K862 Cyst of pancreas: Secondary | ICD-10-CM

## 2022-05-02 DIAGNOSIS — Z79899 Other long term (current) drug therapy: Secondary | ICD-10-CM | POA: Diagnosis not present

## 2022-05-02 DIAGNOSIS — K863 Pseudocyst of pancreas: Secondary | ICD-10-CM

## 2022-05-02 DIAGNOSIS — K869 Disease of pancreas, unspecified: Secondary | ICD-10-CM | POA: Diagnosis not present

## 2022-05-02 DIAGNOSIS — F419 Anxiety disorder, unspecified: Secondary | ICD-10-CM | POA: Diagnosis not present

## 2022-05-02 HISTORY — PX: BALLOON DILATION: SHX5330

## 2022-05-02 HISTORY — PX: EUS: SHX5427

## 2022-05-02 HISTORY — PX: PANCREATIC STENT PLACEMENT: SHX5539

## 2022-05-02 HISTORY — PX: BIOPSY: SHX5522

## 2022-05-02 HISTORY — PX: ESOPHAGOGASTRODUODENOSCOPY (EGD) WITH PROPOFOL: SHX5813

## 2022-05-02 LAB — GLUCOSE, CAPILLARY
Glucose-Capillary: 112 mg/dL — ABNORMAL HIGH (ref 70–99)
Glucose-Capillary: 169 mg/dL — ABNORMAL HIGH (ref 70–99)

## 2022-05-02 SURGERY — UPPER ENDOSCOPIC ULTRASOUND (EUS) RADIAL
Anesthesia: Monitor Anesthesia Care

## 2022-05-02 MED ORDER — PROPOFOL 10 MG/ML IV BOLUS
INTRAVENOUS | Status: DC | PRN
  Administered 2022-05-02: 150 mg via INTRAVENOUS

## 2022-05-02 MED ORDER — CIPROFLOXACIN IN D5W 400 MG/200ML IV SOLN
INTRAVENOUS | Status: AC
  Filled 2022-05-02: qty 200

## 2022-05-02 MED ORDER — LIDOCAINE 2% (20 MG/ML) 5 ML SYRINGE
INTRAMUSCULAR | Status: DC | PRN
  Administered 2022-05-02: 60 mg via INTRAVENOUS

## 2022-05-02 MED ORDER — ONDANSETRON HCL 4 MG/2ML IJ SOLN
INTRAMUSCULAR | Status: DC | PRN
  Administered 2022-05-02: 4 mg via INTRAVENOUS

## 2022-05-02 MED ORDER — CIPROFLOXACIN IN D5W 400 MG/200ML IV SOLN
INTRAVENOUS | Status: DC | PRN
  Administered 2022-05-02: 400 mg via INTRAVENOUS

## 2022-05-02 MED ORDER — SUGAMMADEX SODIUM 200 MG/2ML IV SOLN
INTRAVENOUS | Status: DC | PRN
  Administered 2022-05-02: 200 mg via INTRAVENOUS

## 2022-05-02 MED ORDER — ROCURONIUM BROMIDE 10 MG/ML (PF) SYRINGE
PREFILLED_SYRINGE | INTRAVENOUS | Status: DC | PRN
  Administered 2022-05-02: 50 mg via INTRAVENOUS

## 2022-05-02 MED ORDER — DEXAMETHASONE SODIUM PHOSPHATE 10 MG/ML IJ SOLN
INTRAMUSCULAR | Status: DC | PRN
  Administered 2022-05-02: 10 mg via INTRAVENOUS

## 2022-05-02 MED ORDER — SODIUM CHLORIDE 0.9 % IV SOLN: INTRAVENOUS | Status: DC

## 2022-05-02 MED ORDER — FENTANYL CITRATE (PF) 100 MCG/2ML IJ SOLN
INTRAMUSCULAR | Status: AC
  Filled 2022-05-02: qty 2

## 2022-05-02 MED ORDER — PROPOFOL 10 MG/ML IV BOLUS
INTRAVENOUS | Status: AC
  Filled 2022-05-02: qty 20

## 2022-05-02 MED ORDER — FENTANYL CITRATE (PF) 100 MCG/2ML IJ SOLN
INTRAMUSCULAR | Status: DC | PRN
  Administered 2022-05-02 (×2): 100 ug via INTRAVENOUS

## 2022-05-02 MED ORDER — LACTATED RINGERS IV SOLN: INTRAVENOUS | Status: DC

## 2022-05-02 NOTE — Discharge Instructions (Signed)
YOU HAD AN ENDOSCOPIC PROCEDURE TODAY: Refer to the procedure report and other information in the discharge instructions given to you for any specific questions about what was found during the examination. If this information does not answer your questions, please call [place] office at [phone] to clarify.  ° °YOU SHOULD EXPECT: Some feelings of bloating in the abdomen. Passage of more gas than usual. Walking can help get rid of the air that was put into your GI tract during the procedure and reduce the bloating.  ° °DIET: Your first meal following the procedure should be a light meal and then it is ok to progress to your normal diet. A half-sandwich or bowl of soup is an example of a good first meal. Heavy or fried foods are harder to digest and may make you feel nauseous or bloated. Drink plenty of fluids but you should avoid alcoholic beverages for 24 hours. ° °ACTIVITY: Your care partner should take you home directly after the procedure. You should plan to take it easy, moving slowly for the rest of the day. You can resume normal activity the day after the procedure however YOU SHOULD NOT DRIVE, use power tools, machinery or perform tasks that involve climbing or major physical exertion for 24 hours (because of the sedation medicines used during the test).  ° °SYMPTOMS TO REPORT IMMEDIATELY: °A gastroenterologist can be reached at any hour. Please call [phone]  for any of the following symptoms:  ° °Following upper endoscopy (EGD, EUS, ERCP, esophageal dilation) °Vomiting of blood or coffee ground material  °New, significant abdominal pain  °New, significant chest pain or pain under the shoulder blades  °Painful or persistently difficult swallowing  °New shortness of breath  °Black, tarry-looking or red, bloody stools ° °FOLLOW UP:  °If any biopsies were taken you will be contacted by phone or by letter within the next 1-3 weeks. Call [phone]  if you have not heard about the biopsies in 3 weeks.    °Please also call with any specific questions about appointments or follow up tests. ° °

## 2022-05-02 NOTE — Anesthesia Preprocedure Evaluation (Addendum)
Anesthesia Evaluation  Patient identified by MRN, date of birth, ID band Patient awake    Reviewed: Allergy & Precautions, NPO status , Patient's Chart, lab work & pertinent test results  Airway Mallampati: II  TM Distance: >3 FB Neck ROM: Full    Dental no notable dental hx.    Pulmonary Current Smoker   Pulmonary exam normal        Cardiovascular hypertension, Pt. on medications  Rhythm:Regular Rate:Normal     Neuro/Psych   Anxiety     negative neurological ROS     GI/Hepatic Neg liver ROS,GERD  Medicated,,Pancreatitis    Endo/Other  diabetes, Type 2, Oral Hypoglycemic Agents    Renal/GU   negative genitourinary   Musculoskeletal negative musculoskeletal ROS (+)    Abdominal Normal abdominal exam  (+)   Peds  Hematology negative hematology ROS (+)   Anesthesia Other Findings   Reproductive/Obstetrics                             Anesthesia Physical Anesthesia Plan  ASA: 3  Anesthesia Plan: General   Post-op Pain Management:    Induction: Intravenous  PONV Risk Score and Plan: Propofol infusion, Treatment may vary due to age or medical condition, Ondansetron and Dexamethasone  Airway Management Planned: Mask and Oral ETT  Additional Equipment: None  Intra-op Plan:   Post-operative Plan: Extubation in OR  Informed Consent: I have reviewed the patients History and Physical, chart, labs and discussed the procedure including the risks, benefits and alternatives for the proposed anesthesia with the patient or authorized representative who has indicated his/her understanding and acceptance.     Dental advisory given  Plan Discussed with: CRNA  Anesthesia Plan Comments:        Anesthesia Quick Evaluation

## 2022-05-02 NOTE — H&P (Signed)
GASTROENTEROLOGY PROCEDURE H&P NOTE   Primary Care Physician: Sharilyn Sites, MD  HPI: Kyle Giles is a 62 y.o. male who presents for EGD/EUS to evaluate pancreatic cystic lesion concerning for hemorrhagic pseudocyst that has been slowly decreasing in size based on imaging but patient with reported persisting and recurring abdominal pain for consideration of cystgastrostomy.  Etiology of patient's pancreatitis initially felt to be alcohol related, though he does have notation of pancreatic divisum on MRI/MRCP.  Past Medical History:  Diagnosis Date   Anxiety    DM type 2 (diabetes mellitus, type 2) (HCC)    GERD (gastroesophageal reflux disease)    Heart murmur    saw dr Willeen Cass 06-29-2019 echo done    Hypercholesterolemia    Hypertension    Palpitations    none in a long time as of 10-19-2019   Pancreatitis    S/P colonoscopy 09/2009   Dr. [facility] Morale: Moderate diverticulosis throughout colon, otherwise normal    Umbilical hernia    Past Surgical History:  Procedure Laterality Date   COLONOSCOPY N/A 12/21/2019   Procedure: COLONOSCOPY;  Surgeon: Aviva Signs, MD;  Location: [facility];  Service: Gastroenterology;  Laterality: N/A;   cyst removed from finger  age 62   ESOPHAGOGASTRODUODENOSCOPY (EGD) WITH PROPOFOL N/A 08/13/2021   Procedure: ESOPHAGOGASTRODUODENOSCOPY (EGD) WITH PROPOFOL;  Surgeon: Eloise Harman, DO;  Location: [facility];  Service: Endoscopy;  Laterality: N/A;  1:30pm   HERNIA REPAIR     as baby   POLYPECTOMY  12/21/2019   Procedure: POLYPECTOMY;  Surgeon: Aviva Signs, MD;  Location: [facility];  Service: Gastroenterology;;   pyloric stenosis repair     as baby, operated at [place] 10/29/2019   Procedure: [place];  Surgeon: Clovis Riley, MD;  Location: [facility];  Service: General;  Laterality: N/A;   No current facility-administered medications for this  encounter.   No current facility-administered medications for this encounter. No Known Allergies Family History  Problem Relation Age of Onset   Colon cancer Neg Hx    Social History   Socioeconomic History   Marital status: Married    Spouse name: Not on file   Number of children: 2   Years of education: Not on file   Highest education level: Not on file  Occupational History   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[employer]    Comment: [employer]  Tobacco Use   Smoking status: Every Day    Packs/day: 1.50    Years: 30.00    Additional pack years: 0.00    Total pack years: 45.00    Types: Cigarettes    Passive exposure: Current   Smokeless tobacco: Never   Tobacco comments:    1 ppd since 3 weeks as of 10-19-2019  Vaping Use   Vaping Use: Never used  Substance and Sexual Activity   Alcohol use: Not Currently    Comment: quit 05/30/21   Drug use: Not Currently   Sexual activity: Not on file  Other Topics Concern   Not on file  Social History Narrative   Not on file   Social Determinants of Health   Financial Resource Strain: Not on file  Food Insecurity: No Food Insecurity (01/02/2022)   Hunger Vital Sign    Worried About Running Out of Food in the Last Year: Never true    Ran Out of Food in the Last  Year: Never true  Transportation Needs: No Transportation Needs (01/02/2022)   PRAPARE - Hydrologist (Medical): No    Lack of Transportation (Non-Medical): No  Physical Activity: Not on file  Stress: Not on file  Social Connections: Not on file  Intimate Partner Violence: Not At Risk (01/02/2022)   Humiliation, Afraid, Rape, and Kick questionnaire    Fear of Current or Ex-Partner: No    Emotionally Abused: No    Physically Abused: No    Sexually Abused: No    Physical Exam: There were no vitals filed for this visit. There is no height or weight on file to calculate BMI. GEN: NAD EYE: Sclerae anicteric ENT:  MMM CV: Non-tachycardic GI: Soft, NT/ND NEURO:  Alert & Oriented x 3  Lab Results: No results for input(s): "WBC", "HGB", "HCT", "PLT" in the last 72 hours. BMET No results for input(s): "NA", "K", "CL", "CO2", "GLUCOSE", "BUN", "CREATININE", "CALCIUM" in the last 72 hours. LFT No results for input(s): "PROT", "ALBUMIN", "AST", "ALT", "ALKPHOS", "BILITOT", "BILIDIR", "IBILI" in the last 72 hours. PT/INR No results for input(s): "LABPROT", "INR" in the last 72 hours.   Impression / Plan: This is a 62 y.o.male who presents for EGD/EUS to evaluate pancreatic cystic lesion concerning for hemorrhagic pseudocyst that has been slowly decreasing in size based on imaging but patient with reported persisting and recurring abdominal pain for consideration of cystgastrostomy.  Etiology of patient's pancreatitis initially felt to be alcohol related, though he does have notation of pancreatic divisum on MRI/MRCP.  The risks of an EUS including intestinal perforation, bleeding, infection, aspiration, and medication effects were discussed as was the possibility it may not give a definitive diagnosis if a biopsy is performed.  When a biopsy of the pancreas is done as part of the EUS, there is an additional risk of pancreatitis at the rate of about 1-2%.  It was explained that procedure related pancreatitis is typically mild, although it can be severe and even life threatening, which is why we do not perform random pancreatic biopsies and only biopsy a lesion/area we feel is concerning enough to warrant the risk.  The risks of an EUS, including intestinal perforation, bleeding, infection, aspiration, and medication effects were discussed.  When a cystgastrostomy/cystenterostomy is performed as part of the EUS, there is an additional risk of pancreatitis at the rate of about 1-2%.  It was explained that procedure related pancreatitis is typically mild, although at times it can be severe and even life  threatening.   The risks and benefits of endoscopic evaluation/treatment were discussed with the patient and/or family; these include but are not limited to the risk of perforation, infection, bleeding, missed lesions, lack of diagnosis, severe illness requiring hospitalization, as well as anesthesia and sedation related illnesses.  The patient's history has been reviewed, patient examined, no change in status, and deemed stable for procedure.  The patient and/or family is agreeable to proceed.    Justice Britain, MD [facility] # [id number]

## 2022-05-02 NOTE — Anesthesia Procedure Notes (Signed)
Procedure Name: Intubation Date/Time: 05/02/2022 2:19 PM  Performed by: Lind Covert, CRNAPre-anesthesia Checklist: Patient identified, Emergency Drugs available, Suction available, Patient being monitored and Timeout performed Patient Re-evaluated:Patient Re-evaluated prior to induction Oxygen Delivery Method: Circle system utilized Preoxygenation: Pre-oxygenation with 100% oxygen Induction Type: IV induction Ventilation: Mask ventilation without difficulty Laryngoscope Size: Mac and 3 Grade View: Grade I Tube type: Oral Tube size: 7.0 mm Number of attempts: 1 Airway Equipment and Method: Stylet Placement Confirmation: ETT inserted through vocal cords under direct vision, positive ETCO2 and breath sounds checked- equal and bilateral Secured at: 22 cm Tube secured with: Tape Dental Injury: Teeth and Oropharynx as per pre-operative assessment

## 2022-05-02 NOTE — Anesthesia Postprocedure Evaluation (Signed)
Anesthesia Post Note  Patient: Kyle Giles  Procedure(s) Performed: UPPER ENDOSCOPIC ULTRASOUND (EUS) RADIAL BIOPSY PANCREATIC STENT PLACEMENT BALLOON DILATION     Patient location during evaluation: PACU Anesthesia Type: General Level of consciousness: awake and alert Pain management: pain level controlled Vital Signs Assessment: post-procedure vital signs reviewed and stable Respiratory status: spontaneous breathing, nonlabored ventilation and respiratory function stable Cardiovascular status: blood pressure returned to baseline and stable Postop Assessment: no apparent nausea or vomiting Anesthetic complications: no   No notable events documented.  Last Vitals:  Vitals:   05/02/22 1550 05/02/22 1600  BP: (!) 153/59 (!) 162/65  Pulse: 88 86  Resp: 18 12  Temp:    SpO2: 95% 94%    Last Pain:  Vitals:   05/02/22 1600  TempSrc:   PainSc: 0-No pain                 Audry Pili

## 2022-05-02 NOTE — Transfer of Care (Signed)
Immediate Anesthesia Transfer of Care Note  Patient: Kyle Giles  Procedure(s) Performed: UPPER ENDOSCOPIC ULTRASOUND (EUS) RADIAL BIOPSY PANCREATIC STENT PLACEMENT BALLOON DILATION  Patient Location: PACU  Anesthesia Type:General  Level of Consciousness: sedated  Airway & Oxygen Therapy: Patient Spontanous Breathing and Patient connected to face mask oxygen  Post-op Assessment: Report given to RN and Post -op Vital signs reviewed and stable  Post vital signs: Reviewed and stable  Last Vitals:  Vitals Value Taken Time  BP 170/52 05/02/22 1537  Temp 36.9 C 05/02/22 1537  Pulse 81 05/02/22 1537  Resp 14 05/02/22 1537  SpO2 100 % 05/02/22 1537    Last Pain:  Vitals:   05/02/22 1537  TempSrc: Temporal  PainSc: 0-No pain         Complications: No notable events documented.

## 2022-05-02 NOTE — Op Note (Signed)
[facility] Patient Name: Kyle Giles Procedure Date: 05/02/2022 MRN: [id number] Attending MD: Justice Britain , MD, [id number] Date of Birth: January 09, 1961 CSN: [id number] Age: 62 Admit Type: Outpatient Procedure:                Upper EUS Indications:              Pancreatic cyst on CT scan, Pancreatic cyst on                            MRCP, Abnormal abdominal MRI, Acute recurrent                            pancreatitis, Epigastric abdominal pain, Early                            satiety, Weight loss Providers:                Justice Britain, MD, Jeanella Cara, RN,                            Cletis Athens, Technician Referring MD:             Elon Alas. Abbey Chatters, DO, Halford Chessman MD, MD, NP                            Mickle Mallory Medicines:                General Anesthesia Complications:            No immediate complications. Estimated Blood Loss:     Estimated blood loss was minimal. Procedure:                Pre-Anesthesia Assessment:                           - Prior to the procedure, a History and Physical                            was performed, and patient medications and                            allergies were reviewed. The patient's tolerance of                            previous anesthesia was also reviewed. The risks                            and benefits of the procedure and the sedation                            options and risks were discussed with the patient.                            All questions were answered, and informed consent  was obtained. Prior Anticoagulants: The patient has                            taken no anticoagulant or antiplatelet agents. ASA                            Grade Assessment: II - A patient with mild systemic                            disease. After reviewing the risks and benefits,                            the patient was deemed in satisfactory condition to                             undergo the procedure.                           After obtaining informed consent, the endoscope was                            passed under direct vision. Throughout the                            procedure, the patient's blood pressure, pulse, and                            oxygen saturations were monitored continuously. The                            [id number] Olympus endoscope was introduced                            through the mouth, and advanced to the second part                            of duodenum. The [id number] Olympus                            therapeutic endoscope was introduced through the                            mouth, and advanced to the second part of duodenum.                            The [id number] Olympus duodenoscope was                            introduced through the mouth, and advanced to the                            area of papilla. The [id number] Olympus  linear ultrasound scope was introduced through the                            mouth, and advanced to the duodenum for ultrasound                            examination from the stomach and duodenum. The                            upper EUS was accomplished without difficulty. The                            patient tolerated the procedure. Scope In: Scope Out: Findings:      ENDOSCOPIC FINDING: :      No gross lesions were noted in the entire esophagus.      The Z-line was regular and was found 40 cm from the incisors.      Patchy mildly erythematous mucosa without bleeding was found in the       gastric body.      No other gross lesions were noted in the entire examined stomach.       Biopsies were taken with a cold forceps for histology and Helicobacter       pylori testing.      No gross lesions were noted in the duodenal bulb.      A moderate deformity was found in the duodenal sweep. Not clear if this       is prior groove  pancreatitis and healed narrowing versus prior ulcer       that is healed (able to be traversed with both the ERCP duodena scope       and the linear echoendoscope).      The major papilla was normal in appearance.      Diffuse moderately congested mucosa without active bleeding and with no       stigmata of bleeding was found in the area of the minor papilla, with       what appeared to be the potential minor papula although it was much more       proximal than I would have expected.      ENDOSONOGRAPHIC FINDING: :      A hypoechoic lesion suggestive of a cyst was identified in the       pancreatic body and pancreatic tail. The lesion measured 65 mm by 50 mm       in maximal cross-sectional diameter. There was a single compartment       without septae. The outer wall of the lesion was thin. There was no       associated mass. There was no internal debris within the fluid-filled       cavity. The decision was made to create a cystogastrostomy using the       AXIOS stent system. Once an appropriate position in the stomach was       identified, the common wall between the stomach and the cyst was       interrogated utilizing color Doppler imaging to identify interposed       vessels. The AXIOS stent and electrocautery device were introduced       through the working channel. The AXIOS catheter was advanced to the  common wall between the stomach and the cyst. Current was applied to the       cautery tip and then the catheter was advanced into the cyst. A 15 x 10       mm AXIOS stent was placed with the flanges in close approximation to the       walls of the cyst and the stomach through the cystogastrostomy. The       stent was successfully placed. An 8 to 10 mm CRE balloon was used to       dilate the AXIOS tract up to 10 mm. Suction via Endoscope was performed       and we removed nearly 400 cc of fluid. A 5 cm 7 Fr double pigtail stent       was placed through the AXIOS stent into  the pseudocyst using a stent       introducer set. The stent was successfully placed. Looking into the cyst       cavity the cyst wall looked to be well-vascularized.      The pancreatic duct had a dilated endosonographic appearance in the       pancreatic head (3.8 mm) the rest of the pancreatic duct was obscured as       result of the pseudocyst. The patient has evidence of what appears to be       complete pancreatic divisum      Two hyperechoic foci measuring up to two mm in diameter suggestive of       stones were found in the pancreatic head region within the accessory       pancreatic duct.      Pancreatic parenchymal abnormalities were noted in the pancreatic head       and genu of the pancreas. These consisted of lobularity without       honeycombing and hyperechoic strands. The rest of the pancreas was       obscured as result of the pseudocyst.      Endosonographic imaging showed evidence of some prominence of the       biliary tree in the common bile duct (6.5 mm) and in the common hepatic       duct (7.8 mm) showed no stones with a normal contour.      A small amount of hyperechoic material consistent with sludge was       visualized endosonographically in the gallbladder.      Endosonographic imaging of the ampulla showed no intramural       (subepithelial) lesion.      Endosonographic imaging in the visualized portion of the liver showed no       mass.      No malignant-appearing lymph nodes were visualized in the celiac region       (level 20), peripancreatic region and porta hepatis region.      The celiac region was visualized. Impression:               EGD impression:                           - No gross lesions in the entire esophagus. Z-line                            regular, 40 cm from the incisors.                           -  Erythematous mucosa in the gastric body. No other                            gross lesions in the entire stomach. Biopsied.                            - No gross lesions in the duodenal bulb.                           - Duodenal deformity in the duodenal sweep (as                            noted above query healed groove pancreatitis versus                            healed ulcer)                           - Normal major papilla.                           - Congested duodenal mucosa in the region of the                            minor papilla (an area suggestive of a pancreatic                            duct was noted but the amount of swelling in this                            region makes finding of the true minor papilla                            difficult)..                           EUS impression:                           - A cystic lesion was seen in the pancreatic body                            and pancreatic tail. Tissue has not been obtained.                            However, the endosonographic appearance is                            consistent with a pancreatic pseudocyst.                            Cystgastrostomy created (15 mm AXIOS with 7 [place]                            by  5 cm double-pigtail through it).                           - The pancreatic duct had a dilated endosonographic                            appearance in the pancreatic head and genu of the                            pancreas. The pancreatic duct measured up to 4 mm                            in diameter.                           - Findings suggestive of pancreatic stones were                            identified in the pancreatic head and accessory                            pancreatic duct. It was difficult to evaluate the                            rest of the pancreatic duct as a result of the                            pseudocyst.                           - Pancreatic parenchymal abnormalities consisting                            of lobularity and hyperechoic strands were noted in                            the pancreatic  head and genu of the pancreas. It                            was difficult to evaluate the rest of the pancreas                            parenchyma as a result of the pseudocyst.                           - Hyperechoic material consistent with sludge was                            visualized endosonographically in the gallbladder.                           - No malignant-appearing lymph nodes were  visualized in the celiac region (level 20),                            peripancreatic region and porta hepatis region. Moderate Sedation:      Not Applicable - Patient had care per Anesthesia. Recommendation:           - The patient will be observed post-procedure,                            until all discharge criteria are met.                           - Discharge patient to home.                           - Patient has a contact number available for                            emergencies. The signs and symptoms of potential                            delayed complications were discussed with the                            patient. Return to normal activities tomorrow.                            Written discharge instructions were provided to the                            patient.                           - Low fat diet.                           - Observe patient's clinical course.                           - Await path results.                           - If able to hold Nexium for the next few weeks                            would be ideal so that we allow gastric juices to                            enter into the cyst cavity. If patient's GERD                            symptoms are too significant, then can restart dose.                           - Minimize NSAIDs for the next  week if possible.                           - Pancreas protocol CT abdomen in 3 to 4 weeks to                            be performed.                           - EGD for AXIOS  stent removal in 4 to 6 weeks                            (pending resolution of pseudocyst).                           - Will want to discuss with patient in clinic                            future attempt at potential pancreatic ERCP for                            minor papillotomy attempt as well as repeat EUS to                            evaluate the rest of the pancreas parenchyma                            (though this will have to wait until after the                            cystgastrostomy stent has been removed).                           - The findings and recommendations were discussed                            with the patient.                           - The findings and recommendations were discussed                            with the patient's family. Procedure Code(s):        --- Professional ---                           [phone], Esophagogastroduodenoscopy, flexible,                            transoral; with transmural drainage of pseudocyst                            (includes placement of transmural drainage  catheter[s]/stent[s], when performed, and                            endoscopic ultrasound, when performed)                           43237, Esophagogastroduodenoscopy, flexible,                            transoral; with endoscopic ultrasound examination                            limited to the esophagus, stomach or duodenum, and                            adjacent structures                           43239, 59, Esophagogastroduodenoscopy, flexible,                            transoral; with biopsy, single or multiple Diagnosis Code(s):        --- Professional ---                           K31.89, Other diseases of stomach and duodenum                           K86.2, Cyst of pancreas                           R93.3, Abnormal findings on diagnostic imaging of                            other parts of digestive tract                            K86.9, Disease of pancreas, unspecified                           I89.9, Noninfective disorder of lymphatic vessels                            and lymph nodes, unspecified                           K85.90, Acute pancreatitis without necrosis or                            infection, unspecified                           R10.13, Epigastric pain                           R68.81, Early satiety  R63.4, Abnormal weight loss                           K83.8, Other specified diseases of biliary tract                           R93.5, Abnormal findings on diagnostic imaging of                            other abdominal regions, including retroperitoneum CPT copyright 2022 [employer]. All rights reserved. The codes documented in this report are preliminary and upon coder review may  be revised to meet current compliance requirements. Justice Britain, MD 05/02/2022 3:46:18 PM Number of Addenda: 0

## 2022-05-06 ENCOUNTER — Encounter (HOSPITAL_COMMUNITY): Payer: Self-pay | Admitting: Gastroenterology

## 2022-05-06 ENCOUNTER — Telehealth: Payer: Self-pay | Admitting: *Deleted

## 2022-05-06 ENCOUNTER — Telehealth: Payer: Self-pay

## 2022-05-06 DIAGNOSIS — Q453 Other congenital malformations of pancreas and pancreatic duct: Secondary | ICD-10-CM

## 2022-05-06 DIAGNOSIS — K863 Pseudocyst of pancreas: Secondary | ICD-10-CM

## 2022-05-06 LAB — SURGICAL PATHOLOGY

## 2022-05-06 NOTE — Patient Instructions (Signed)
Kyle Giles  05/06/2022     @PREFPERIOPPHARMACY @   Your procedure is scheduled on  05/13/2022.   Report to [facility] at  0900  A.M.   Call this number if you have problems the morning of surgery:  [phone]  If you experience any cold or flu symptoms such as cough, fever, chills, shortness of breath, etc. between now and your scheduled surgery, please notify us at the above number.   Remember:  Do not eat or drink after midnight.       DO NOT take any medications for diabetes the morning of your procedure.     Take these medicines the morning of surgery with A SIP OF WATER      xanax(if needed), zyrtec, nexium, tramadol(if needed).     Do not wear jewelry, make-up or nail polish.  Do not wear lotions, powders, or perfumes, or deodorant.  Do not shave 48 hours prior to surgery.  Men may shave face and neck.  Do not bring valuables to the hospital.  [facility] is not responsible for any belongings or valuables.  Contacts, dentures or bridgework may not be worn into surgery.  Leave your suitcase in the car.  After surgery it may be brought to your room.  For patients admitted to the hospital, discharge time will be determined by your treatment team.  Patients discharged the day of surgery will not be allowed to drive home and must have someone with them for 24 hours.    Special instructions:   DO NOT smoke tobacco or vape for 24 hours before your procedure.  Please read over the following fact sheets that you were given. Anesthesia Post-op Instructions and Care and Recovery After Surgery      Laparoscopic Inguinal Hernia Repair, Adult, Care After The following information offers guidance on how to care for yourself after your procedure. Your health care provider may also give you more specific instructions. If you have problems or questions, contact your health care provider. What can I expect after the procedure? After the procedure, it is common to  have: Pain. Swelling and bruising around the incision area. Scrotal swelling, in males. Some fluid or blood draining from your incisions. Follow these instructions at home: Medicines Take over-the-counter and prescription medicines only as told by your health care provider. Ask your health care provider if the medicine prescribed to you: Requires you to avoid driving or using machinery. Can cause constipation. You may need to take these actions to prevent or treat constipation: Drink enough fluid to keep your urine pale yellow. Take over-the-counter or prescription medicines. Eat foods that are high in fiber, such as beans, whole grains, and fresh fruits and vegetables. Limit foods that are high in fat and processed sugars, such as fried or sweet foods. Incision care  Follow instructions from your health care provider about how to take care of your incisions. Make sure you: Wash your hands with soap and water for at least 20 seconds before and after you change your bandage (dressing). If soap and water are not available, use hand sanitizer. Change your dressing as told by your health care provider. Leave stitches (sutures), skin glue, or adhesive strips in place. These skin closures may need to stay in place for 2 weeks or longer. If adhesive strip edges start to loosen and curl up, you may trim the loose edges. Do not remove adhesive strips completely unless your health care provider  tells you to do that. Check your incision area every day for signs of infection. Check for: More redness, swelling, or pain. More fluid or blood. Warmth. Pus or a bad smell. Wear loose, soft clothing while your incisions heal. Managing pain and swelling If directed, put ice on the painful or swollen areas. To do this: Put ice in a plastic bag. Place a towel between your skin and the bag. Leave the ice on for 20 minutes, 2-3 times a day. Remove the ice if your skin turns bright red. This is very  important. If you cannot feel pain, heat, or cold, you have a greater risk of damage to the area.  Activity Do not lift anything that is heavier than 10 lb (4.5 kg), or the limit that you are told, until your health care provider says that it is safe. Ask your health care provider what activities are safe for you. A lot of activity during the first week after surgery can increase pain and swelling. For 1 week after your procedure: Avoid activities that take a lot of effort, such as exercise or sports. You may walk and climb stairs as needed for daily activity, but avoid long walks or climbing stairs for exercise. General instructions If you were given a sedative during the procedure, it can affect you for several hours. Do not drive or operate machinery until your health care provider says that it is safe. Do not take baths, swim, or use a hot tub until your health care provider approves. Ask your health care provider if you may take showers. You may only be allowed to take sponge baths. Do not use any products that contain nicotine or tobacco. These products include cigarettes, chewing tobacco, and vaping devices, such as e-cigarettes. If you need help quitting, ask your health care provider. Keep all follow-up visits. This is important. Contact a health care provider if: You have any of these signs of infection: More redness, swelling, or pain around your incisions or your groin area. More fluid or blood coming from an incision. Warmth coming from an incision. Pus or a bad smell coming from an incision. A fever or chills. You have more swelling in your scrotum, if you are male. You have severe pain and medicines do not help. You have abdominal pain or swelling. You cannot urinate or have a bowel movement. You faint or feel dizzy. You have nausea and vomiting. Get help right away if: You have redness, warmth, or pain in your leg. You have chest pain. You have problems breathing. These  symptoms may represent a serious problem that is an emergency. Do not wait to see if the symptoms will go away. Get medical help right away. Call your local emergency services (911 in the [place] Do not drive yourself to the hospital. Summary Pain, swelling, and bruising are common after the procedure. Check your incision area every day for signs of infection, such as more redness, swelling, or pain. Put ice on painful or swollen areas for 20 minutes, 2-3 times a day. This information is not intended to replace advice given to you by your health care provider. Make sure you discuss any questions you have with your health care provider. Document Revised: 09/21/2019 Document Reviewed: 09/21/2019 Elsevier Patient Education  [place] Anesthesia, Adult, Care After The following information offers guidance on how to care for yourself after your procedure. Your health care provider may also give you more specific instructions. If you have problems or questions,  contact your health care provider. What can I expect after the procedure? After the procedure, it is common for people to: Have pain or discomfort at the IV site. Have nausea or vomiting. Have a sore throat or hoarseness. Have trouble concentrating. Feel cold or chills. Feel weak, sleepy, or tired (fatigue). Have soreness and body aches. These can affect parts of the body that were not involved in surgery. Follow these instructions at home: For the time period you were told by your health care provider:  Rest. Do not participate in activities where you could fall or become injured. Do not drive or use machinery. Do not drink alcohol. Do not take sleeping pills or medicines that cause drowsiness. Do not make important decisions or sign legal documents. Do not take care of children on your own. General instructions Drink enough fluid to keep your urine pale yellow. If you have sleep apnea, surgery and certain medicines  can increase your risk for breathing problems. Follow instructions from your health care provider about wearing your sleep device: Anytime you are sleeping, including during daytime naps. While taking prescription pain medicines, sleeping medicines, or medicines that make you drowsy. Return to your normal activities as told by your health care provider. Ask your health care provider what activities are safe for you. Take over-the-counter and prescription medicines only as told by your health care provider. Do not use any products that contain nicotine or tobacco. These products include cigarettes, chewing tobacco, and vaping devices, such as e-cigarettes. These can delay incision healing after surgery. If you need help quitting, ask your health care provider. Contact a health care provider if: You have nausea or vomiting that does not get better with medicine. You vomit every time you eat or drink. You have pain that does not get better with medicine. You cannot urinate or have bloody urine. You develop a skin rash. You have a fever. Get help right away if: You have trouble breathing. You have chest pain. You vomit blood. These symptoms may be an emergency. Get help right away. Call 911. Do not wait to see if the symptoms will go away. Do not drive yourself to the hospital. Summary After the procedure, it is common to have a sore throat, hoarseness, nausea, vomiting, or to feel weak, sleepy, or fatigue. For the time period you were told by your health care provider, do not drive or use machinery. Get help right away if you have difficulty breathing, have chest pain, or vomit blood. These symptoms may be an emergency. This information is not intended to replace advice given to you by your health care provider. Make sure you discuss any questions you have with your health care provider. Document Revised: 04/20/2021 Document Reviewed: 04/20/2021 Elsevier Patient Education  [place]. How to Use Chlorhexidine Before Surgery Chlorhexidine gluconate (CHG) is a germ-killing (antiseptic) solution that is used to clean the skin. It can get rid of the bacteria that normally live on the skin and can keep them away for about 24 hours. To clean your skin with CHG, you may be given: A CHG solution to use in the shower or as part of a sponge bath. A prepackaged cloth that contains CHG. Cleaning your skin with CHG may help lower the risk for infection: While you are staying in the intensive care unit of the hospital. If you have a vascular access, such as a central line, to provide short-term or long-term access to your veins. If you have a catheter  to drain urine from your bladder. If you are on a ventilator. A ventilator is a machine that helps you breathe by moving air in and out of your lungs. After surgery. What are the risks? Risks of using CHG include: A skin reaction. Hearing loss, if CHG gets in your ears and you have a perforated eardrum. Eye injury, if CHG gets in your eyes and is not rinsed out. The CHG product catching fire. Make sure that you avoid smoking and flames after applying CHG to your skin. Do not use CHG: If you have a chlorhexidine allergy or have previously reacted to chlorhexidine. On babies younger than 34 months of age. How to use CHG solution Use CHG only as told by your health care provider, and follow the instructions on the label. Use the full amount of CHG as directed. Usually, this is one bottle. During a shower Follow these steps when using CHG solution during a shower (unless your health care provider gives you different instructions): Start the shower. Use your normal soap and shampoo to wash your face and hair. Turn off the shower or move out of the shower stream. Pour the CHG onto a clean washcloth. Do not use any type of brush or rough-edged sponge. Starting at your neck, lather your body down to your toes. Make sure you follow these  instructions: If you will be having surgery, pay special attention to the part of your body where you will be having surgery. Scrub this area for at least 1 minute. Do not use CHG on your head or face. If the solution gets into your ears or eyes, rinse them well with water. Avoid your genital area. Avoid any areas of skin that have broken skin, cuts, or scrapes. Scrub your back and under your arms. Make sure to wash skin folds. Let the lather sit on your skin for 1-2 minutes or as long as told by your health care provider. Thoroughly rinse your entire body in the shower. Make sure that all body creases and crevices are rinsed well. Dry off with a clean towel. Do not put any substances on your body afterward--such as powder, lotion, or perfume--unless you are told to do so by your health care provider. Only use lotions that are recommended by the manufacturer. Put on clean clothes or pajamas. If it is the night before your surgery, sleep in clean sheets.  During a sponge bath Follow these steps when using CHG solution during a sponge bath (unless your health care provider gives you different instructions): Use your normal soap and shampoo to wash your face and hair. Pour the CHG onto a clean washcloth. Starting at your neck, lather your body down to your toes. Make sure you follow these instructions: If you will be having surgery, pay special attention to the part of your body where you will be having surgery. Scrub this area for at least 1 minute. Do not use CHG on your head or face. If the solution gets into your ears or eyes, rinse them well with water. Avoid your genital area. Avoid any areas of skin that have broken skin, cuts, or scrapes. Scrub your back and under your arms. Make sure to wash skin folds. Let the lather sit on your skin for 1-2 minutes or as long as told by your health care provider. Using a different clean, wet washcloth, thoroughly rinse your entire body. Make sure that  all body creases and crevices are rinsed well. Dry off with a clean  towel. Do not put any substances on your body afterward--such as powder, lotion, or perfume--unless you are told to do so by your health care provider. Only use lotions that are recommended by the manufacturer. Put on clean clothes or pajamas. If it is the night before your surgery, sleep in clean sheets. How to use CHG prepackaged cloths Only use CHG cloths as told by your health care provider, and follow the instructions on the label. Use the CHG cloth on clean, dry skin. Do not use the CHG cloth on your head or face unless your health care provider tells you to. When washing with the CHG cloth: Avoid your genital area. Avoid any areas of skin that have broken skin, cuts, or scrapes. Before surgery Follow these steps when using a CHG cloth to clean before surgery (unless your health care provider gives you different instructions): Using the CHG cloth, vigorously scrub the part of your body where you will be having surgery. Scrub using a back-and-forth motion for 3 minutes. The area on your body should be completely wet with CHG when you are done scrubbing. Do not rinse. Discard the cloth and let the area air-dry. Do not put any substances on the area afterward, such as powder, lotion, or perfume. Put on clean clothes or pajamas. If it is the night before your surgery, sleep in clean sheets.  For general bathing Follow these steps when using CHG cloths for general bathing (unless your health care provider gives you different instructions). Use a separate CHG cloth for each area of your body. Make sure you wash between any folds of skin and between your fingers and toes. Wash your body in the following order, switching to a new cloth after each step: The front of your neck, shoulders, and chest. Both of your arms, under your arms, and your hands. Your stomach and groin area, avoiding the genitals. Your right leg and  foot. Your left leg and foot. The back of your neck, your back, and your buttocks. Do not rinse. Discard the cloth and let the area air-dry. Do not put any substances on your body afterward--such as powder, lotion, or perfume--unless you are told to do so by your health care provider. Only use lotions that are recommended by the manufacturer. Put on clean clothes or pajamas. Contact a health care provider if: Your skin gets irritated after scrubbing. You have questions about using your solution or cloth. You swallow any chlorhexidine. Call your local poison control center (1-605-489-6405 in the [place] Get help right away if: Your eyes itch badly, or they become very red or swollen. Your skin itches badly and is red or swollen. Your hearing changes. You have trouble seeing. You have swelling or tingling in your mouth or throat. You have trouble breathing. These symptoms may represent a serious problem that is an emergency. Do not wait to see if the symptoms will go away. Get medical help right away. Call your local emergency services (911 in the [place] Do not drive yourself to the hospital. Summary Chlorhexidine gluconate (CHG) is a germ-killing (antiseptic) solution that is used to clean the skin. Cleaning your skin with CHG may help to lower your risk for infection. You may be given CHG to use for bathing. It may be in a bottle or in a prepackaged cloth to use on your skin. Carefully follow your health care provider's instructions and the instructions on the product label. Do not use CHG if you have a chlorhexidine allergy. Contact your  health care provider if your skin gets irritated after scrubbing. This information is not intended to replace advice given to you by your health care provider. Make sure you discuss any questions you have with your health care provider. Document Revised: 05/21/2021 Document Reviewed: 04/03/2020 Elsevier Patient Education  [place].

## 2022-05-06 NOTE — Telephone Encounter (Signed)
Call placed to patient and patient made aware.  

## 2022-05-06 NOTE — Telephone Encounter (Signed)
-----   Message from Irving Copas., MD sent at 05/02/2022  5:05 PM EDT ----- Regarding: Follow-up Kyle Giles, This patient needs a CT abdomen in 2-1/2 to 3 weeks for follow-up of cystgastrostomy stent pancreatic cyst. This patient needs an EGD with stent pull in 3 to 5 weeks. Thanks. GM  CAP, Patient did well postprocedure for cystgastrostomy.  Since your procedure is quite a few weeks down the road, I think it is reasonable to keep your current date as long as he is still up for it for his inguinal hernia repair.  If he wants to wait until we remove the stent, that is okay to but I think it is all right unless you have any reservations.  The fistula tract should be well-formed by that point in time. Thanks. GM

## 2022-05-06 NOTE — Telephone Encounter (Signed)
Received call from patient [phone]~ telephone.   Patient states that he did have endoscopy on 05/02/2022. Patient is scheduled for hernia repair on 05/13/2022.  Patient reports that endoscopy went well, though he did have pancreatic stint placed.   Inquired if he may proceed with hernia repair scheduled on 05/13/2022.  Please advise.

## 2022-05-07 ENCOUNTER — Other Ambulatory Visit: Payer: Self-pay

## 2022-05-07 DIAGNOSIS — Z4689 Encounter for fitting and adjustment of other specified devices: Secondary | ICD-10-CM

## 2022-05-07 DIAGNOSIS — K863 Pseudocyst of pancreas: Secondary | ICD-10-CM

## 2022-05-07 DIAGNOSIS — K861 Other chronic pancreatitis: Secondary | ICD-10-CM

## 2022-05-07 DIAGNOSIS — Q453 Other congenital malformations of pancreas and pancreatic duct: Secondary | ICD-10-CM

## 2022-05-07 NOTE — Telephone Encounter (Signed)
The pt has been moved to 5/6 at 2 pm per GM   Left message on machine to call back

## 2022-05-07 NOTE — Telephone Encounter (Signed)
EGD stent pull has been scheduled for 06/13/22 at 930 am at [facility] with GM   CT order has been entered and sent to the schedulers   Left message on machine to call back

## 2022-05-07 NOTE — Telephone Encounter (Signed)
EGD scheduled, pt instructed and medications reviewed.  Patient instructions mailed to home.  Patient to call with any questions or concerns.  

## 2022-05-08 ENCOUNTER — Encounter (HOSPITAL_COMMUNITY)
Admission: RE | Admit: 2022-05-08 | Discharge: 2022-05-08 | Disposition: A | Discharge status: Home / Self Care | Payer: Commercial Managed Care - PPO | Source: Ambulatory Visit | Attending: Surgery | Admitting: Surgery

## 2022-05-08 ENCOUNTER — Encounter (HOSPITAL_COMMUNITY): Payer: Self-pay

## 2022-05-08 DIAGNOSIS — I10 Essential (primary) hypertension: Secondary | ICD-10-CM | POA: Insufficient documentation

## 2022-05-08 DIAGNOSIS — Z01818 Encounter for other preprocedural examination: Secondary | ICD-10-CM | POA: Diagnosis not present

## 2022-05-08 DIAGNOSIS — E119 Type 2 diabetes mellitus without complications: Secondary | ICD-10-CM | POA: Diagnosis not present

## 2022-05-08 DIAGNOSIS — Z720 Tobacco use: Secondary | ICD-10-CM | POA: Diagnosis not present

## 2022-05-08 LAB — BASIC METABOLIC PANEL
Anion gap: 10 (ref 5–15)
BUN: 32 mg/dL — ABNORMAL HIGH (ref 8–23)
CO2: 23 mmol/L (ref 22–32)
Calcium: 10.1 mg/dL (ref 8.9–10.3)
Chloride: 102 mmol/L (ref 98–111)
Creatinine, Ser: 1.33 mg/dL — ABNORMAL HIGH (ref 0.61–1.24)
GFR, Estimated: >60 mL/min (ref >60)
Glucose, Bld: 152 mg/dL — ABNORMAL HIGH (ref 70–99)
Potassium: 3.8 mmol/L (ref 3.5–5.1)
Sodium: 135 mmol/L (ref 135–145)

## 2022-05-09 LAB — HEMOGLOBIN A1C
Hgb A1c MFr Bld: 7 % — ABNORMAL HIGH (ref 4.8–5.6)
Mean Plasma Glucose: 154 mg/dL

## 2022-05-10 ENCOUNTER — Other Ambulatory Visit: Payer: Self-pay

## 2022-05-10 ENCOUNTER — Encounter: Payer: Self-pay | Admitting: Gastroenterology

## 2022-05-10 ENCOUNTER — Encounter: Payer: Self-pay | Admitting: Pharmacist

## 2022-05-10 ENCOUNTER — Other Ambulatory Visit (HOSPITAL_COMMUNITY): Payer: Self-pay

## 2022-05-10 MED ORDER — FREESTYLE LANCETS MISC
0 refills | Status: DC
  Filled 2022-05-10 – 2022-12-09 (×3): qty 100, 90d supply, fill #0

## 2022-05-10 MED ORDER — FREESTYLE LITE W/DEVICE KIT
PACK | 0 refills | Status: DC
  Filled 2022-05-10 – 2022-12-09 (×3): qty 1, 30d supply, fill #0

## 2022-05-10 MED ORDER — GLUCOSE BLOOD VI STRP
ORAL_STRIP | 0 refills | Status: DC
  Filled 2022-05-10 (×2): qty 100, 90d supply, fill #0

## 2022-05-13 ENCOUNTER — Other Ambulatory Visit: Payer: Self-pay

## 2022-05-13 ENCOUNTER — Ambulatory Visit (HOSPITAL_BASED_OUTPATIENT_CLINIC_OR_DEPARTMENT_OTHER): Payer: Commercial Managed Care - PPO | Admitting: Certified Registered"

## 2022-05-13 ENCOUNTER — Ambulatory Visit (HOSPITAL_COMMUNITY)
Admission: RE | Admit: 2022-05-13 | Discharge: 2022-05-13 | Disposition: A | Discharge status: Home / Self Care | Payer: Commercial Managed Care - PPO | Attending: Surgery | Admitting: Surgery

## 2022-05-13 ENCOUNTER — Encounter (HOSPITAL_COMMUNITY)
Admission: RE | Disposition: A | Discharge status: Home / Self Care | Payer: Self-pay | Source: Home / Self Care | Attending: Surgery

## 2022-05-13 ENCOUNTER — Ambulatory Visit (HOSPITAL_COMMUNITY): Payer: Commercial Managed Care - PPO | Admitting: Certified Registered"

## 2022-05-13 ENCOUNTER — Encounter (HOSPITAL_COMMUNITY): Payer: Self-pay | Admitting: Surgery

## 2022-05-13 DIAGNOSIS — E119 Type 2 diabetes mellitus without complications: Secondary | ICD-10-CM | POA: Insufficient documentation

## 2022-05-13 DIAGNOSIS — F17210 Nicotine dependence, cigarettes, uncomplicated: Secondary | ICD-10-CM | POA: Diagnosis not present

## 2022-05-13 DIAGNOSIS — K863 Pseudocyst of pancreas: Secondary | ICD-10-CM

## 2022-05-13 DIAGNOSIS — Z7984 Long term (current) use of oral hypoglycemic drugs: Secondary | ICD-10-CM | POA: Insufficient documentation

## 2022-05-13 DIAGNOSIS — K4090 Unilateral inguinal hernia, without obstruction or gangrene, not specified as recurrent: Secondary | ICD-10-CM | POA: Diagnosis not present

## 2022-05-13 DIAGNOSIS — K861 Other chronic pancreatitis: Secondary | ICD-10-CM | POA: Insufficient documentation

## 2022-05-13 DIAGNOSIS — Z4689 Encounter for fitting and adjustment of other specified devices: Secondary | ICD-10-CM

## 2022-05-13 DIAGNOSIS — Q453 Other congenital malformations of pancreas and pancreatic duct: Secondary | ICD-10-CM

## 2022-05-13 DIAGNOSIS — K660 Peritoneal adhesions (postprocedural) (postinfection): Secondary | ICD-10-CM | POA: Diagnosis not present

## 2022-05-13 DIAGNOSIS — I10 Essential (primary) hypertension: Secondary | ICD-10-CM | POA: Diagnosis not present

## 2022-05-13 HISTORY — PX: XI ROBOTIC ASSISTED INGUINAL HERNIA REPAIR WITH MESH: SHX6706

## 2022-05-13 LAB — GLUCOSE, CAPILLARY: Glucose-Capillary: 115 mg/dL — ABNORMAL HIGH (ref 70–99)

## 2022-05-13 SURGERY — REPAIR, HERNIA, INGUINAL, ROBOT-ASSISTED, LAPAROSCOPIC, USING MESH
Anesthesia: General | Site: Inguinal | Laterality: Left

## 2022-05-13 MED ORDER — FENTANYL CITRATE (PF) 250 MCG/5ML IJ SOLN
INTRAMUSCULAR | Status: DC | PRN
  Administered 2022-05-13 (×3): 50 ug via INTRAVENOUS

## 2022-05-13 MED ORDER — DOCUSATE SODIUM 100 MG PO CAPS: 100.0000 mg | ORAL_CAPSULE | Freq: Two times a day (BID) | ORAL | 2 refills | Status: DC

## 2022-05-13 MED ORDER — STERILE WATER FOR IRRIGATION IR SOLN
Status: DC | PRN
  Administered 2022-05-13: 500 mL

## 2022-05-13 MED ORDER — SUGAMMADEX SODIUM 200 MG/2ML IV SOLN
INTRAVENOUS | Status: DC | PRN
  Administered 2022-05-13: 200 mg via INTRAVENOUS

## 2022-05-13 MED ORDER — LIDOCAINE 2% (20 MG/ML) 5 ML SYRINGE
INTRAMUSCULAR | Status: DC | PRN
  Administered 2022-05-13: 100 mg via INTRAVENOUS

## 2022-05-13 MED ORDER — BUPIVACAINE HCL (PF) 0.5 % IJ SOLN
INTRAMUSCULAR | Status: AC
  Filled 2022-05-13: qty 30

## 2022-05-13 MED ORDER — BUPIVACAINE HCL (PF) 0.5 % IJ SOLN
INTRAMUSCULAR | Status: DC | PRN
  Administered 2022-05-13: 30 mL

## 2022-05-13 MED ORDER — ORAL CARE MOUTH RINSE: 15.0000 mL | Freq: Once | OROMUCOSAL | Status: AC

## 2022-05-13 MED ORDER — OXYCODONE HCL 5 MG PO TABS: 5.0000 mg | ORAL_TABLET | Freq: Four times a day (QID) | ORAL | 0 refills | Status: DC | PRN

## 2022-05-13 MED ORDER — PHENYLEPHRINE 80 MCG/ML (10ML) SYRINGE FOR IV PUSH (FOR BLOOD PRESSURE SUPPORT)
PREFILLED_SYRINGE | INTRAVENOUS | Status: DC | PRN
  Administered 2022-05-13 (×2): 80 ug via INTRAVENOUS

## 2022-05-13 MED ORDER — OXYCODONE HCL 5 MG/5ML PO SOLN: 5.0000 mg | Freq: Once | ORAL | Status: AC | PRN

## 2022-05-13 MED ORDER — LIDOCAINE HCL (PF) 2 % IJ SOLN
INTRAMUSCULAR | Status: AC
  Filled 2022-05-13: qty 5

## 2022-05-13 MED ORDER — EPHEDRINE 5 MG/ML INJ
INTRAVENOUS | Status: AC
  Filled 2022-05-13: qty 5

## 2022-05-13 MED ORDER — LACTATED RINGERS IV SOLN: INTRAVENOUS | Status: DC

## 2022-05-13 MED ORDER — ROCURONIUM BROMIDE 10 MG/ML (PF) SYRINGE
PREFILLED_SYRINGE | INTRAVENOUS | Status: DC | PRN
  Administered 2022-05-13: 50 mg via INTRAVENOUS

## 2022-05-13 MED ORDER — FENTANYL CITRATE (PF) 250 MCG/5ML IJ SOLN
INTRAMUSCULAR | Status: AC
  Filled 2022-05-13: qty 5

## 2022-05-13 MED ORDER — SODIUM CHLORIDE 0.9 % IV SOLN: INTRAVENOUS | Status: DC

## 2022-05-13 MED ORDER — FENTANYL CITRATE PF 50 MCG/ML IJ SOSY
25.0000 ug | PREFILLED_SYRINGE | INTRAMUSCULAR | Status: DC | PRN
  Administered 2022-05-13: 50 ug via INTRAVENOUS
  Filled 2022-05-13: qty 1

## 2022-05-13 MED ORDER — PROPOFOL 10 MG/ML IV BOLUS
INTRAVENOUS | Status: AC
  Filled 2022-05-13: qty 20

## 2022-05-13 MED ORDER — EPHEDRINE SULFATE-NACL 50-0.9 MG/10ML-% IV SOSY
PREFILLED_SYRINGE | INTRAVENOUS | Status: DC | PRN
  Administered 2022-05-13 (×3): 5 mg via INTRAVENOUS

## 2022-05-13 MED ORDER — ONDANSETRON HCL 4 MG/2ML IJ SOLN
INTRAMUSCULAR | Status: DC | PRN
  Administered 2022-05-13: 4 mg via INTRAVENOUS

## 2022-05-13 MED ORDER — CEFAZOLIN SODIUM-DEXTROSE 2-4 GM/100ML-% IV SOLN
2.0000 g | INTRAVENOUS | Status: AC
  Administered 2022-05-13: 2 g via INTRAVENOUS

## 2022-05-13 MED ORDER — CHLORHEXIDINE GLUCONATE CLOTH 2 % EX PADS: 6.0000 | MEDICATED_PAD | Freq: Once | CUTANEOUS | Status: DC

## 2022-05-13 MED ORDER — ROCURONIUM BROMIDE 10 MG/ML (PF) SYRINGE
PREFILLED_SYRINGE | INTRAVENOUS | Status: AC
  Filled 2022-05-13: qty 10

## 2022-05-13 MED ORDER — PROPOFOL 10 MG/ML IV BOLUS
INTRAVENOUS | Status: DC | PRN
  Administered 2022-05-13: 130 mg via INTRAVENOUS

## 2022-05-13 MED ORDER — DEXAMETHASONE SODIUM PHOSPHATE 4 MG/ML IJ SOLN
INTRAMUSCULAR | Status: DC | PRN
  Administered 2022-05-13: 5 mg via INTRAVENOUS

## 2022-05-13 MED ORDER — CHLORHEXIDINE GLUCONATE 0.12 % MT SOLN
15.0000 mL | Freq: Once | OROMUCOSAL | Status: AC
  Administered 2022-05-13: 15 mL via OROMUCOSAL

## 2022-05-13 MED ORDER — ACETAMINOPHEN 500 MG PO TABS: 1000.0000 mg | ORAL_TABLET | Freq: Four times a day (QID) | ORAL | 0 refills | Status: AC

## 2022-05-13 MED ORDER — OXYCODONE HCL 5 MG PO TABS
5.0000 mg | ORAL_TABLET | Freq: Once | ORAL | Status: AC | PRN
  Administered 2022-05-13: 5 mg via ORAL
  Filled 2022-05-13: qty 1

## 2022-05-13 MED ORDER — CEFAZOLIN SODIUM-DEXTROSE 2-4 GM/100ML-% IV SOLN
INTRAVENOUS | Status: AC
  Filled 2022-05-13: qty 100

## 2022-05-13 MED ORDER — MIDAZOLAM HCL 2 MG/2ML IJ SOLN
INTRAMUSCULAR | Status: DC | PRN
  Administered 2022-05-13: 2 mg via INTRAVENOUS

## 2022-05-13 MED ORDER — ONDANSETRON HCL 4 MG/2ML IJ SOLN
INTRAMUSCULAR | Status: AC
  Filled 2022-05-13: qty 2

## 2022-05-13 MED ORDER — ONDANSETRON HCL 4 MG/2ML IJ SOLN: 4.0000 mg | Freq: Once | INTRAMUSCULAR | Status: DC | PRN

## 2022-05-13 MED ORDER — MIDAZOLAM HCL 2 MG/2ML IJ SOLN
INTRAMUSCULAR | Status: AC
  Filled 2022-05-13: qty 2

## 2022-05-13 MED ORDER — PHENYLEPHRINE 80 MCG/ML (10ML) SYRINGE FOR IV PUSH (FOR BLOOD PRESSURE SUPPORT)
PREFILLED_SYRINGE | INTRAVENOUS | Status: AC
  Filled 2022-05-13: qty 10

## 2022-05-13 SURGICAL SUPPLY — 57 items
ADH SKN CLS APL DERMABOND .7 (GAUZE/BANDAGES/DRESSINGS) ×1
APL PRP STRL LF DISP 70% ISPRP (MISCELLANEOUS) ×1
BLADE SURG 15 STRL LF DISP TIS (BLADE) ×1 IMPLANT
BLADE SURG 15 STRL SS (BLADE) ×1
CHLORAPREP W/TINT 26 (MISCELLANEOUS) ×1 IMPLANT
COVER LIGHT HANDLE STERIS (MISCELLANEOUS) ×2 IMPLANT
COVER MAYO STAND XLG (MISCELLANEOUS) ×2 IMPLANT
COVER TIP SHEARS 8 DVNC (MISCELLANEOUS) ×1 IMPLANT
DEFOGGER SCOPE WARMER CLEARIFY (MISCELLANEOUS) IMPLANT
DERMABOND ADVANCED .7 DNX12 (GAUZE/BANDAGES/DRESSINGS) ×1 IMPLANT
DRAPE ARM DVNC X/XI (DISPOSABLE) ×3 IMPLANT
DRAPE COLUMN DVNC XI (DISPOSABLE) ×2 IMPLANT
DRAPE HALF SHEET 70X43 (DRAPES) ×1 IMPLANT
DRIVER NDL MEGA SUTCUT DVNCXI (INSTRUMENTS) ×2 IMPLANT
DRIVER NDLE MEGA SUTCUT DVNCXI (INSTRUMENTS) ×1 IMPLANT
ELECT REM PT RETURN 9FT ADLT (ELECTROSURGICAL) ×1
ELECTRODE REM PT RTRN 9FT ADLT (ELECTROSURGICAL) ×2 IMPLANT
FORCEPS BPLR R/ABLATION 8 DVNC (INSTRUMENTS) ×4 IMPLANT
GAUZE SPONGE 4X4 12PLY STRL (GAUZE/BANDAGES/DRESSINGS) ×2 IMPLANT
GLOVE BIO SURGEON STRL SZ7 (GLOVE) IMPLANT
GLOVE BIOGEL PI IND STRL 6 (GLOVE) IMPLANT
GLOVE BIOGEL PI IND STRL 6.5 (GLOVE) ×2 IMPLANT
GLOVE BIOGEL PI IND STRL 7.0 (GLOVE) ×8 IMPLANT
GLOVE SURG SS PI 6.5 STRL IVOR (GLOVE) ×4 IMPLANT
GOWN STRL REUS W/TWL LRG LVL3 (GOWN DISPOSABLE) ×3 IMPLANT
GRASPER SUT TROCAR 14GX15 (MISCELLANEOUS) IMPLANT
IRRIGATOR SUCT 8 DISP DVNC XI (IRRIGATION / IRRIGATOR) IMPLANT
KIT PINK PAD W/HEAD ARE REST (MISCELLANEOUS) ×1
KIT PINK PAD W/HEAD ARM REST (MISCELLANEOUS) ×2 IMPLANT
KIT TURNOVER KIT A (KITS) ×2 IMPLANT
MANIFOLD NEPTUNE II (INSTRUMENTS) ×2 IMPLANT
MESH PROGRIP LAP SELF FIXATING (Mesh General) ×1 IMPLANT
MESH PROGRIP LAP SLF FIX 16X12 (Mesh General) ×1 IMPLANT
NDL HYPO 21X1 ECLIPSE (NEEDLE) ×2 IMPLANT
NDL INSUFFLATION 14GA 120MM (NEEDLE) ×2 IMPLANT
NEEDLE HYPO 21X1 ECLIPSE (NEEDLE) ×1 IMPLANT
NEEDLE INSUFFLATION 14GA 120MM (NEEDLE) ×1 IMPLANT
OBTURATOR OPTICAL STND 8 DVNC (TROCAR) ×1
OBTURATOR OPTICALSTD 8 DVNC (TROCAR) ×2 IMPLANT
PACK LAP CHOLE LZT030E (CUSTOM PROCEDURE TRAY) ×2 IMPLANT
PENCIL HANDSWITCHING (ELECTRODE) ×2 IMPLANT
SCISSORS MNPLR CVD DVNC XI (INSTRUMENTS) ×2 IMPLANT
SEAL CANN UNIV 5-8 DVNC XI (MISCELLANEOUS) ×3 IMPLANT
SET BASIN LINEN APH (SET/KITS/TRAYS/PACK) ×1 IMPLANT
SET TUBE SMOKE EVAC HIGH FLOW (TUBING) ×2 IMPLANT
SOL PREP POV-IOD 4OZ 10% (MISCELLANEOUS) ×2 IMPLANT
SUT MNCRL AB 4-0 PS2 18 (SUTURE) ×2 IMPLANT
SUT V-LOC 90 ABS 3-0 VLT  V-20 (SUTURE)
SUT V-LOC 90 ABS 3-0 VLT V-20 (SUTURE) IMPLANT
SUT VIC AB 2-0 SH 27 (SUTURE)
SUT VIC AB 2-0 SH 27X BRD (SUTURE) IMPLANT
SYR 30ML LL (SYRINGE) ×1 IMPLANT
TAPE TRANSPORE STRL 2 31045 (GAUZE/BANDAGES/DRESSINGS) ×2 IMPLANT
TRAY FOL W/BAG SLVR 16FR STRL (SET/KITS/TRAYS/PACK) ×2 IMPLANT
TRAY FOLEY W/BAG SLVR 16FR LF (SET/KITS/TRAYS/PACK) ×1
WARMER LAPAROSCOPE (MISCELLANEOUS) IMPLANT
WATER STERILE IRR 500ML POUR (IV SOLUTION) ×2 IMPLANT

## 2022-05-13 NOTE — Transfer of Care (Signed)
Immediate Anesthesia Transfer of Care Note  Patient: Kyle Giles  Procedure(s) Performed: XI ROBOTIC ASSISTED INGUINAL HERNIA REPAIR WITH MESH (Left: Inguinal)  Patient Location: PACU  Anesthesia Type:General  Level of Consciousness: awake, alert , and oriented  Airway & Oxygen Therapy: Patient Spontanous Breathing and Patient connected to face mask oxygen  Post-op Assessment: Report given to RN and Post -op Vital signs reviewed and stable  Post vital signs: Reviewed and stable  Last Vitals:  Vitals Value Taken Time  BP 127/50 05/13/22 1320  Temp    Pulse 73 05/13/22 1321  Resp 13 05/13/22 1321  SpO2 100 % 05/13/22 1321  Vitals shown include unvalidated device data.  Last Pain:  Vitals:   05/13/22 0917  TempSrc: Oral  PainSc: 0-No pain      Patients Stated Pain Goal: 5 (05/13/22 0917)  Complications: No notable events documented.

## 2022-05-13 NOTE — Interval H&P Note (Signed)
History and Physical Interval Note:  05/13/2022 10:55 AM  Kyle Giles  has presented today for surgery, with the diagnosis of INGUINAL HERNIA, LEFT.  The various methods of treatment have been discussed with the patient and family. After consideration of risks, benefits and other options for treatment, the patient has consented to  Procedure(s): XI ROBOTIC ASSISTED INGUINAL HERNIA REPAIR WITH MESH (Left) as a surgical intervention.  The patient's history has been reviewed, patient examined, no change in status, stable for surgery.  I have reviewed the patient's chart and labs.  Questions were answered to the patient's satisfaction.     Willford Rabideau A Shekita Boyden

## 2022-05-13 NOTE — Op Note (Signed)
[facility] Operative Note  05/13/22  Preoperative Diagnosis: Left inguinal Hernia   Postoperative Diagnosis: Left inguinal hernia, intra-abdominal adhesions   Procedure(s) Performed: Robotic assisted laparoscopic left inguinal hernia repair with mesh, lysis of adhesions   Surgeon: Theophilus Kinds, DO   Assistants: No qualified resident was available    Anesthesia: General endotracheal   Anesthesiologist: Windell Norfolk, MD    Specimens: None   Estimated Blood Loss: Minimal   Blood Replacement: None    Complications: None   Wound Class:Clean   Operative Indications: The patient has a left inguinal hernia that is symptomatic and they want repaired. We discussed robotic assisted laparoscopic inguinal hernia repair and risk of bleeding, infection, issues with chronic pain post operatively, use of mesh, risk of recurrence, chance of needing to repair a bilateral hernia, risk of injury to bowel or bladder, and risk of injury to cord structures for male patients.   Findings: Direct left inguinal hernia Omental adhesions to the anterior abdominal wall Vas Deferens and cord structures identified and preserved Inguinal ProGrip mesh used Hemostasis achieved   Procedure: The patient was taken to the operating room and placed supine. General endotracheal anesthesia was induced. Intravenous antibiotics were administered per protocol.  A foley catheter was placed and a orogastric tube positioned to decompress the stomach. The abdomen was prepared and draped in the usual sterile fashion.  A time-out was completed verifying correct patient, procedure, site, positioning, and implant(s) and/or special equipment prior to beginning this procedure.  An incision was marked 20 cm above the pubic tubercle, slightly above the umbilicus. Veress needle inserted supraumbilically.  Saline drop test noted to be positive with gradual increase in pressure after initiation of gas  insufflation.  15 mm of pressure was achieved prior to removing the Veress needle and then placing a 8 mm port via the Optiview technique through the supraumbilical site.  Inspection of the area afterwards noted no injury to the surrounding organs during insertion of the needle and the port.  2 port sites were marked 8 cm to the lateral sides of the initial port, and a 8 mm robotic port was placed on the left side, another 8 mm robotic port on the right side under direct supervision.  There were some intra-abdominal adhesions to the anterior abdominal wall.  These were taken down with laparoscopic EndoShears.  The [employer] platform was then brought into the operative field and docked to the ports.  Examination of the abdominal cavity noted a left inguinal hernia.  A peritoneal flap was created approximately 8 cm cephalad to the defect by using scissors with electrocautery.  Dissection was carried down towards the pubic tubercle, developing the myopectineal orifice view. Laterally the flap was carried towards the ASIS.  A direct left inguinal hernia sac was noted, which carefully dissected away from the adjacent tissues to be fully reduced out of hernia cavity.  Any bleeding was controlled with combination of electrocautery and manual pressure.    After confirming adequate dissection and the peritoneal reflection completely down and away from the cord structures at the deep ring, a ProGrip mesh was placed within the anterior abdominal wall.  After noting proper placement of the mesh with the peritoneal reflection deep to it, the previously created peritoneal flap was secured back up to the anterior abdominal wall using running 3-0 V-Lock.  All needles were then removed out of the abdominal cavity, Xi platform undocked from the ports and removed off of operative field.  Marcaine was injected at the port sites.  The abdomen was then desufflated and ports removed under direct visualization. All skin incisions  were closed with a subcuticular stitch of Monocryl 4-0. Dermabond was applied. The testis was gently pulled down into its anatomic position in the scrotum.  Final inspection revealed acceptable hemostasis. All counts were correct at the end of the case. The patient was awakened from anesthesia and extubated without complication.  The patient went to the PACU in stable condition.   Theophilus Kinds, DO [facility] [address] [place], [place] [address] [phone] (office)

## 2022-05-13 NOTE — Progress Notes (Signed)
[facility]  Spoke with the patient's wife in the consultation room.  I explained that he tolerated the procedure without difficulty.  He has dissolvable stitches under the skin with overlying skin glue.  This will flake off in 10 to 14 days.  I discharged him home with a prescription for narcotic pain medication that they should take as needed for pain.  I also want him taking scheduled Tylenol.  If they take the narcotic pain medication, they should take a stool softener as well.  The patient will follow-up with me in 2 weeks.  All questions were answered to her expressed satisfaction.  Javona Bergevin, DO [facility] [address] [place], [place] [address] [phone] (office)  

## 2022-05-13 NOTE — Anesthesia Preprocedure Evaluation (Signed)
Anesthesia Evaluation  Patient identified by MRN, date of birth, ID band Patient awake    Reviewed: Allergy & Precautions, H&P , NPO status , Patient's Chart, lab work & pertinent test results, reviewed documented beta blocker date and time   Airway Mallampati: II  TM Distance: >3 FB Neck ROM: full    Dental no notable dental hx.    Pulmonary neg pulmonary ROS, Current Smoker   Pulmonary exam normal breath sounds clear to auscultation       Cardiovascular Exercise Tolerance: Good hypertension, negative cardio ROS + Valvular Problems/Murmurs  Rhythm:regular Rate:Normal     Neuro/Psych   Anxiety     negative neurological ROS  negative psych ROS   GI/Hepatic negative GI ROS, Neg liver ROS,GERD  ,,  Endo/Other  negative endocrine ROSdiabetes, Type 2    Renal/GU Renal diseasenegative Renal ROS  negative genitourinary   Musculoskeletal   Abdominal   Peds  Hematology negative hematology ROS (+)   Anesthesia Other Findings   Reproductive/Obstetrics negative OB ROS                             Anesthesia Physical Anesthesia Plan  ASA: 2  Anesthesia Plan: General and General ETT   Post-op Pain Management:    Induction:   PONV Risk Score and Plan: Ondansetron  Airway Management Planned:   Additional Equipment:   Intra-op Plan:   Post-operative Plan:   Informed Consent: I have reviewed the patients History and Physical, chart, labs and discussed the procedure including the risks, benefits and alternatives for the proposed anesthesia with the patient or authorized representative who has indicated his/her understanding and acceptance.     Dental Advisory Given  Plan Discussed with: CRNA  Anesthesia Plan Comments:        Anesthesia Quick Evaluation

## 2022-05-13 NOTE — Discharge Instructions (Signed)
Ambulatory Surgery Discharge Instructions  General Anesthesia or Sedation Do not drive or operate heavy machinery for 24 hours.  Do not consume alcohol, tranquilizers, sleeping medications, or any non-prescribed medications for 24 hours. Do not make important decisions or sign any important papers in the next 24 hours. You should have someone with you tonight at home.  Activity  You are advised to go directly home from the hospital.  Restrict your activities and rest for a day.  Resume light activity tomorrow. No heavy lifting over 10 lbs or strenuous exercise.  Fluids and Diet Begin with clear liquids, bouillon, dry toast, soda crackers.  If not nauseated, you may go to a regular diet when you desire.  Greasy and spicy foods are not advised.  Medications  If you have not had a bowel movement in 24 hours, take 2 tablespoons over the counter Milk of mag.             You May resume your blood thinners tomorrow (Aspirin, coumadin, or other).  You are being discharged with prescriptions for Opioid/Narcotic Medications: There are some specific considerations for these medications that you should know. Opioid Meds have risks & benefits. Addiction to these meds is always a concern with prolonged use Take medication only as directed Do not drive while taking narcotic pain medication Do not crush tablets or capsules Do not use a different container than medication was dispensed in Lock the container of medication in a cool, dry place out of reach of children and pets. Opioid medication can cause addiction Do not share with anyone else (this is a felony) Do not store medications for future use. Dispose of them properly.     Disposal:  Find a [facility] household drug take back site near you.  If you can't get to a drug take back site, use the recipe below as a last resort to dispose of expired, unused or unwanted drugs. Disposal  (Do not dispose chemotherapy drugs this way, talk to your  prescribing doctor instead.) Step 1: Mix drugs (do not crush) with dirt, kitty litter, or used coffee grounds and add a small amount of water to dissolve any solid medications. Step 2: Seal drugs in plastic bag. Step 3: Place plastic bag in trash. Step 4: Take prescription container and scratch out personal information, then recycle or throw away.  Operative Site  You have a liquid bandage over your incisions, this will begin to flake off in about a week. Ok to shower tomorrow. Keep wound clean and dry. No baths or swimming. No lifting more than 10 pounds.  Contact Information: If you have questions or concerns, please call our office, [phone], Monday- Thursday 8AM-5PM and Friday 8AM-12Noon.  If it is after hours or on the weekend, please call [facility] Main Number, [phone], and ask to speak to the surgeon on call for Dr. Kemani Demarais at [place].   SPECIFIC COMPLICATIONS TO WATCH FOR: Inability to urinate Fever over 101? F by mouth Nausea and vomiting lasting longer than 24 hours. Pain not relieved by medication ordered Swelling around the operative site Increased redness, warmth, hardness, around operative area Numbness, tingling, or cold fingers or toes Blood -soaked dressing, (small amounts of oozing may be normal) Increasing and progressive drainage from surgical area or exam site  

## 2022-05-13 NOTE — Anesthesia Procedure Notes (Signed)
Procedure Name: Intubation Date/Time: 05/13/2022 11:42 AM  Performed by: Julian Reil, CRNAPre-anesthesia Checklist: Patient identified, Emergency Drugs available, Suction available and Patient being monitored Patient Re-evaluated:Patient Re-evaluated prior to induction Oxygen Delivery Method: Circle system utilized Preoxygenation: Pre-oxygenation with 100% oxygen Induction Type: IV induction Ventilation: Mask ventilation without difficulty Laryngoscope Size: Miller and 3 Grade View: Grade I Tube type: Oral Tube size: 7.5 mm Number of attempts: 1 Airway Equipment and Method: Stylet Placement Confirmation: ETT inserted through vocal cords under direct vision, positive ETCO2 and breath sounds checked- equal and bilateral Secured at: 23 cm Tube secured with: Tape Dental Injury: Teeth and Oropharynx as per pre-operative assessment  Comments: 4x4s bite block used.

## 2022-05-15 NOTE — Anesthesia Postprocedure Evaluation (Signed)
Anesthesia Post Note  Patient: Kyle Giles  Procedure(s) Performed: XI ROBOTIC ASSISTED INGUINAL HERNIA REPAIR WITH MESH (Left: Inguinal)  Patient location during evaluation: Phase II Anesthesia Type: General Level of consciousness: awake Pain management: pain level controlled Vital Signs Assessment: post-procedure vital signs reviewed and stable Respiratory status: spontaneous breathing and respiratory function stable Cardiovascular status: blood pressure returned to baseline and stable Postop Assessment: no headache and no apparent nausea or vomiting Anesthetic complications: no Comments: Late entry   No notable events documented.   Last Vitals:  Vitals:   05/13/22 1400 05/13/22 1412  BP: (!) 119/49 134/66  Pulse: 67 72  Resp: (!) 0 18  Temp: 36.5 C 36.5 C  SpO2: 100% 99%    Last Pain:  Vitals:   05/13/22 1412  TempSrc: Axillary  PainSc: 5                  Windell Norfolk

## 2022-05-18 ENCOUNTER — Other Ambulatory Visit (HOSPITAL_COMMUNITY): Payer: Self-pay

## 2022-05-21 ENCOUNTER — Encounter (HOSPITAL_COMMUNITY): Payer: Self-pay | Admitting: Surgery

## 2022-05-21 ENCOUNTER — Encounter: Payer: Commercial Managed Care - PPO | Admitting: Surgery

## 2022-05-23 ENCOUNTER — Other Ambulatory Visit (HOSPITAL_COMMUNITY): Payer: Self-pay

## 2022-05-23 MED ORDER — ALPRAZOLAM 1 MG PO TABS
1.0000 mg | ORAL_TABLET | Freq: Three times a day (TID) | ORAL | 0 refills | Status: DC
  Filled 2022-05-23 (×2): qty 270, 90d supply, fill #0

## 2022-05-24 ENCOUNTER — Other Ambulatory Visit: Payer: Self-pay

## 2022-05-28 ENCOUNTER — Encounter: Payer: Self-pay | Admitting: Surgery

## 2022-05-28 ENCOUNTER — Ambulatory Visit (INDEPENDENT_AMBULATORY_CARE_PROVIDER_SITE_OTHER): Payer: Commercial Managed Care - PPO | Admitting: Surgery

## 2022-05-28 VITALS — BP 120/53 | HR 74 | Temp 98.2°F | Resp 16 | Ht 62.0 in | Wt 109.0 lb

## 2022-05-28 DIAGNOSIS — Z09 Encounter for follow-up examination after completed treatment for conditions other than malignant neoplasm: Secondary | ICD-10-CM

## 2022-05-28 NOTE — Progress Notes (Signed)
[facility] Note   HPI:  62 y.o. Male presents to clinic for post-op follow-up s/p robotic assisted laparoscopic left inguinal hernia repair with mesh on 4/8.  He has been doing well since surgery.  His pain is well-controlled, and he is not requiring any narcotic pain medications at this time.  He has a little bit of bruising at his incision sites, but otherwise has no complaints.  Denies any fevers or chills.  He is tolerating a diet without nausea and vomiting and moving his bowels without issue.  Review of Systems:  All other review of systems: otherwise negative   Vital Signs:  There were no vitals taken for this visit.   Physical Exam:  Physical Exam Vitals reviewed.  Constitutional:      Appearance: Normal appearance.  Abdominal:     Comments: Abdomen soft, nondistended, no percussion tenderness, nontender to palpation; no rigidity, guarding, rebound tenderness; incisions healing well with minimal Dermabond in place, mild improving ecchymosis at incision sites; no swelling or ecchymosis in the groin  Neurological:     Mental Status: He is alert.     Laboratory studies: None   Imaging:  None   Assessment:  62 y.o. yo Male who presents for follow-up status post robotic assisted laparoscopic left inguinal hernia repair with mesh on 4/8.  Plan:  -Overall patient is doing well-tolerating a diet, moving his bowels, and pain adequately controlled -Advised that he can ice his groin in the evenings to decrease swelling and pain -Continue with no heavy lifting for the next 2 weeks for total of 4 weeks after surgery.  He should slowly increase his activity once he is 4 weeks out from surgery -Follow up as needed  All of the above recommendations were discussed with the patient, and all of patient's questions were answered to his expressed satisfaction.  Theophilus Kinds, DO [facility] [address] [place], [place]  [address] [phone] (office)

## 2022-05-30 ENCOUNTER — Encounter: Payer: Commercial Managed Care - PPO | Admitting: Surgery

## 2022-05-31 ENCOUNTER — Ambulatory Visit (HOSPITAL_COMMUNITY)
Admission: RE | Admit: 2022-05-31 | Discharge: 2022-05-31 | Disposition: A | Discharge status: Home / Self Care | Payer: Commercial Managed Care - PPO | Source: Ambulatory Visit | Attending: Gastroenterology | Admitting: Gastroenterology

## 2022-05-31 DIAGNOSIS — K859 Acute pancreatitis without necrosis or infection, unspecified: Secondary | ICD-10-CM | POA: Diagnosis not present

## 2022-05-31 DIAGNOSIS — K863 Pseudocyst of pancreas: Secondary | ICD-10-CM | POA: Diagnosis not present

## 2022-05-31 DIAGNOSIS — Q453 Other congenital malformations of pancreas and pancreatic duct: Secondary | ICD-10-CM | POA: Insufficient documentation

## 2022-05-31 DIAGNOSIS — N281 Cyst of kidney, acquired: Secondary | ICD-10-CM | POA: Diagnosis not present

## 2022-05-31 MED ORDER — IOHEXOL 9 MG/ML PO SOLN
500.0000 mL | ORAL | Status: AC
  Administered 2022-05-31 (×2): 500 mL via ORAL

## 2022-05-31 MED ORDER — IOHEXOL 300 MG/ML  SOLN
100.0000 mL | Freq: Once | INTRAMUSCULAR | Status: AC | PRN
  Administered 2022-05-31: 100 mL via INTRAVENOUS

## 2022-06-03 ENCOUNTER — Encounter (HOSPITAL_COMMUNITY): Payer: Self-pay | Admitting: Gastroenterology

## 2022-06-04 NOTE — Progress Notes (Signed)
Kyle Giles  Prep instructions-n/a  PCP- DrJohn Phillips Odor Cardiologist- Dr Purvis Sheffield   EKG-05/08/22 Echo-08/16/19 Cath-n/a Stress- n/a ICD/PM-n/a Blood thinner-n/a GLP-1-n/a  Hx: HTN, DM, GERD, Umbilical hernia surgery 05/13/22, Heart Murmur. Patient reports saw Dr Purvis Sheffield in 2021 for palpitations/chest pain/reflux and at that visit recommended echo and f/u 4 months. Echo completed  7/12/21and per pt all tests checked out and hasn't seen since. No cardiac symptoms reported. Anesthesia Review: Yes

## 2022-06-10 ENCOUNTER — Other Ambulatory Visit: Payer: Self-pay

## 2022-06-10 ENCOUNTER — Ambulatory Visit (HOSPITAL_COMMUNITY)
Admission: RE | Admit: 2022-06-10 | Discharge: 2022-06-10 | Disposition: A | Discharge status: Home / Self Care | Payer: Commercial Managed Care - PPO | Attending: Gastroenterology | Admitting: Gastroenterology

## 2022-06-10 ENCOUNTER — Ambulatory Visit (HOSPITAL_BASED_OUTPATIENT_CLINIC_OR_DEPARTMENT_OTHER): Payer: Commercial Managed Care - PPO | Admitting: Certified Registered"

## 2022-06-10 ENCOUNTER — Telehealth: Payer: Self-pay

## 2022-06-10 ENCOUNTER — Ambulatory Visit (HOSPITAL_COMMUNITY): Payer: Commercial Managed Care - PPO | Admitting: Certified Registered"

## 2022-06-10 ENCOUNTER — Encounter (HOSPITAL_COMMUNITY): Payer: Self-pay | Admitting: Gastroenterology

## 2022-06-10 ENCOUNTER — Encounter (HOSPITAL_COMMUNITY)
Admission: RE | Disposition: A | Discharge status: Home / Self Care | Payer: Self-pay | Source: Home / Self Care | Attending: Gastroenterology

## 2022-06-10 DIAGNOSIS — K222 Esophageal obstruction: Secondary | ICD-10-CM | POA: Insufficient documentation

## 2022-06-10 DIAGNOSIS — T182XXA Foreign body in stomach, initial encounter: Secondary | ICD-10-CM | POA: Diagnosis not present

## 2022-06-10 DIAGNOSIS — K449 Diaphragmatic hernia without obstruction or gangrene: Secondary | ICD-10-CM | POA: Diagnosis not present

## 2022-06-10 DIAGNOSIS — T85528A Displacement of other gastrointestinal prosthetic devices, implants and grafts, initial encounter: Secondary | ICD-10-CM

## 2022-06-10 DIAGNOSIS — I10 Essential (primary) hypertension: Secondary | ICD-10-CM

## 2022-06-10 DIAGNOSIS — Z7984 Long term (current) use of oral hypoglycemic drugs: Secondary | ICD-10-CM

## 2022-06-10 DIAGNOSIS — K3189 Other diseases of stomach and duodenum: Secondary | ICD-10-CM

## 2022-06-10 DIAGNOSIS — F17210 Nicotine dependence, cigarettes, uncomplicated: Secondary | ICD-10-CM | POA: Diagnosis not present

## 2022-06-10 DIAGNOSIS — K862 Cyst of pancreas: Secondary | ICD-10-CM | POA: Insufficient documentation

## 2022-06-10 DIAGNOSIS — K219 Gastro-esophageal reflux disease without esophagitis: Secondary | ICD-10-CM

## 2022-06-10 DIAGNOSIS — Z978 Presence of other specified devices: Secondary | ICD-10-CM | POA: Diagnosis not present

## 2022-06-10 DIAGNOSIS — K2289 Other specified disease of esophagus: Secondary | ICD-10-CM | POA: Diagnosis not present

## 2022-06-10 DIAGNOSIS — E119 Type 2 diabetes mellitus without complications: Secondary | ICD-10-CM | POA: Diagnosis not present

## 2022-06-10 DIAGNOSIS — F17200 Nicotine dependence, unspecified, uncomplicated: Secondary | ICD-10-CM | POA: Diagnosis not present

## 2022-06-10 DIAGNOSIS — Z8719 Personal history of other diseases of the digestive system: Secondary | ICD-10-CM | POA: Diagnosis not present

## 2022-06-10 DIAGNOSIS — Z4659 Encounter for fitting and adjustment of other gastrointestinal appliance and device: Secondary | ICD-10-CM | POA: Diagnosis not present

## 2022-06-10 DIAGNOSIS — K863 Pseudocyst of pancreas: Secondary | ICD-10-CM

## 2022-06-10 HISTORY — PX: BIOPSY: SHX5522

## 2022-06-10 HISTORY — PX: ESOPHAGOGASTRODUODENOSCOPY (EGD) WITH PROPOFOL: SHX5813

## 2022-06-10 HISTORY — PX: STENT REMOVAL: SHX6421

## 2022-06-10 LAB — GLUCOSE, CAPILLARY: Glucose-Capillary: 135 mg/dL — ABNORMAL HIGH (ref 70–99)

## 2022-06-10 SURGERY — ESOPHAGOGASTRODUODENOSCOPY (EGD) WITH PROPOFOL
Anesthesia: Monitor Anesthesia Care

## 2022-06-10 MED ORDER — LIDOCAINE HCL (CARDIAC) PF 100 MG/5ML IV SOSY
PREFILLED_SYRINGE | INTRAVENOUS | Status: DC | PRN
  Administered 2022-06-10: 40 mg via INTRAVENOUS
  Administered 2022-06-10: 20 mg via INTRAVENOUS

## 2022-06-10 MED ORDER — LACTATED RINGERS IV SOLN: INTRAVENOUS | Status: DC

## 2022-06-10 MED ORDER — PROPOFOL 10 MG/ML IV BOLUS
INTRAVENOUS | Status: DC | PRN
  Administered 2022-06-10 (×4): 30 mg via INTRAVENOUS

## 2022-06-10 SURGICAL SUPPLY — 15 items
BLOCK BITE 60FR ADLT L/F BLUE (MISCELLANEOUS) ×2 IMPLANT
ELECT REM PT RETURN 9FT ADLT (ELECTROSURGICAL)
ELECTRODE REM PT RTRN 9FT ADLT (ELECTROSURGICAL) IMPLANT
FORCEP RJ3 GP 1.8X160 W-NEEDLE (CUTTING FORCEPS) IMPLANT
FORCEPS BIOP RAD 4 LRG CAP 4 (CUTTING FORCEPS) IMPLANT
NDL SCLEROTHERAPY 25GX240 (NEEDLE) IMPLANT
NEEDLE SCLEROTHERAPY 25GX240 (NEEDLE) IMPLANT
PROBE APC STR FIRE (PROBE) IMPLANT
PROBE INJECTION GOLD (MISCELLANEOUS)
PROBE INJECTION GOLD 7FR (MISCELLANEOUS) IMPLANT
SNARE SHORT THROW 13M SML OVAL (MISCELLANEOUS) IMPLANT
SYR 50ML LL SCALE MARK (SYRINGE) IMPLANT
TUBING ENDO SMARTCAP PENTAX (MISCELLANEOUS) ×6 IMPLANT
TUBING IRRIGATION ENDOGATOR (MISCELLANEOUS) ×2 IMPLANT
WATER STERILE IRR 1000ML POUR (IV SOLUTION) IMPLANT

## 2022-06-10 NOTE — H&P (Signed)
GASTROENTEROLOGY PROCEDURE H&P NOTE   Primary Care Physician: Assunta Found, MD  HPI: Kyle Giles is a 62 y.o. male who presents for EGD for cystgastrostomy stent removal with history of pancreatitis and prior alcohol consumption and pancreatic divisum.  Past Medical History:  Diagnosis Date   Anxiety    DM type 2 (diabetes mellitus, type 2) (HCC)    GERD (gastroesophageal reflux disease)    Heart murmur    saw dr Margreta Journey 06-29-2019 echo done    Hypercholesterolemia    Hypertension    Palpitations    none in a long time as of 10-19-2019   Pancreatitis    S/P colonoscopy 09/2009   Dr. Lovell Sheehan: Moderate diverticulosis throughout colon, otherwise normal    Umbilical hernia    Past Surgical History:  Procedure Laterality Date   BALLOON DILATION N/A 05/02/2022   Procedure: BALLOON DILATION;  Surgeon: Lemar Lofty., MD;  Location: [facility];  Service: Gastroenterology;  Laterality: N/A;   BIOPSY  05/02/2022   Procedure: BIOPSY;  Surgeon: Lemar Lofty., MD;  Location: [facility];  Service: Gastroenterology;;   COLONOSCOPY N/A 12/21/2019   Procedure: COLONOSCOPY;  Surgeon: Franky Macho, MD;  Location: [facility];  Service: Gastroenterology;  Laterality: N/A;   cyst removed from finger  age 34   ESOPHAGOGASTRODUODENOSCOPY (EGD) WITH PROPOFOL N/A 08/13/2021   Procedure: ESOPHAGOGASTRODUODENOSCOPY (EGD) WITH PROPOFOL;  Surgeon: Lanelle Bal, DO;  Location: [facility];  Service: Endoscopy;  Laterality: N/A;  1:30pm   ESOPHAGOGASTRODUODENOSCOPY (EGD) WITH PROPOFOL N/A 05/02/2022   Procedure: ESOPHAGOGASTRODUODENOSCOPY (EGD) WITH PROPOFOL;  Surgeon: Meridee Score Netty Starring., MD;  Location: [facility];  Service: Gastroenterology;  Laterality: N/A;   EUS N/A 05/02/2022   Procedure: UPPER ENDOSCOPIC ULTRASOUND (EUS) RADIAL;  Surgeon: Lemar Lofty., MD;  Location: [facility];  Service: Gastroenterology;  Laterality: N/A;   HERNIA REPAIR      as baby   PANCREATIC STENT PLACEMENT  05/02/2022   Procedure: CYSTGASTROSTOMY;  Surgeon: Mansouraty, Netty Starring., MD;  Location: [facility];  Service: Gastroenterology;;   POLYPECTOMY  12/21/2019   Procedure: POLYPECTOMY;  Surgeon: Franky Macho, MD;  Location: [facility];  Service: Gastroenterology;;   pyloric stenosis repair     as baby, operated at [facility] HERNIA REPAIR N/A 10/29/2019   Procedure: OPEN UMBILICAL HERNIA REPAIR WITH MESH;  Surgeon: Berna Bue, MD;  Location[facility];  Service: General;  Laterality: N/A;   XI ROBOTIC ASSISTED INGUINAL HERNIA REPAIR WITH MESH Left 05/13/2022   Procedure: XI ROBOTIC ASSISTED INGUINAL HERNIA REPAIR WITH MESH;  Surgeon: Lewie Chamber, DO;  Location: [facility];  Service: General;  Laterality: Left;   No current facility-administered medications for this encounter.   No current facility-administered medications for this encounter. No Known Allergies Family History  Problem Relation Age of Onset   Colon cancer Neg Hx    Social History   Socioeconomic History   Marital status: Married    Spouse name: Not on file   Number of children: 2   Years of education: Not on file   Highest education level: Not on file  Occupational History   Occupation: Higher education careers adviser: [employer]    Comment: [employer]  Tobacco Use   Smoking status: Every Day    Packs/day: 1.50    Years: 30.00    Additional pack years: 0.00    Total pack years: 45.00    Types: Cigarettes  Passive exposure: Current   Smokeless tobacco: Never   Tobacco comments:    1 ppd since 3 weeks as of 10-19-2019  Vaping Use   Vaping Use: Never used  Substance and Sexual Activity   Alcohol use: Not Currently    Comment: quit 05/30/21   Drug use: Not Currently   Sexual activity: Not on file  Other Topics Concern   Not on file  Social History Narrative   Not on file   Social Determinants of Health   Financial  Resource Strain: Not on file  Food Insecurity: No Food Insecurity (01/02/2022)   Hunger Vital Sign    Worried About Running Out of Food in the Last Year: Never true    Ran Out of Food in the Last Year: Never true  Transportation Needs: No Transportation Needs (01/02/2022)   PRAPARE - Administrator, Civil Service (Medical): No    Lack of Transportation (Non-Medical): No  Physical Activity: Not on file  Stress: Not on file  Social Connections: Not on file  Intimate Partner Violence: Not At Risk (01/02/2022)   Humiliation, Afraid, Rape, and Kick questionnaire    Fear of Current or Ex-Partner: No    Emotionally Abused: No    Physically Abused: No    Sexually Abused: No    Physical Exam: There were no vitals filed for this visit. There is no height or weight on file to calculate BMI. GEN: NAD EYE: Sclerae anicteric ENT: MMM CV: Non-tachycardic GI: Soft, NT/ND NEURO:  Alert & Oriented x 3  Lab Results: No results for input(s): "WBC", "HGB", "HCT", "PLT" in the last 72 hours. BMET No results for input(s): "NA", "K", "CL", "CO2", "GLUCOSE", "BUN", "CREATININE", "CALCIUM" in the last 72 hours. LFT No results for input(s): "PROT", "ALBUMIN", "AST", "ALT", "ALKPHOS", "BILITOT", "BILIDIR", "IBILI" in the last 72 hours. PT/INR No results for input(s): "LABPROT", "INR" in the last 72 hours.   Impression / Plan: This is a 62 y.o.male who presents for EGD for cystgastrostomy stent removal with history of pancreatitis and prior alcohol consumption and pancreatic divisum.  The risks and benefits of endoscopic evaluation/treatment were discussed with the patient and/or family; these include but are not limited to the risk of perforation, infection, bleeding, missed lesions, lack of diagnosis, severe illness requiring hospitalization, as well as anesthesia and sedation related illnesses.  The patient's history has been reviewed, patient examined, no change in status, and deemed  stable for procedure.  The patient and/or family is agreeable to proceed.    Corliss Parish, MD [facility] Office # [phone]

## 2022-06-10 NOTE — Anesthesia Preprocedure Evaluation (Addendum)
Anesthesia Evaluation  Patient identified by MRN, date of birth, ID band Patient awake    Reviewed: Allergy & Precautions, NPO status , Patient's Chart, lab work & pertinent test results  Airway Mallampati: II  TM Distance: >3 FB Neck ROM: Full    Dental no notable dental hx. (+) Teeth Intact, Dental Advisory Given   Pulmonary Current Smoker and Patient abstained from smoking.   Pulmonary exam normal breath sounds clear to auscultation       Cardiovascular hypertension, Pt. on medications Normal cardiovascular exam Rhythm:Regular Rate:Normal  TTE 2021 1. Left ventricular ejection fraction, by estimation, is 60 to 65%. The  left ventricle has normal function. The left ventricle has no regional  wall motion abnormalities. There is mild left ventricular hypertrophy.  Left ventricular diastolic parameters  are consistent with Grade I diastolic dysfunction (impaired relaxation).   2. Right ventricular systolic function is normal. The right ventricular  size is normal.   3. The mitral valve is normal in structure. No evidence of mitral valve  regurgitation. No evidence of mitral stenosis.   4. The aortic valve is tricuspid. Aortic valve regurgitation is not  visualized. No aortic stenosis is present.   5. The inferior vena cava is normal in size with greater than 50%  respiratory variability, suggesting right atrial pressure of 3 mmHg.     Neuro/Psych  PSYCHIATRIC DISORDERS Anxiety     negative neurological ROS     GI/Hepatic ,GERD  ,,(+)     substance abuse  alcohol use  Endo/Other  diabetes, Type 2, Oral Hypoglycemic Agents    Renal/GU negative Renal ROS  negative genitourinary   Musculoskeletal negative musculoskeletal ROS (+)    Abdominal   Peds  Hematology negative hematology ROS (+)   Anesthesia Other Findings   Reproductive/Obstetrics                             Anesthesia  Physical Anesthesia Plan  ASA: 3  Anesthesia Plan: MAC   Post-op Pain Management:    Induction: Intravenous  PONV Risk Score and Plan: Propofol infusion and Treatment may vary due to age or medical condition  Airway Management Planned: Natural Airway  Additional Equipment:   Intra-op Plan:   Post-operative Plan:   Informed Consent: I have reviewed the patients History and Physical, chart, labs and discussed the procedure including the risks, benefits and alternatives for the proposed anesthesia with the patient or authorized representative who has indicated his/her understanding and acceptance.     Dental advisory given  Plan Discussed with: CRNA  Anesthesia Plan Comments:        Anesthesia Quick Evaluation

## 2022-06-10 NOTE — Telephone Encounter (Signed)
Order has been entered as ordered

## 2022-06-10 NOTE — Telephone Encounter (Signed)
-----   Message from Lemar Lofty., MD sent at 06/10/2022 12:34 PM EDT ----- Regarding: Follow up Kyle Giles, This patient needs a formal KUB 2 view to ensure that the previously placed plastic cystgastrostomy stent is fallen out.  I remove the metal stent today.  He can have this done at [facility].  Nonurgent in the next couple of weeks.  The patient and wife are aware to at [facility] in the next couple of weeks. Thanks. GM

## 2022-06-10 NOTE — Anesthesia Postprocedure Evaluation (Signed)
Anesthesia Post Note  Patient: RED HOLIDAY  Procedure(s) Performed: ESOPHAGOGASTRODUODENOSCOPY (EGD) WITH PROPOFOL BIOPSY STENT REMOVAL     Patient location during evaluation: Endoscopy Anesthesia Type: MAC Level of consciousness: awake and alert Pain management: pain level controlled Vital Signs Assessment: post-procedure vital signs reviewed and stable Respiratory status: spontaneous breathing, nonlabored ventilation, respiratory function stable and patient connected to nasal cannula oxygen Cardiovascular status: blood pressure returned to baseline and stable Postop Assessment: no apparent nausea or vomiting Anesthetic complications: no  No notable events documented.  Last Vitals:  Vitals:   06/10/22 1230 06/10/22 1240  BP: (!) 112/53 (!) 113/50  Pulse: (!) 52 (!) 51  Resp: 11 15  Temp:    SpO2: 97% 99%    Last Pain:  Vitals:   06/10/22 1209  TempSrc: Oral  PainSc:                  Zion Ta L Emiley Digiacomo

## 2022-06-10 NOTE — Op Note (Signed)
[facility] Patient Name: Kyle Giles Procedure Date: 06/10/2022 MRN: [id number] Attending MD: Corliss Parish , MD, [id number] Date of Birth: 03/13/1960 CSN: [id number] Age: 62 Admit Type: Outpatient Procedure:                Upper GI endoscopy Indications:              Stent removal Providers:                Corliss Parish, MD, Norman Clay, RN, Harrington Challenger,                            Technician Referring MD:             Hennie Duos. [name], DO, Corrie Mckusick MD, MD Medicines:                Monitored Anesthesia Care Complications:            No immediate complications. Estimated Blood Loss:     Estimated blood loss was minimal. Procedure:                Pre-Anesthesia Assessment:                           - Prior to the procedure, a History and Physical                            was performed, and patient medications and                            allergies were reviewed. The patient's tolerance of                            previous anesthesia was also reviewed. The risks                            and benefits of the procedure and the sedation                            options and risks were discussed with the patient.                            All questions were answered, and informed consent                            was obtained. Prior Anticoagulants: The patient has                            taken no anticoagulant or antiplatelet agents. ASA                            Grade Assessment: II - A patient with mild systemic                            disease. After reviewing the risks and benefits,  the patient was deemed in satisfactory condition to                            undergo the procedure.                           After obtaining informed consent, the endoscope was                            passed under direct vision. Throughout the                            procedure, the patient's blood pressure, pulse, and                             oxygen saturations were monitored continuously. The                            [id number] ([id number]) Olympus therapeutic endoscope                            was introduced through the mouth, and advanced to                            the second part of duodenum. The upper GI endoscopy                            was accomplished without difficulty. The patient                            tolerated the procedure. Scope In: Scope Out: Findings:      No gross lesions were noted in the majority of the esophagus.      Scattered islands of salmon-colored mucosa were present from 36 to 37       cm. No other visible abnormalities were present. Biopsies were taken       with a cold forceps for histology.      A non-obstructing Schatzki ring was found at the gastroesophageal       junction.      A 2 cm hiatal hernia was present.      A previously placed AXIOS cystgastrostomy stent was found on the       posterior wall of the gastric body. The previously placed double-pigtail       stent was not visible (this was not visible on the CT scan thus       suggesting it has fallen out) AXIOS stent removal was accomplished with       a Raptor grasping device. The pancreatic cyst was intubated and looked       to be showing good viable wall.      Patchy mildly erythematous mucosa without bleeding was found in the       entire examined stomach. Not rebiopsied.      No gross lesions were noted in the duodenal bulb, in the first portion       of the duodenum and in the second portion of the duodenum. Impression:               -  No gross lesions in the majority of the esophagus.                           - Salmon-colored mucosal islands suspicious for                            short-segment Barrett's esophagus found distally.                            Biopsied to rule out Barrett's.                           - Non-obstructing Schatzki ring.                           - 2 cm hiatal  hernia.                           - Pre-existing AXIOS cystgastrostomy stent was                            noted. This was removed.                           - Pancreatic cyst intubated with what appears to be                            good viable tissue.                           - Erythematous mucosa in the stomach (previously                            biopsied).                           - No gross lesions in the duodenal bulb, in the                            first portion of the duodenum and in the second                            portion of the duodenum. Moderate Sedation:      Not Applicable - Patient had care per Anesthesia. Recommendation:           - The patient will be observed post-procedure,                            until all discharge criteria are met.                           - Discharge patient to home.                           - Patient has a contact number available for  emergencies. The signs and symptoms of potential                            delayed complications were discussed with the                            patient. Return to normal activities tomorrow.                            Written discharge instructions were provided to the                            patient.                           - Resume previous diet.                           - Continue present medications.                           - If patient is found to have Barrett's esophagus,                            would recommend initiation of PPI.                           - Observe patient's clinical course.                           - Await pathology results.                           -Obtain a formal KUB 2 view to ensure that the                            previously placed double-pigtail has truly left the                            body (this was not noted on the CT abdomen that was                            recently performed so I suspect it has already                             fallen out into the stool and left the body).                           - Recommend follow-up in clinic to discuss overall                            symptoms with repeat imaging (MRI/MRCP in 3 to 4  months). Will discuss with him based on the                            findings repeat EUS +/- pancreatic ERCP for                            pancreatic divisum. Certainly if the patient                            continues to abstain from all alcohol and has                            recurrent bouts of pancreatitis we need to then                            consider this.                           - Patient will follow-up with primary                            gastroenterologist in the interim.                           - The findings and recommendations were discussed                            with the patient.                           - The findings and recommendations were discussed                            with the patient's family. Procedure Code(s):        --- Professional ---                           [id number], Esophagogastroduodenoscopy, flexible,                            transoral; with removal of foreign body(s)                           43239, Esophagogastroduodenoscopy, flexible,                            transoral; with biopsy, single or multiple Diagnosis Code(s):        --- Professional ---                           K22.89, Other specified disease of esophagus                           K22.2, Esophageal obstruction                           K44.9,  Diaphragmatic hernia without obstruction or                            gangrene                           Z97.8, Presence of other specified devices                           K31.89, Other diseases of stomach and duodenum                           Z46.59, Encounter for fitting and adjustment of                            other gastrointestinal appliance and device CPT copyright  2022 [employer]. All rights reserved. The codes documented in this report are preliminary and upon coder review may  be revised to meet current compliance requirements. Corliss Parish, MD 06/10/2022 12:20:41 PM Number of Addenda: 0

## 2022-06-10 NOTE — Transfer of Care (Signed)
Immediate Anesthesia Transfer of Care Note  Patient: Kyle Giles  Procedure(s) Performed: ESOPHAGOGASTRODUODENOSCOPY (EGD) WITH PROPOFOL BIOPSY STENT REMOVAL  Patient Location: PACU and Endoscopy Unit  Anesthesia Type:MAC  Level of Consciousness: awake, alert , and oriented  Airway & Oxygen Therapy: Patient Spontanous Breathing  Post-op Assessment: Report given to RN and Post -op Vital signs reviewed and stable  Post vital signs: Reviewed and stable  Last Vitals:  Vitals Value Taken Time  BP 94/43 06/10/22 1214  Temp 36.5 C 06/10/22 1209  Pulse 50 06/10/22 1216  Resp 16 06/10/22 1216  SpO2 99 % 06/10/22 1216  Vitals shown include unvalidated device data.  Last Pain:  Vitals:   06/10/22 1209  TempSrc: Oral  PainSc:          Complications: No notable events documented.

## 2022-06-10 NOTE — Discharge Instructions (Signed)
YOU HAD AN ENDOSCOPIC PROCEDURE TODAY: Refer to the procedure report and other information in the discharge instructions given to you for any specific questions about what was found during the examination. If this information does not answer your questions, please call [place] office at [phone] to clarify.  ° °YOU SHOULD EXPECT: Some feelings of bloating in the abdomen. Passage of more gas than usual. Walking can help get rid of the air that was put into your GI tract during the procedure and reduce the bloating.  ° °DIET: Your first meal following the procedure should be a light meal and then it is ok to progress to your normal diet. A half-sandwich or bowl of soup is an example of a good first meal. Heavy or fried foods are harder to digest and may make you feel nauseous or bloated. Drink plenty of fluids but you should avoid alcoholic beverages for 24 hours. ° °ACTIVITY: Your care partner should take you home directly after the procedure. You should plan to take it easy, moving slowly for the rest of the day. You can resume normal activity the day after the procedure however YOU SHOULD NOT DRIVE, use power tools, machinery or perform tasks that involve climbing or major physical exertion for 24 hours (because of the sedation medicines used during the test).  ° °SYMPTOMS TO REPORT IMMEDIATELY: °A gastroenterologist can be reached at any hour. Please call [phone]  for any of the following symptoms:  ° °Following upper endoscopy (EGD, EUS, ERCP, esophageal dilation) °Vomiting of blood or coffee ground material  °New, significant abdominal pain  °New, significant chest pain or pain under the shoulder blades  °Painful or persistently difficult swallowing  °New shortness of breath  °Black, tarry-looking or red, bloody stools ° °FOLLOW UP:  °If any biopsies were taken you will be contacted by phone or by letter within the next 1-3 weeks. Call [phone]  if you have not heard about the biopsies in 3 weeks.    °Please also call with any specific questions about appointments or follow up tests. ° °

## 2022-06-12 ENCOUNTER — Ambulatory Visit (HOSPITAL_COMMUNITY)
Admission: RE | Admit: 2022-06-12 | Discharge: 2022-06-12 | Disposition: A | Discharge status: Home / Self Care | Payer: Commercial Managed Care - PPO | Source: Ambulatory Visit | Attending: Gastroenterology | Admitting: Gastroenterology

## 2022-06-12 DIAGNOSIS — T85528A Displacement of other gastrointestinal prosthetic devices, implants and grafts, initial encounter: Secondary | ICD-10-CM | POA: Diagnosis not present

## 2022-06-12 DIAGNOSIS — K861 Other chronic pancreatitis: Secondary | ICD-10-CM | POA: Diagnosis not present

## 2022-06-12 DIAGNOSIS — K863 Pseudocyst of pancreas: Secondary | ICD-10-CM | POA: Insufficient documentation

## 2022-06-12 LAB — SURGICAL PATHOLOGY

## 2022-06-13 ENCOUNTER — Encounter (HOSPITAL_COMMUNITY): Payer: Self-pay | Admitting: Gastroenterology

## 2022-06-14 ENCOUNTER — Encounter: Payer: Self-pay | Admitting: Gastroenterology

## 2022-06-15 ENCOUNTER — Other Ambulatory Visit (HOSPITAL_COMMUNITY): Payer: Self-pay

## 2022-06-17 ENCOUNTER — Other Ambulatory Visit (HOSPITAL_COMMUNITY): Payer: Self-pay

## 2022-07-02 NOTE — Progress Notes (Signed)
GI Office Note    Referring Provider: Assunta Found, MD Primary Care Physician:  Assunta Found, MD Primary Gastroenterologist: Hennie Duos. [name], DO  Date:  07/03/2022  ID:  Kyle Giles, DOB 1960/08/26, MRN [id number]   Chief Complaint   Chief Complaint  Patient presents with   Follow-up    Follow up ED and procedure   History of Present Illness  AISAIAH Giles is a 62 y.o. male with a history of chronic pancreatitis c/b pseudocyst, hepatic steatosis, anxiety, HTN, diabetes presenting today for follow up.   Colonoscopy in 2021 by Dr. Lovell Sheehan which showed 1 hyperplastic polyp.   Hospital admission in March 2023 for acute pancreatitis.  Felt to be alcohol induced as patient was simply drinking with 6 beers as well as liquor daily.  CT imaging showed fatty liver and with mention of duodenitis likely secondary to his pancreatitis.  Denied NSAID use.Marland Kitchen  Has some abnormal LFTs during hospitalization.  Viral hepatitis panel negative.  Improved shortly with conservative measures and was discharged home.  Recovered well but then started having recurrent epigastric pain which prompted an ER visit in April 2023 which she was discharged home.  He had began taking Nexium daily.   EGD July 2023: -Normal esophagus, stomach, and duodenum -Continue Nexium 20 mg daily   Hospitalized 01/02/2022 - 01/05/22 for acute on chronic pancreatitis. Presenting with 4 days of abdominal pain and advised to proceed to the hospital with recommendation of his PCP regarding high lipase and AKI.  He denied any recent medication changes.  Prior workup with normal triglycerides and calcium.  At this time he also had acute kidney injury.  Creatinine 3.6 on admission.  Likely due to dehydration.  CT imaging as outlined below.  Advised to start taking pancreatic enzymes once able to tolerate solids (Creon 48,000 units with meals and 24,000 units with snacks).  Also recommended complete alcohol cessation.  Outpatient follow-up  in 4 weeks.  Dedicated pancreatic CT in 6-8 weeks.   CT A/P 01/02/2022: -5.7 cm cystic lesion in tail of pancreas likely pseudocyst -Peripancreatic fluid seen -Peripancreatic fluid and stranding extending from the pseudocyst to the lesser curvature of the stomach -No free air to suggest perforation -Mild prominence of main pancreatic duct increased from prior now measuring approximately 5 mm -Coarse calcifications in the pancreatic head -Hepatobiliary system within normal limits -Indistinctness of the wall of the lesser curvature of the stomach which appears, confluent with peripancreatic stranding. -Extensive colonic diverticulosis without diverticulitis   Patient seen in the office 02/13/2022 and 03/05/2022.  At the beginning of January his GERD was fairly well-controlled.  Was having significant major fatigue and falling asleep during the day.  Recently resigned from his job.  Had a good appetite and no weight loss.  Also not experiencing any diarrhea, primarily having constipation using MiraLAX.  Denied any melena or BRBPR.  Had occasional abdominal pain but also having a lot more gas.  Occasional NSAID use.  Denied any pica.  Taking vitamin D.  Advised to check CBC, iron panel, B12, folate, and vitamin A, D, E and K.  He was scheduled for CT pancreatic protocol.  Refilled Nexium.  Advised to continue MiraLAX as needed.  At the end of January patient reported having tenderness vomiting a CT scan then had more extensive severe pain that night at dinner.  Reducing to a blander diet was helping.  Had a couple days without pain but then pain returned intermittently.  He denies any nausea  or vomiting and has been drinking more water.  Also complaining of pain to his left groin area stating a possible hernia that had grown in size.  On exam he did have a golf ball sized soft tissue mass that was nontender.  He was referred to general surgery to discuss hernia repair.  He was referred to Dr. Meridee Score for  EUS and possible cyst gastrostomy.  As stated below MRI/MRCP ordered per his recommendation.   CT A/P with and without contrast 02/26/22: -Interval enlargement of unilocular cystic lesion along the body of the pancreas most consistent with pancreatic pseudocyst -No acute pancreatic inflammation -Ductus divisum variant ductal anatomy with calcification at the orifice of the accessory duct.  No significant ductal dilation currently.   Per Dr. Meridee Score fluid collection appears to be large enough to drain which can be done via endoscopic ultrasound  given his pain returning this would likely be best. He has some pancreatic anatomy which can put him at risk for recurrent pancreatitis (this is called pancreatic divisum) and further evaluation can be performed with an MRI/MRCP that would allow a better look at his anatomy for Dr. Meridee Score to discuss possible treatment options.    MRI/MRCP 03/27/2022: -Decrease size of 6.2 cm pseudocyst adjacent to pancreatic tail -Findings of chronic pancreatitis and pancreas divisum -No evidence of acute pancreatic inflammation. -Advised to keep follow-up with EUS with Dr. Meridee Score already planned for the end of March  Last office visit 04/10/22.  Continued intermittent epigastric pain as well as left lower quadrant/inguinal pain.  Has a good appetite.  Symptoms better controlled without overeating.  Still with some weight loss and occasional constipation but no diarrhea.  Continue on Nexium once daily with some intermittent reflux symptoms.  No alcohol use or NSAIDs.  Advised to keep follow-up with surgery for inguinal hernia repair.  Follow low-fat diet.  Continue PPI once daily.  Focus on protein intake for nutrition.  Monitor weight.  EGD/EUS 05/02/22 EGD impression: - No gross lesions in the entire esophagus. Z- line regular, 40 cm from the incisors. - Erythematous mucosa in the gastric body. No other gross lesions in the entire stomach. Biopsied. - No gross  lesions in the duodenal bulb. - Duodenal deformity in the duodenal sweep ( as noted above query healed groove pancreatitis versus healed ulcer) - Normal major papilla. - Congested duodenal mucosa in the region of the minor papilla ( an area suggestive of a pancreatic duct was noted but the amount of swelling in this region makes finding of the true minor papilla difficult)  EUS impression: - A cystic lesion was seen in the pancreatic body and pancreatic tail. Tissue has not been obtained. However, the endosonographic appearance is consistent with a pancreatic pseudocyst. Cystgastrostomy created ( 15 mm AXIOS with 7 [place] by 5 cm double- pigtail through it) . - The pancreatic duct had a dilated endosonographic appearance in the pancreatic head and genu of the pancreas. The pancreatic duct measured up to 4 mm in diameter. - Findings suggestive of pancreatic stones were identified in the pancreatic head and accessory pancreatic duct. It was difficult to evaluate the rest of the pancreatic duct as a result of the pseudocyst. - Pancreatic parenchymal abnormalities consisting of lobularity and hyperechoic strands were noted in the pancreatic head and genu of the pancreas. It was difficult to evaluate the rest of the pancreas parenchyma as a result of the pseudocyst. - Hyperechoic material consistent with sludge was visualized endosonographically in the gallbladder. - No  malignant- appearing lymph nodes were visualized in the celiac region ( level 20) , peripancreatic region and porta hepatis region.  Left inguinal hernia repair on 05/13/22.   CT abdomen with contrast 05/31/22: IMPRESSION: 1. Sequela of prior pancreatitis with interval placement of cystogastrostomy stent with resolution of the previously noted pseudocyst in the pancreatic body/tail. 2. Peripheral subcentimeter hypodensity measuring 8 mm in the pancreatic head is new from 03/27/2022 and may represent a side branch intraductal papillary mucinous  neoplasms (IPMN) or small pseudocyst. Consider contrast-enhanced MRI/MRCP in 1 year.  Repeat EGD 06/10/22 with Dr. Meridee Score: -salmon colored mucosa to islands suspicious or barrett's s/p biopsy.  -non obstructing schatzki ring -2 cm hiatal hernia -Pre-existing Axios cystogastrostomy stent was noted and removed -Mild gastritis not rebiopsied -Normal duodenum -Recommended follow-up in the clinic to discuss several symptoms with repeat MRI/MRCP in 3-4 months.   Advised KUB to ensure passage of plastic cystogastrostomy stents.   KUB 06/12/22: -cystogastrostomy stents no longer seen -chronic calcific pancreatitis   Today: Feeling very well compared to prior. Fatigue has improved. Back doing yard work and whatever he wants to do but stamina is slowly increasing. Quit taking Nexium in march and has not really had any issues since then. Keeps Mylanta at home and has probably taken a handful of times since March.   Dealt with a hard time physically and emotionally in April.   States the inguinal hernia pain took longer to go away then he anticipated but overall doing very well from that standpoint now.   No diarrhea, N/V, abdominal pain. Does have some intermittent constipation and keep miralax at home for this.   Wt Readings from Last 3 Encounters:  07/03/22 115 lb (52.2 kg)  06/10/22 110 lb (49.9 kg)  05/28/22 109 lb (49.4 kg)  04/10/22  110lb   Current Outpatient Medications  Medication Sig Dispense Refill   ALPRAZolam (XANAX) 1 MG tablet Take 1 tablet (1 mg total) by mouth 3 (three) times daily. (Patient taking differently: Take 1 mg by mouth daily as needed for anxiety.) 270 tablet 0   atorvastatin (LIPITOR) 10 MG tablet Take 1 tablet (10 mg total) by mouth daily. 90 tablet 3   Blood Glucose Monitoring Suppl (FREESTYLE LITE) w/Device KIT Use to test blood sugar daily 1 kit 0   Cholecalciferol (VITAMIN D) 50 MCG (2000 UT) tablet Take 2,000 Units by mouth 2 (two) times daily.      diltiazem (CARDIZEM CD) 360 MG 24 hr capsule Take 1 capsule (360 mg total) by mouth every evening. 90 capsule 3   fenofibrate 160 MG tablet Take 1 tablet (160 mg total) by mouth daily. 90 tablet 2   glucose blood test strip Use to test blood sugar daily 100 each 0   Lancets (FREESTYLE) lancets Use to test blood sugar daily 100 each 0   metFORMIN (GLUCOPHAGE) 500 MG tablet Take 0.5 tablets (250 mg total) by mouth 2 (two) times daily. 90 tablet 1   olmesartan-hydrochlorothiazide (BENICAR HCT) 40-25 MG tablet Take 1 tablet by mouth daily. 90 tablet 1   Propylene Glycol (SYSTANE COMPLETE) 0.6 % SOLN Place 1 drop into both eyes daily as needed (dry eyes).     cetirizine (ZYRTEC) 10 MG tablet Take 1 tablet (10 mg total) by mouth daily. (Patient not taking: Reported on 06/06/2022) 90 tablet 3   docusate sodium (COLACE) 100 MG capsule Take 1 capsule (100 mg total) by mouth 2 (two) times daily. (Patient not taking: Reported on 06/06/2022) 60 capsule  2   No current facility-administered medications for this visit.    Past Medical History:  Diagnosis Date   Anxiety    DM type 2 (diabetes mellitus, type 2) (HCC)    GERD (gastroesophageal reflux disease)    Heart murmur    saw dr Margreta Journey 06-29-2019 echo done    Hypercholesterolemia    Hypertension    Palpitations    Kyle Giles in a long time as of 10-19-2019   Pancreatitis    S/P colonoscopy 09/2009   Dr. Lovell Sheehan: Moderate diverticulosis throughout colon, otherwise normal    Umbilical hernia     Past Surgical History:  Procedure Laterality Date   BALLOON DILATION N/A 05/02/2022   Procedure: BALLOON DILATION;  Surgeon: Lemar Lofty., MD;  Location: [facility];  Service: Gastroenterology;  Laterality: N/A;   BIOPSY  05/02/2022   Procedure: BIOPSY;  Surgeon: Meridee Score Netty Starring., MD;  Location: [facility];  Service: Gastroenterology;;   BIOPSY  06/10/2022   Procedure: BIOPSY;  Surgeon: Lemar Lofty., MD;  Location: [facility];   Service: Gastroenterology;;   COLONOSCOPY N/A 12/21/2019   Procedure: COLONOSCOPY;  Surgeon: Franky Macho, MD;  Location: [facility];  Service: Gastroenterology;  Laterality: N/A;   cyst removed from finger  age 50   ESOPHAGOGASTRODUODENOSCOPY (EGD) WITH PROPOFOL N/A 08/13/2021   Procedure: ESOPHAGOGASTRODUODENOSCOPY (EGD) WITH PROPOFOL;  Surgeon: Lanelle Bal, DO;  Location: [facility];  Service: Endoscopy;  Laterality: N/A;  1:30pm   ESOPHAGOGASTRODUODENOSCOPY (EGD) WITH PROPOFOL N/A 05/02/2022   Procedure: ESOPHAGOGASTRODUODENOSCOPY (EGD) WITH PROPOFOL;  Surgeon: Meridee Score Netty Starring., MD;  Location: [facility];  Service: Gastroenterology;  Laterality: N/A;   ESOPHAGOGASTRODUODENOSCOPY (EGD) WITH PROPOFOL N/A 06/10/2022   Procedure: ESOPHAGOGASTRODUODENOSCOPY (EGD) WITH PROPOFOL;  Surgeon: Meridee Score Netty Starring., MD;  Location: [facility];  Service: Gastroenterology;  Laterality: N/A;   EUS N/A 05/02/2022   Procedure: UPPER ENDOSCOPIC ULTRASOUND (EUS) RADIAL;  Surgeon: Lemar Lofty., MD;  Location: [facility];  Service: Gastroenterology;  Laterality: N/A;   HERNIA REPAIR     as baby   PANCREATIC STENT PLACEMENT  05/02/2022   Procedure: CYSTGASTROSTOMY;  Surgeon: Mansouraty, Netty Starring., MD;  Location: [facility];  Service: Gastroenterology;;   POLYPECTOMY  12/21/2019   Procedure: POLYPECTOMY;  Surgeon: Franky Macho, MD;  Location: [facility];  Service: Gastroenterology;;   pyloric stenosis repair     as baby, operated at [facility] REMOVAL  06/10/2022   Procedure: STENT REMOVAL;  Surgeon: Lemar Lofty., MD;  Location: [facility];  Service: Gastroenterology;;   UMBILICAL HERNIA REPAIR N/A 10/29/2019   Procedure: OPEN UMBILICAL HERNIA REPAIR WITH MESH;  Surgeon: Berna Bue, MD;  Location: [facility] ;  Service: General;  Laterality: N/A;   XI ROBOTIC ASSISTED INGUINAL HERNIA REPAIR WITH MESH Left 05/13/2022   Procedure: XI  ROBOTIC ASSISTED INGUINAL HERNIA REPAIR WITH MESH;  Surgeon: Lewie Chamber, DO;  Location: [facility];  Service: General;  Laterality: Left;    Family History  Problem Relation Age of Onset   Colon cancer Neg Hx     Allergies as of 07/03/2022   (No Known Allergies)    Social History   Socioeconomic History   Marital status: Married    Spouse name: Not on file   Number of children: 2   Years of education: Not on file   Highest education level: Not on file  Occupational History   Occupation: Higher education careers adviser: [employer]  Comment: [employer]  Tobacco Use   Smoking status: Every Day    Packs/day: 1.50    Years: 30.00    Additional pack years: 0.00    Total pack years: 45.00    Types: Cigarettes    Passive exposure: Current   Smokeless tobacco: Never   Tobacco comments:    1 ppd since 3 weeks as of 10-19-2019  Vaping Use   Vaping Use: Never used  Substance and Sexual Activity   Alcohol use: Not Currently    Comment: quit 05/30/21   Drug use: Not Currently   Sexual activity: Not on file  Other Topics Concern   Not on file  Social History Narrative   Not on file   Social Determinants of Health   Financial Resource Strain: Not on file  Food Insecurity: No Food Insecurity (01/02/2022)   Hunger Vital Sign    Worried About Running Out of Food in the Last Year: Never true    Ran Out of Food in the Last Year: Never true  Transportation Needs: No Transportation Needs (01/02/2022)   PRAPARE - Administrator, Civil Service (Medical): No    Lack of Transportation (Non-Medical): No  Physical Activity: Not on file  Stress: Not on file  Social Connections: Not on file     Review of Systems   Gen: Denies fever, chills, anorexia. Denies fatigue, weakness, weight loss.  CV: Denies chest pain, palpitations, syncope, peripheral edema, and claudication. Resp: Denies dyspnea at rest, cough, wheezing, coughing up blood, and  pleurisy. GI: See HPI Derm: Denies rash, itching, dry skin Psych: Denies depression, anxiety, memory loss, confusion. No homicidal or suicidal ideation.  Heme: Denies bruising, bleeding, and enlarged lymph nodes.   Physical Exam   BP 138/71   Pulse 69   Temp 97.7 F (36.5 C)   Ht 5\' 2"  (1.575 m)   Wt 115 lb (52.2 kg)   BMI 21.03 kg/m   General:   Alert and oriented. No distress noted. Pleasant and cooperative.  Head:  Normocephalic and atraumatic. Eyes:  Conjuctiva clear without scleral icterus. Mouth:  Oral mucosa pink and moist. Good dentition. No lesions. Lungs:  Clear to auscultation bilaterally. No wheezes, rales, or rhonchi. No distress.  Heart:  S1, S2 present without murmurs appreciated.  Abdomen:  +BS, soft, non-tender and non-distended. No rebound or guarding. No HSM or masses noted. Rectal: deferred Msk:  Symmetrical without gross deformities. Normal posture. Extremities:  Without edema. Neurologic:  Alert and  oriented x4 Psych:  Alert and cooperative. Normal mood and affect.   Assessment  Kyle Giles is a 62 y.o. male with a history of chronic pancreatitis c/b pseudocyst, hepatic steatosis, anxiety, HTN, diabetes presenting today for follow up.   Pancreas divisum, pancreatitis: 2 previous hospitalizations for pancreatitis in 2023.  Had multiple imaging studies with evidence of ongoing pseudocyst and coarse calcifications representing chronic pancreatitis as well as CT in January 2024 with anatomy consistent with pancreas divisum.  Follow-up MRI/MRCP revealed ongoing pseudocyst in the pancreatic tail as well as pancreas divisum.  Since his last visit he has undergone EGD/EUS with cystogastrostomy performed with stent placement and identified pancreatic stones as well as sludge within the gallbladder.  Cyst gastrostomy stent was present for about 6 weeks and was subsequently removed on repeat EGD.  Follow-up imaging revealed passage of plastic cystogastrostomy stents.   Plan to repeat MRI/MRCP in August to reevaluate pancreatic abnormality and discuss possible treatment for pancreas divisum.  He  overall is doing very well without any abdominal pain, diarrhea, or nausea.  Appetite has improved and he is gaining weight.  GERD: Currently well-controlled since EGD/EUS.  Has not taken any Nexium since then has only had mild symptoms secondary to dietary choices.  May use Tums or Mylanta as needed.   LLQ pain, left inguinal hernia: Overall doing well post hernia repair.  Denies any lower abdominal pain, just some mild tenderness at times.  Constipation: Well-controlled with MiraLAX as needed.  And as needed also take a stool softener as needed if stools are hard.  PLAN    Low fat diet MRI/MRCP in August 2024 Continue alcohol cessation Follow-up in 3-4 months with Dr. Meridee Score need to discuss treatment for pancreas divisum. (On recall for F/u with them) Continue stool softener or MiraLAX as needed for constipation May continue to use Tums or Mylanta as needed for heartburn Follow up in 6 months    Brooke Bonito, MSN, FNP-BC, AGACNP-BC [facility]

## 2022-07-03 ENCOUNTER — Ambulatory Visit (INDEPENDENT_AMBULATORY_CARE_PROVIDER_SITE_OTHER): Payer: Commercial Managed Care - PPO | Admitting: Gastroenterology

## 2022-07-03 ENCOUNTER — Encounter: Payer: Self-pay | Admitting: Gastroenterology

## 2022-07-03 VITALS — BP 138/71 | HR 69 | Temp 97.7°F | Ht 62.0 in | Wt 115.0 lb

## 2022-07-03 DIAGNOSIS — K863 Pseudocyst of pancreas: Secondary | ICD-10-CM | POA: Diagnosis not present

## 2022-07-03 DIAGNOSIS — Q453 Other congenital malformations of pancreas and pancreatic duct: Secondary | ICD-10-CM | POA: Diagnosis not present

## 2022-07-03 DIAGNOSIS — K5900 Constipation, unspecified: Secondary | ICD-10-CM | POA: Diagnosis not present

## 2022-07-03 DIAGNOSIS — R5383 Other fatigue: Secondary | ICD-10-CM

## 2022-07-03 DIAGNOSIS — K861 Other chronic pancreatitis: Secondary | ICD-10-CM

## 2022-07-03 DIAGNOSIS — K219 Gastro-esophageal reflux disease without esophagitis: Secondary | ICD-10-CM

## 2022-07-03 DIAGNOSIS — R1013 Epigastric pain: Secondary | ICD-10-CM | POA: Diagnosis not present

## 2022-07-03 NOTE — Patient Instructions (Signed)
May continue to use a stool softener or MiraLAX as needed for mild constipation.  May benefit from a high-fiber diet if this continues to occur.  You may continue to use Tums or Mylanta as needed for heartburn.  Continue to follow a low-fat diet.  We will plan to get you scheduled for an MRI/MRCP in August 2024 either with [place] or scheduled by [facility].  Continue to avoid alcohol.  I will reach out to Dr. Elesa Hacker office to see if they can contact you regarding a follow-up appointment in 3-4 months.  It was a pleasure to see you today. I want to create trusting relationships with patients. If you receive a survey regarding your visit,  I greatly appreciate you taking time to fill this out on paper or through your MyChart. I value your feedback.  Brooke Bonito, MSN, FNP-BC, AGACNP-BC [facility]

## 2022-07-19 ENCOUNTER — Other Ambulatory Visit (HOSPITAL_COMMUNITY): Payer: Self-pay

## 2022-07-19 MED ORDER — DILTIAZEM HCL ER COATED BEADS 360 MG PO CP24
360.0000 mg | ORAL_CAPSULE | Freq: Every evening | ORAL | 3 refills | Status: DC
  Filled 2022-07-19: qty 90, 90d supply, fill #0
  Filled 2022-10-21: qty 90, 90d supply, fill #1
  Filled 2023-01-13: qty 90, 90d supply, fill #2
  Filled 2023-04-20: qty 90, 90d supply, fill #3

## 2022-07-22 ENCOUNTER — Other Ambulatory Visit (HOSPITAL_COMMUNITY): Payer: Self-pay

## 2022-08-12 ENCOUNTER — Other Ambulatory Visit: Payer: Self-pay

## 2022-08-12 MED ORDER — OLMESARTAN MEDOXOMIL-HCTZ 40-25 MG PO TABS
1.0000 | ORAL_TABLET | Freq: Every day | ORAL | 1 refills | Status: DC
  Filled 2022-08-12: qty 90, 90d supply, fill #0
  Filled 2022-11-18: qty 90, 90d supply, fill #1

## 2022-08-13 ENCOUNTER — Other Ambulatory Visit (HOSPITAL_COMMUNITY): Payer: Self-pay

## 2022-08-17 ENCOUNTER — Other Ambulatory Visit (HOSPITAL_COMMUNITY): Payer: Self-pay

## 2022-08-17 ENCOUNTER — Other Ambulatory Visit: Payer: Self-pay | Admitting: Gastroenterology

## 2022-08-17 DIAGNOSIS — K219 Gastro-esophageal reflux disease without esophagitis: Secondary | ICD-10-CM

## 2022-08-21 ENCOUNTER — Other Ambulatory Visit (HOSPITAL_COMMUNITY): Payer: Self-pay

## 2022-08-22 ENCOUNTER — Other Ambulatory Visit (HOSPITAL_COMMUNITY): Payer: Self-pay

## 2022-08-22 DIAGNOSIS — E119 Type 2 diabetes mellitus without complications: Secondary | ICD-10-CM | POA: Diagnosis not present

## 2022-08-22 DIAGNOSIS — R5383 Other fatigue: Secondary | ICD-10-CM | POA: Diagnosis not present

## 2022-08-22 DIAGNOSIS — E785 Hyperlipidemia, unspecified: Secondary | ICD-10-CM | POA: Diagnosis not present

## 2022-08-22 MED ORDER — ALPRAZOLAM 1 MG PO TABS
1.0000 mg | ORAL_TABLET | Freq: Three times a day (TID) | ORAL | 0 refills | Status: DC
  Filled 2022-08-22: qty 270, 90d supply, fill #0

## 2022-08-23 ENCOUNTER — Other Ambulatory Visit (HOSPITAL_COMMUNITY): Payer: Self-pay

## 2022-09-13 ENCOUNTER — Other Ambulatory Visit (HOSPITAL_COMMUNITY): Payer: Self-pay

## 2022-09-13 MED ORDER — METFORMIN HCL 500 MG PO TABS
250.0000 mg | ORAL_TABLET | Freq: Two times a day (BID) | ORAL | 1 refills | Status: DC
  Filled 2022-09-13: qty 90, 90d supply, fill #0
  Filled 2022-12-09 – 2022-12-12 (×2): qty 90, 90d supply, fill #1

## 2022-09-26 ENCOUNTER — Ambulatory Visit (HOSPITAL_COMMUNITY)
Admission: RE | Admit: 2022-09-26 | Discharge: 2022-09-26 | Disposition: A | Discharge status: Home / Self Care | Payer: Commercial Managed Care - PPO | Source: Ambulatory Visit | Attending: Family Medicine | Admitting: Family Medicine

## 2022-09-26 ENCOUNTER — Other Ambulatory Visit (HOSPITAL_COMMUNITY): Payer: Self-pay | Admitting: Family Medicine

## 2022-09-26 DIAGNOSIS — M549 Dorsalgia, unspecified: Secondary | ICD-10-CM | POA: Insufficient documentation

## 2022-09-26 DIAGNOSIS — J439 Emphysema, unspecified: Secondary | ICD-10-CM | POA: Diagnosis not present

## 2022-09-26 DIAGNOSIS — F17200 Nicotine dependence, unspecified, uncomplicated: Secondary | ICD-10-CM | POA: Diagnosis not present

## 2022-10-21 ENCOUNTER — Other Ambulatory Visit (HOSPITAL_COMMUNITY): Payer: Self-pay

## 2022-10-23 ENCOUNTER — Telehealth: Payer: Self-pay | Admitting: *Deleted

## 2022-10-23 NOTE — Telephone Encounter (Signed)
Pt called and states he has been having abdominal pains since July. He states he hurt his back doing yard work and that is when the pain started in his stomach. The pain is not all the time it comes and goes. It's a dull pain. He tried taking Nexium and Mylanta and that did not help. The only thing that helps a little is Advil. Please advise. You can send him a MyChart message.

## 2022-10-23 NOTE — Telephone Encounter (Signed)
Noted  

## 2022-10-24 ENCOUNTER — Other Ambulatory Visit: Payer: Self-pay

## 2022-10-28 ENCOUNTER — Other Ambulatory Visit: Payer: Self-pay

## 2022-10-28 ENCOUNTER — Other Ambulatory Visit (HOSPITAL_COMMUNITY): Payer: Self-pay

## 2022-10-29 ENCOUNTER — Other Ambulatory Visit (HOSPITAL_COMMUNITY): Payer: Self-pay

## 2022-10-29 DIAGNOSIS — E1165 Type 2 diabetes mellitus with hyperglycemia: Secondary | ICD-10-CM | POA: Diagnosis not present

## 2022-10-29 DIAGNOSIS — Z6820 Body mass index (BMI) 20.0-20.9, adult: Secondary | ICD-10-CM | POA: Diagnosis not present

## 2022-10-29 DIAGNOSIS — F1020 Alcohol dependence, uncomplicated: Secondary | ICD-10-CM | POA: Diagnosis not present

## 2022-10-29 DIAGNOSIS — M549 Dorsalgia, unspecified: Secondary | ICD-10-CM | POA: Diagnosis not present

## 2022-10-29 DIAGNOSIS — R109 Unspecified abdominal pain: Secondary | ICD-10-CM | POA: Diagnosis not present

## 2022-10-29 DIAGNOSIS — K861 Other chronic pancreatitis: Secondary | ICD-10-CM | POA: Diagnosis not present

## 2022-10-29 DIAGNOSIS — I10 Essential (primary) hypertension: Secondary | ICD-10-CM | POA: Diagnosis not present

## 2022-11-11 ENCOUNTER — Other Ambulatory Visit (HOSPITAL_COMMUNITY): Payer: Self-pay

## 2022-11-11 MED ORDER — FENOFIBRATE 160 MG PO TABS
160.0000 mg | ORAL_TABLET | Freq: Every day | ORAL | 2 refills | Status: DC
  Filled 2022-11-11: qty 90, 90d supply, fill #0
  Filled 2023-03-10: qty 90, 90d supply, fill #1
  Filled 2023-06-08: qty 90, 90d supply, fill #2

## 2022-11-18 ENCOUNTER — Other Ambulatory Visit (HOSPITAL_COMMUNITY): Payer: Self-pay

## 2022-11-18 DIAGNOSIS — E785 Hyperlipidemia, unspecified: Secondary | ICD-10-CM | POA: Insufficient documentation

## 2022-11-18 DIAGNOSIS — I129 Hypertensive chronic kidney disease with stage 1 through stage 4 chronic kidney disease, or unspecified chronic kidney disease: Secondary | ICD-10-CM | POA: Diagnosis not present

## 2022-11-18 DIAGNOSIS — N1832 Chronic kidney disease, stage 3b: Secondary | ICD-10-CM | POA: Insufficient documentation

## 2022-11-18 DIAGNOSIS — R809 Proteinuria, unspecified: Secondary | ICD-10-CM | POA: Diagnosis not present

## 2022-11-18 DIAGNOSIS — E1122 Type 2 diabetes mellitus with diabetic chronic kidney disease: Secondary | ICD-10-CM | POA: Diagnosis not present

## 2022-11-18 DIAGNOSIS — E875 Hyperkalemia: Secondary | ICD-10-CM | POA: Insufficient documentation

## 2022-11-18 DIAGNOSIS — R8290 Unspecified abnormal findings in urine: Secondary | ICD-10-CM | POA: Diagnosis not present

## 2022-11-18 MED ORDER — JARDIANCE 10 MG PO TABS
10.0000 mg | ORAL_TABLET | Freq: Every morning | ORAL | 11 refills | Status: DC
  Filled 2022-11-18: qty 30, 30d supply, fill #0
  Filled 2022-12-09 – 2022-12-12 (×2): qty 30, 30d supply, fill #1
  Filled 2023-01-13: qty 30, 30d supply, fill #2
  Filled 2023-02-12: qty 30, 30d supply, fill #3
  Filled 2023-03-10: qty 30, 30d supply, fill #4
  Filled 2023-04-12 – 2023-04-14 (×2): qty 30, 30d supply, fill #5
  Filled 2023-05-12: qty 30, 30d supply, fill #6
  Filled 2023-06-08: qty 30, 30d supply, fill #7
  Filled 2023-07-09: qty 30, 30d supply, fill #8
  Filled 2023-08-10: qty 30, 30d supply, fill #9
  Filled 2023-09-01: qty 30, 30d supply, fill #10
  Filled ????-??-??: fill #10

## 2022-11-19 ENCOUNTER — Other Ambulatory Visit (HOSPITAL_COMMUNITY): Payer: Self-pay

## 2022-11-19 DIAGNOSIS — E1122 Type 2 diabetes mellitus with diabetic chronic kidney disease: Secondary | ICD-10-CM | POA: Diagnosis not present

## 2022-11-19 DIAGNOSIS — E785 Hyperlipidemia, unspecified: Secondary | ICD-10-CM | POA: Diagnosis not present

## 2022-11-19 DIAGNOSIS — R8290 Unspecified abnormal findings in urine: Secondary | ICD-10-CM | POA: Diagnosis not present

## 2022-11-19 DIAGNOSIS — R809 Proteinuria, unspecified: Secondary | ICD-10-CM | POA: Diagnosis not present

## 2022-11-19 DIAGNOSIS — I129 Hypertensive chronic kidney disease with stage 1 through stage 4 chronic kidney disease, or unspecified chronic kidney disease: Secondary | ICD-10-CM | POA: Diagnosis not present

## 2022-11-19 DIAGNOSIS — E875 Hyperkalemia: Secondary | ICD-10-CM | POA: Diagnosis not present

## 2022-11-19 DIAGNOSIS — N1832 Chronic kidney disease, stage 3b: Secondary | ICD-10-CM | POA: Diagnosis not present

## 2022-11-20 ENCOUNTER — Other Ambulatory Visit (HOSPITAL_COMMUNITY): Payer: Self-pay | Admitting: Nephrology

## 2022-11-20 DIAGNOSIS — R809 Proteinuria, unspecified: Secondary | ICD-10-CM

## 2022-11-20 DIAGNOSIS — N1832 Chronic kidney disease, stage 3b: Secondary | ICD-10-CM

## 2022-11-20 DIAGNOSIS — R8290 Unspecified abnormal findings in urine: Secondary | ICD-10-CM

## 2022-11-22 ENCOUNTER — Other Ambulatory Visit (HOSPITAL_COMMUNITY): Payer: Self-pay

## 2022-11-22 ENCOUNTER — Other Ambulatory Visit: Payer: Self-pay

## 2022-11-22 MED ORDER — ALPRAZOLAM 1 MG PO TABS
1.0000 mg | ORAL_TABLET | Freq: Three times a day (TID) | ORAL | 0 refills | Status: DC
  Filled 2022-11-22: qty 270, 90d supply, fill #0

## 2022-11-25 ENCOUNTER — Ambulatory Visit (HOSPITAL_COMMUNITY)
Admission: RE | Admit: 2022-11-25 | Discharge: 2022-11-25 | Disposition: A | Discharge status: Home / Self Care | Payer: Commercial Managed Care - PPO | Source: Ambulatory Visit | Attending: Nephrology | Admitting: Nephrology

## 2022-11-25 DIAGNOSIS — N1832 Chronic kidney disease, stage 3b: Secondary | ICD-10-CM | POA: Diagnosis not present

## 2022-11-25 DIAGNOSIS — R8290 Unspecified abnormal findings in urine: Secondary | ICD-10-CM | POA: Diagnosis not present

## 2022-11-25 DIAGNOSIS — N189 Chronic kidney disease, unspecified: Secondary | ICD-10-CM | POA: Diagnosis not present

## 2022-11-25 DIAGNOSIS — R809 Proteinuria, unspecified: Secondary | ICD-10-CM | POA: Diagnosis not present

## 2022-11-26 DIAGNOSIS — N2889 Other specified disorders of kidney and ureter: Secondary | ICD-10-CM | POA: Insufficient documentation

## 2022-12-09 ENCOUNTER — Other Ambulatory Visit: Payer: Self-pay

## 2022-12-09 ENCOUNTER — Other Ambulatory Visit (HOSPITAL_COMMUNITY): Payer: Self-pay

## 2022-12-09 DIAGNOSIS — N1832 Chronic kidney disease, stage 3b: Secondary | ICD-10-CM | POA: Diagnosis not present

## 2022-12-09 DIAGNOSIS — N2889 Other specified disorders of kidney and ureter: Secondary | ICD-10-CM | POA: Diagnosis not present

## 2022-12-09 DIAGNOSIS — E785 Hyperlipidemia, unspecified: Secondary | ICD-10-CM | POA: Diagnosis not present

## 2022-12-09 DIAGNOSIS — I129 Hypertensive chronic kidney disease with stage 1 through stage 4 chronic kidney disease, or unspecified chronic kidney disease: Secondary | ICD-10-CM | POA: Diagnosis not present

## 2022-12-09 DIAGNOSIS — R809 Proteinuria, unspecified: Secondary | ICD-10-CM | POA: Diagnosis not present

## 2022-12-09 DIAGNOSIS — E1122 Type 2 diabetes mellitus with diabetic chronic kidney disease: Secondary | ICD-10-CM | POA: Diagnosis not present

## 2022-12-09 DIAGNOSIS — E875 Hyperkalemia: Secondary | ICD-10-CM | POA: Diagnosis not present

## 2022-12-09 MED ORDER — OLMESARTAN MEDOXOMIL 20 MG PO TABS
20.0000 mg | ORAL_TABLET | Freq: Every day | ORAL | 11 refills | Status: DC
  Filled 2022-12-09: qty 90, 90d supply, fill #0
  Filled 2023-03-03: qty 90, 90d supply, fill #1
  Filled 2023-06-01: qty 90, 90d supply, fill #2
  Filled 2023-11-17: qty 90, 90d supply, fill #3

## 2022-12-11 ENCOUNTER — Other Ambulatory Visit (HOSPITAL_COMMUNITY): Payer: Self-pay

## 2022-12-11 MED ORDER — FREESTYLE LIBRE 3 PLUS SENSOR MISC
1 refills | Status: DC
  Filled 2022-12-11: qty 2, 30d supply, fill #0
  Filled 2023-01-22 (×2): qty 2, 30d supply, fill #1

## 2022-12-12 ENCOUNTER — Other Ambulatory Visit: Payer: Self-pay

## 2022-12-12 ENCOUNTER — Other Ambulatory Visit (HOSPITAL_COMMUNITY): Payer: Self-pay

## 2022-12-12 ENCOUNTER — Encounter: Payer: Self-pay | Admitting: Pharmacist

## 2022-12-26 ENCOUNTER — Encounter (HOSPITAL_COMMUNITY): Payer: Self-pay

## 2022-12-26 ENCOUNTER — Emergency Department (HOSPITAL_COMMUNITY): Payer: Commercial Managed Care - PPO

## 2022-12-26 ENCOUNTER — Emergency Department (HOSPITAL_COMMUNITY)
Admission: EM | Admit: 2022-12-26 | Discharge: 2022-12-26 | Disposition: A | Discharge status: Home / Self Care | Payer: Commercial Managed Care - PPO | Attending: Emergency Medicine | Admitting: Emergency Medicine

## 2022-12-26 ENCOUNTER — Other Ambulatory Visit: Payer: Self-pay

## 2022-12-26 DIAGNOSIS — N2889 Other specified disorders of kidney and ureter: Secondary | ICD-10-CM | POA: Diagnosis not present

## 2022-12-26 DIAGNOSIS — K8500 Idiopathic acute pancreatitis without necrosis or infection: Secondary | ICD-10-CM | POA: Insufficient documentation

## 2022-12-26 DIAGNOSIS — K5730 Diverticulosis of large intestine without perforation or abscess without bleeding: Secondary | ICD-10-CM | POA: Diagnosis not present

## 2022-12-26 DIAGNOSIS — R109 Unspecified abdominal pain: Secondary | ICD-10-CM | POA: Diagnosis present

## 2022-12-26 DIAGNOSIS — K8591 Acute pancreatitis with uninfected necrosis, unspecified: Secondary | ICD-10-CM | POA: Diagnosis not present

## 2022-12-26 LAB — URINALYSIS, ROUTINE W REFLEX MICROSCOPIC
Bacteria, UA: NONE SEEN
Bilirubin Urine: NEGATIVE
Glucose, UA: >=500 mg/dL — AB
Hgb urine dipstick: NEGATIVE
Ketones, ur: 5 mg/dL — AB
Leukocytes,Ua: NEGATIVE
Nitrite: NEGATIVE
Protein, ur: NEGATIVE mg/dL
Specific Gravity, Urine: 1.016 (ref 1.005–1.030)
pH: 5 (ref 5.0–8.0)

## 2022-12-26 LAB — CBC
HCT: 35.3 % — ABNORMAL LOW (ref 39.0–52.0)
Hemoglobin: 11.2 g/dL — ABNORMAL LOW (ref 13.0–17.0)
MCH: 30.7 pg (ref 26.0–34.0)
MCHC: 31.7 g/dL (ref 30.0–36.0)
MCV: 96.7 fL (ref 80.0–100.0)
Platelets: 427 10*3/uL — ABNORMAL HIGH (ref 150–400)
RBC: 3.65 MIL/uL — ABNORMAL LOW (ref 4.22–5.81)
RDW: 15.7 % — ABNORMAL HIGH (ref 11.5–15.5)
WBC: 10.4 10*3/uL (ref 4.0–10.5)
nRBC: 0 % (ref 0.0–0.2)

## 2022-12-26 LAB — COMPREHENSIVE METABOLIC PANEL
ALT: 74 U/L — ABNORMAL HIGH (ref 0–44)
AST: 56 U/L — ABNORMAL HIGH (ref 15–41)
Albumin: 4 g/dL (ref 3.5–5.0)
Alkaline Phosphatase: 45 U/L (ref 38–126)
Anion gap: 14 (ref 5–15)
BUN: 25 mg/dL — ABNORMAL HIGH (ref 8–23)
CO2: 20 mmol/L — ABNORMAL LOW (ref 22–32)
Calcium: 9.7 mg/dL (ref 8.9–10.3)
Chloride: 102 mmol/L (ref 98–111)
Creatinine, Ser: 1.41 mg/dL — ABNORMAL HIGH (ref 0.61–1.24)
GFR, Estimated: 56 mL/min — ABNORMAL LOW (ref >60)
Glucose, Bld: 92 mg/dL (ref 70–99)
Potassium: 4.2 mmol/L (ref 3.5–5.1)
Sodium: 136 mmol/L (ref 135–145)
Total Bilirubin: 0.8 mg/dL (ref <1.2)
Total Protein: 6.8 g/dL (ref 6.5–8.1)

## 2022-12-26 LAB — CBG MONITORING, ED: Glucose-Capillary: 80 mg/dL (ref 70–99)

## 2022-12-26 LAB — LIPASE, BLOOD: Lipase: 107 U/L — ABNORMAL HIGH (ref 11–51)

## 2022-12-26 MED ORDER — OXYCODONE-ACETAMINOPHEN 5-325 MG PO TABS: 1.0000 | ORAL_TABLET | Freq: Four times a day (QID) | ORAL | 0 refills | Status: DC | PRN

## 2022-12-26 MED ORDER — IOHEXOL 300 MG/ML  SOLN
75.0000 mL | Freq: Once | INTRAMUSCULAR | Status: AC | PRN
  Administered 2022-12-26: 75 mL via INTRAVENOUS

## 2022-12-26 MED ORDER — ONDANSETRON 4 MG PO TBDP: ORAL_TABLET | ORAL | 0 refills | Status: DC

## 2022-12-26 MED ORDER — HYDROCODONE-ACETAMINOPHEN 5-325 MG PO TABS
1.0000 | ORAL_TABLET | Freq: Once | ORAL | Status: AC
  Administered 2022-12-26: 1 via ORAL
  Filled 2022-12-26: qty 1

## 2022-12-26 MED ORDER — HYDROMORPHONE HCL 1 MG/ML IJ SOLN
1.0000 mg | Freq: Once | INTRAMUSCULAR | Status: AC
  Administered 2022-12-26: 1 mg via INTRAVENOUS
  Filled 2022-12-26: qty 1

## 2022-12-26 NOTE — ED Notes (Signed)
BGL 80 Called CT Pt removed dexcom

## 2022-12-26 NOTE — ED Notes (Signed)
Pt returned from CT informed BGL 60 And dexcom had to be removed   Gave soda and crackers to increase BGL

## 2022-12-26 NOTE — ED Provider Triage Note (Signed)
Emergency Medicine Provider Triage Evaluation Note  Kyle Giles , a 62 y.o. male  was evaluated in triage.  Pt complains of epigastric abdominal pain. Hx of chronic pancreatitis.  Review of Systems  Positive: Abdominal pain Negative: Fever, n/v  Physical Exam  BP (!) 141/66 (BP Location: Right Arm)   Pulse 95   Temp 98.7 F (37.1 C) (Oral)   Resp 20   Ht 5\' 2"  (1.575 m)   Wt 49.9 kg   SpO2 98%   BMI 20.12 kg/m  Gen:   Awake, no distress   Resp:  Normal effort  MSK:   Moves extremities without difficulty  Other:  Mild epigastric ttp  Medical Decision Making  Medically screening exam initiated at 12:59 PM.  Appropriate orders placed.  Kyle Giles was informed that the remainder of the evaluation will be completed by another provider, this initial triage assessment does not replace that evaluation, and the importance of remaining in the ED until their evaluation is complete.   Kyle Baton, MD 12/26/22 1300

## 2022-12-26 NOTE — Discharge Instructions (Signed)
Take liquids only for the next few days.  And follow-up with your GI doctor as planned Tuesday return if pain gets worse or vomiting prevents you from taking your medicines

## 2022-12-26 NOTE — ED Triage Notes (Signed)
Chronic pancreatitis LUQ abd pain that radiates to the RUQ on and off since July  Worse when laying down at night  Dull pain during the day  Denies n/v/d

## 2022-12-26 NOTE — ED Provider Notes (Signed)
[facility] Provider Note   CSN: [id number] Arrival date & time: 12/26/22  1203     History  Chief Complaint  Patient presents with   Abdominal Pain    Kyle Giles is a 63 y.o. male.  Patient with history of abdominal pain and pancreatitis.  His pain has gotten worse recently  The history is provided by the patient and medical records.  Abdominal Pain Pain location:  Epigastric Pain quality: aching   Pain radiates to:  Does not radiate Pain severity:  Moderate Onset quality:  Sudden Timing:  Constant Progression:  Worsening Chronicity:  Recurrent Context: alcohol use   Relieved by:  Nothing Worsened by:  Nothing Associated symptoms: no chest pain, no cough, no diarrhea, no fatigue and no hematuria        Home Medications Prior to Admission medications   Medication Sig Start Date End Date Taking? Authorizing Provider  ondansetron (ZOFRAN-ODT) 4 MG disintegrating tablet 4mg  ODT q4 hours prn nausea/vomit 12/26/22  Yes Bethann Berkshire, MD  oxyCODONE-acetaminophen (PERCOCET/ROXICET) 5-325 MG tablet Take 1 tablet by mouth every 6 (six) hours as needed for severe pain (pain score 7-10). 12/26/22  Yes Bethann Berkshire, MD  oxyCODONE-acetaminophen (PERCOCET/ROXICET) 5-325 MG tablet Take 1 tablet by mouth every 6 (six) hours as needed for severe pain (pain score 7-10). 12/26/22  Yes Bethann Berkshire, MD  ALPRAZolam Prudy Feeler) 1 MG tablet Take 1 tablet (1 mg total) by mouth 3 (three) times daily. 11/22/22     atorvastatin (LIPITOR) 10 MG tablet Take 1 tablet (10 mg total) by mouth daily. 01/10/22     Blood Glucose Monitoring Suppl (FREESTYLE LITE) w/Device KIT Use to test blood sugar daily 05/09/22     cetirizine (ZYRTEC) 10 MG tablet Take 1 tablet (10 mg total) by mouth daily. Patient not taking: Reported on 06/06/2022 01/10/22     Cholecalciferol (VITAMIN D) 50 MCG (2000 UT) tablet Take 2,000 Units by mouth 2 (two) times daily.    [provider]  Continuous Glucose Sensor (FREESTYLE LIBRE 3 PLUS SENSOR) MISC Checking sugar daily 12/11/22     diltiazem (CARDIZEM CD) 360 MG 24 hr capsule Take 1 capsule (360 mg total) by mouth every evening. 07/19/22     docusate sodium (COLACE) 100 MG capsule Take 1 capsule (100 mg total) by mouth 2 (two) times daily. Patient not taking: Reported on 06/06/2022 05/13/22 05/13/23  Pappayliou, Santina Evans A, DO  empagliflozin (JARDIANCE) 10 MG TABS tablet Take 1 tablet (10 mg total) by mouth every morning. 11/18/22     fenofibrate 160 MG tablet Take 1 tablet (160 mg total) by mouth daily. 11/11/22     glucose blood test strip Use to test blood sugar daily 05/09/22     Lancets (FREESTYLE) lancets Use to test blood sugar daily 05/09/22     metFORMIN (GLUCOPHAGE) 500 MG tablet Take 0.5 tablets (250 mg total) by mouth 2 (two) times daily. 09/13/22     olmesartan (BENICAR) 20 MG tablet Take 1 tablet (20 mg total) by mouth daily. 12/09/22     olmesartan-hydrochlorothiazide (BENICAR HCT) 40-25 MG tablet Take 1 tablet by mouth daily. 08/12/22     Propylene Glycol (SYSTANE COMPLETE) 0.6 % SOLN Place 1 drop into both eyes daily as needed (dry eyes).    [provider]      Allergies    Patient has no known allergies.    Review of Systems   Review of Systems  Constitutional:  Negative for appetite change and fatigue.  HENT:  Negative for congestion, ear discharge and sinus pressure.   Eyes:  Negative for discharge.  Respiratory:  Negative for cough.   Cardiovascular:  Negative for chest pain.  Gastrointestinal:  Positive for abdominal pain. Negative for diarrhea.  Genitourinary:  Negative for frequency and hematuria.  Musculoskeletal:  Negative for back pain.  Skin:  Negative for rash.  Neurological:  Negative for seizures and headaches.  Psychiatric/Behavioral:  Negative for hallucinations.     Physical Exam Updated Vital Signs BP (!) 130/59   Pulse 62   Temp 98.7 F (37.1 C) (Oral)   Resp 20    Ht 5\' 2"  (1.575 m)   Wt 49.9 kg   SpO2 94%   BMI 20.12 kg/m  Physical Exam Vitals and nursing note reviewed.  Constitutional:      Appearance: He is well-developed.  HENT:     Head: Normocephalic.     Nose: Nose normal.  Eyes:     General: No scleral icterus.    Conjunctiva/sclera: Conjunctivae normal.  Neck:     Thyroid: No thyromegaly.  Cardiovascular:     Rate and Rhythm: Normal rate and regular rhythm.     Heart sounds: No murmur heard.    No friction rub. No gallop.  Pulmonary:     Breath sounds: No stridor. No wheezing or rales.  Chest:     Chest wall: No tenderness.  Abdominal:     General: There is no distension.     Tenderness: There is abdominal tenderness. There is no rebound.  Musculoskeletal:        General: Normal range of motion.     Cervical back: Neck supple.  Lymphadenopathy:     Cervical: No cervical adenopathy.  Skin:    Findings: No erythema or rash.  Neurological:     Mental Status: He is alert and oriented to person, place, and time.     Motor: No abnormal muscle tone.     Coordination: Coordination normal.  Psychiatric:        Behavior: Behavior normal.     ED Results / Procedures / Treatments   Labs (all labs ordered are listed, but only abnormal results are displayed) Labs Reviewed  LIPASE, BLOOD - Abnormal; Notable for the following components:      Result Value   Lipase 107 (*)    All other components within normal limits  COMPREHENSIVE METABOLIC PANEL - Abnormal; Notable for the following components:   CO2 20 (*)    BUN 25 (*)    Creatinine, Ser 1.41 (*)    AST 56 (*)    ALT 74 (*)    GFR, Estimated 56 (*)    All other components within normal limits  CBC - Abnormal; Notable for the following components:   RBC 3.65 (*)    Hemoglobin 11.2 (*)    HCT 35.3 (*)    RDW 15.7 (*)    Platelets 427 (*)    All other components within normal limits  URINALYSIS, ROUTINE W REFLEX MICROSCOPIC - Abnormal; Notable for the following  components:   Glucose, UA >=500 (*)    Ketones, ur 5 (*)    All other components within normal limits  CBG MONITORING, ED    EKG None  Radiology CT ABDOMEN PELVIS W CONTRAST  Result Date: 12/26/2022 CLINICAL DATA:  Abdominal pain, concern for pancreatitis EXAM: CT ABDOMEN AND PELVIS WITH CONTRAST TECHNIQUE: Multidetector CT imaging of the abdomen and pelvis  was performed using the standard protocol following bolus administration of intravenous contrast. RADIATION DOSE REDUCTION: This exam was performed according to the departmental dose-optimization program which includes automated exposure control, adjustment of the mA and/or kV according to patient size and/or use of iterative reconstruction technique. CONTRAST:  75mL OMNIPAQUE IOHEXOL 300 MG/ML  SOLN COMPARISON:  05/31/2022 FINDINGS: Lower chest: No acute pleural or parenchymal lung disease. Hepatobiliary: Mild hepatic steatosis. No focal liver abnormality. The gallbladder is unremarkable. Pancreas: Coarse pancreatic parenchymal calcifications consistent with sequela of chronic calcific pancreatitis. Edematous changes are seen surrounding the head and body of the pancreas consistent with acute pancreatitis. 8 mm hypodensity within the inflamed pancreatic head reference image 22/2 compatible with presumed IPMN or small pseudocyst seen previously. Pancreatic divisum without evidence of acute pancreatic duct dilation. The cysto gastrostomy stent seen previously has been removed in the interim. Spleen: Normal in size without focal abnormality. Adrenals/Urinary Tract: Stable appearance of the kidneys and adrenals. Calcifications at the renal hila consistent with vascular calcifications. No urinary tract calculi or obstructive uropathy. The bladder is unremarkable. Stomach/Bowel: No bowel obstruction or ileus. Normal appendix right lower quadrant. Diffuse colonic diverticulosis without evidence of acute diverticulitis. No bowel wall thickening or  inflammatory change. Vascular/Lymphatic: Aortic atherosclerosis. Patent splenic vein, SMV, and portal vein. No pathologic adenopathy within the abdomen or pelvis. Reproductive: Prostate is unremarkable. Other: No free fluid or free intraperitoneal gas. No abdominal wall hernia. Musculoskeletal: No acute or destructive bony abnormalities. Reconstructed images demonstrate no additional findings. IMPRESSION: 1. Acute pancreatitis, with marked edema and peripancreatic fat stranding within the head and body of the pancreas. Chronic subcentimeter pseudocyst or IPMN within the inflamed pancreatic head again noted unchanged. No new organized fluid collection to suggest abscess or acute pseudocyst. 2. Diffuse colonic diverticulosis without diverticulitis. 3.  Aortic Atherosclerosis (ICD10-I70.0). Electronically Signed   By: Sharlet Salina M.D.   On: 12/26/2022 16:32    Procedures Procedures    Medications Ordered in ED Medications  HYDROcodone-acetaminophen (NORCO/VICODIN) 5-325 MG per tablet 1 tablet (1 tablet Oral Given 12/26/22 1309)  iohexol (OMNIPAQUE) 300 MG/ML solution 75 mL (75 mLs Intravenous Contrast Given 12/26/22 1456)  HYDROmorphone (DILAUDID) injection 1 mg (1 mg Intravenous Given 12/26/22 1649)    ED Course/ Medical Decision Making/ A&P                                 Medical Decision Making Amount and/or Complexity of Data Reviewed Labs: ordered.  Risk Prescription drug management.  This patient presents to the ED for concern of abdominal pain, this involves an extensive number of treatment options, and is a complaint that carries with it a high risk of complications and morbidity.  The differential diagnosis includes pancreatitis and appendicitis   Co morbidities that complicate the patient evaluation  Pancreatitis   Additional history obtained:  Additional history obtained from patient External records from outside source obtained and reviewed including hospital  records   Lab Tests:  I Ordered, and personally interpreted labs.  The pertinent results include: Lipase 107   Imaging Studies ordered:  I ordered imaging studies including CT abdomen I independently visualized and interpreted imaging which showed cute pancreatitis I agree with the radiologist interpretation   Cardiac Monitoring: / EKG:  The patient was maintained on a cardiac monitor.  I personally viewed and interpreted the cardiac monitored which showed an underlying rhythm of: Normal sinus rhythm   Consultations Obtained:  No consultant  Problem List / ED Course / Critical interventions / Medication management  Pancreatitis I ordered medication including Dilaudid for pain Reevaluation of the patient after these medicines showed that the patient improved I have reviewed the patients home medicines and have made adjustments as needed   Social Determinants of Health:  None   Test / Admission - Considered:  None  Patient with acute on chronic pancreatitis.  Patient wants to be treated at home.  He is given Percocets and will follow-up with GI next week        Final Clinical Impression(s) / ED Diagnoses Final diagnoses:  Idiopathic acute pancreatitis without infection or necrosis    Rx / DC Orders ED Discharge Orders          Ordered    oxyCODONE-acetaminophen (PERCOCET/ROXICET) 5-325 MG tablet  Every 6 hours PRN        12/26/22 1856    ondansetron (ZOFRAN-ODT) 4 MG disintegrating tablet        12/26/22 1856    oxyCODONE-acetaminophen (PERCOCET/ROXICET) 5-325 MG tablet  Every 6 hours PRN        12/26/22 1856              Bethann Berkshire, MD 12/27/22 1223

## 2022-12-27 MED FILL — Oxycodone w/ Acetaminophen Tab 5-325 MG: ORAL | Qty: 6 | Status: AC

## 2022-12-31 ENCOUNTER — Encounter: Payer: Self-pay | Admitting: Gastroenterology

## 2022-12-31 ENCOUNTER — Encounter: Payer: Self-pay | Admitting: *Deleted

## 2022-12-31 ENCOUNTER — Other Ambulatory Visit: Payer: Self-pay | Admitting: *Deleted

## 2022-12-31 ENCOUNTER — Other Ambulatory Visit (HOSPITAL_COMMUNITY): Payer: Self-pay

## 2022-12-31 ENCOUNTER — Ambulatory Visit (INDEPENDENT_AMBULATORY_CARE_PROVIDER_SITE_OTHER): Payer: Commercial Managed Care - PPO | Admitting: Gastroenterology

## 2022-12-31 VITALS — BP 108/62 | HR 77 | Temp 97.8°F | Ht 62.0 in | Wt 102.4 lb

## 2022-12-31 DIAGNOSIS — K219 Gastro-esophageal reflux disease without esophagitis: Secondary | ICD-10-CM | POA: Diagnosis not present

## 2022-12-31 DIAGNOSIS — Q453 Other congenital malformations of pancreas and pancreatic duct: Secondary | ICD-10-CM

## 2022-12-31 DIAGNOSIS — K861 Other chronic pancreatitis: Secondary | ICD-10-CM

## 2022-12-31 DIAGNOSIS — K863 Pseudocyst of pancreas: Secondary | ICD-10-CM

## 2022-12-31 DIAGNOSIS — K859 Acute pancreatitis without necrosis or infection, unspecified: Secondary | ICD-10-CM

## 2022-12-31 DIAGNOSIS — K5900 Constipation, unspecified: Secondary | ICD-10-CM | POA: Diagnosis not present

## 2022-12-31 DIAGNOSIS — R1013 Epigastric pain: Secondary | ICD-10-CM

## 2022-12-31 DIAGNOSIS — R5383 Other fatigue: Secondary | ICD-10-CM

## 2022-12-31 MED ORDER — DICYCLOMINE HCL 10 MG PO CAPS: 10.0000 mg | ORAL_CAPSULE | Freq: Every day | ORAL | 1 refills | Status: AC | PRN

## 2022-12-31 NOTE — Progress Notes (Signed)
GI Office Note    Referring Provider: Assunta Found, MD Primary Care Physician:  Assunta Found, MD Primary Gastroenterologist: Hennie Duos. [name], DO  Date:  12/31/2022  ID:  Kyle Giles, DOB 27-May-1960, MRN [id number]   Chief Complaint   Chief Complaint  Patient presents with   Follow-up    Follow up. Still having some pain    History of Present Illness  Kyle Giles is a 62 y.o. male with a history of chronic pancreatitis c/b pseudocyst, hepatic steatosis, anxiety, HTN, and ?diabetes presenting today for follow up on ED visit for acute pancreatitis.   Colonoscopy in 2021 by Dr. Lovell Sheehan which showed 1 hyperplastic polyp.   Hospital admission in March 2023 for acute pancreatitis.  Felt to be alcohol induced as patient was simply drinking with 6 beers as well as liquor daily.  CT imaging showed fatty liver and with mention of duodenitis likely secondary to his pancreatitis.  Denied NSAID use.Marland Kitchen  Has some abnormal LFTs during hospitalization.  Viral hepatitis panel negative.  Improved shortly with conservative measures and was discharged home.  Recovered well but then started having recurrent epigastric pain which prompted an ER visit in April 2023 which she was discharged home.  He had began taking Nexium daily.   EGD July 2023: -Normal esophagus, stomach, and duodenum -Continue Nexium 20 mg daily   Hospitalized 01/02/2022 - 01/05/22 for acute on chronic pancreatitis. Presenting with 4 days of abdominal pain and advised to proceed to the hospital with recommendation of his PCP regarding high lipase and AKI.  He denied any recent medication changes.  Prior workup with normal triglycerides and calcium.  At this time he also had acute kidney injury.  Creatinine 3.6 on admission.  Likely due to dehydration.  CT imaging as outlined below.  Advised to start taking pancreatic enzymes once able to tolerate solids (Creon 48,000 units with meals and 24,000 units with snacks).  Also recommended  complete alcohol cessation.  Outpatient follow-up in 4 weeks.  Dedicated pancreatic CT in 6-8 weeks.   CT A/P 01/02/2022: -5.7 cm cystic lesion in tail of pancreas likely pseudocyst -Peripancreatic fluid seen -Peripancreatic fluid and stranding extending from the pseudocyst to the lesser curvature of the stomach -No free air to suggest perforation -Mild prominence of main pancreatic duct increased from prior now measuring approximately 5 mm -Coarse calcifications in the pancreatic head -Hepatobiliary system within normal limits -Indistinctness of the wall of the lesser curvature of the stomach which appears, confluent with peripancreatic stranding. -Extensive colonic diverticulosis without diverticulitis   Patient seen in the office 02/13/2022 and 03/05/2022.  At the beginning of January his GERD was fairly well-controlled.  Was having significant major fatigue and falling asleep during the day.  Recently resigned from his job.  Had a good appetite and no weight loss.  Also not experiencing any diarrhea, primarily having constipation using MiraLAX.  Denied any melena or BRBPR.  Had occasional abdominal pain but also having a lot more gas.  Occasional NSAID use.  Denied any pica.  Taking vitamin D.  Advised to check CBC, iron panel, B12, folate, and vitamin A, D, E and K.  He was scheduled for CT pancreatic protocol.  Refilled Nexium.  Advised to continue MiraLAX as needed.  At the end of January patient reported having tenderness vomiting a CT scan then had more extensive severe pain that night at dinner.  Reducing to a blander diet was helping.  Had a couple days without pain but  then pain returned intermittently.  He denies any nausea or vomiting and has been drinking more water.  Also complaining of pain to his left groin area stating a possible hernia that had grown in size.  On exam he did have a golf ball sized soft tissue mass that was nontender.  He was referred to general surgery to discuss  hernia repair.  He was referred to Dr. Meridee Score for EUS and possible cyst gastrostomy.  As stated below MRI/MRCP ordered per his recommendation.   CT A/P with and without contrast 02/26/22: -Interval enlargement of unilocular cystic lesion along the body of the pancreas most consistent with pancreatic pseudocyst -No acute pancreatic inflammation -Ductus divisum variant ductal anatomy with calcification at the orifice of the accessory duct.  No significant ductal dilation currently.   Per Dr. Meridee Score fluid collection appears to be large enough to drain which can be done via endoscopic ultrasound  given his pain returning this would likely be best. He has some pancreatic anatomy which can put him at risk for recurrent pancreatitis (this is called pancreatic divisum) and further evaluation can be performed with an MRI/MRCP that would allow a better look at his anatomy for Dr. Meridee Score to discuss possible treatment options.    MRI/MRCP 03/27/2022: -Decrease size of 6.2 cm pseudocyst adjacent to pancreatic tail -Findings of chronic pancreatitis and pancreas divisum -No evidence of acute pancreatic inflammation. -Advised to keep follow-up with EUS with Dr. Meridee Score already planned for the end of March   OV 04/10/22.  Continued intermittent epigastric pain as well as left lower quadrant/inguinal pain.  Has a good appetite.  Symptoms better controlled without overeating.  Still with some weight loss and occasional constipation but no diarrhea.  Continue on Nexium once daily with some intermittent reflux symptoms.  No alcohol use or NSAIDs.  Advised to keep follow-up with surgery for inguinal hernia repair.  Follow low-fat diet.  Continue PPI once daily.  Focus on protein intake for nutrition.  Monitor weight.   EGD/EUS 05/02/22 EGD impression: - No gross lesions in the entire esophagus. Z- line regular, 40 cm from the incisors. - Erythematous mucosa in the gastric body. No other gross lesions in the  entire stomach. Biopsied. - No gross lesions in the duodenal bulb. - Duodenal deformity in the duodenal sweep ( as noted above query healed groove pancreatitis versus healed ulcer) - Normal major papilla. - Congested duodenal mucosa in the region of the minor papilla ( an area suggestive of a pancreatic duct was noted but the amount of swelling in this region makes finding of the true minor papilla difficult)   EUS impression: - A cystic lesion was seen in the pancreatic body and pancreatic tail. Tissue has not been obtained. However, the endosonographic appearance is consistent with a pancreatic pseudocyst. Cystgastrostomy created ( 15 mm AXIOS with 7 [place] by 5 cm double- pigtail through it) . - The pancreatic duct had a dilated endosonographic appearance in the pancreatic head and genu of the pancreas. The pancreatic duct measured up to 4 mm in diameter. - Findings suggestive of pancreatic stones were identified in the pancreatic head and accessory pancreatic duct. It was difficult to evaluate the rest of the pancreatic duct as a result of the pseudocyst. - Pancreatic parenchymal abnormalities consisting of lobularity and hyperechoic strands were noted in the pancreatic head and genu of the pancreas. It was difficult to evaluate the rest of the pancreas parenchyma as a result of the pseudocyst. - Hyperechoic material consistent  with sludge was visualized endosonographically in the gallbladder. - No malignant- appearing lymph nodes were visualized in the celiac region ( level 20) , peripancreatic region and porta hepatis region.   Left inguinal hernia repair on 05/13/22.    CT abdomen with contrast 05/31/22: IMPRESSION: 1. Sequela of prior pancreatitis with interval placement of cystogastrostomy stent with resolution of the previously noted pseudocyst in the pancreatic body/tail. 2. Peripheral subcentimeter hypodensity measuring 8 mm in the pancreatic head is new from 03/27/2022 and may represent a side  branch intraductal papillary mucinous neoplasms (IPMN) or small pseudocyst. Consider contrast-enhanced MRI/MRCP in 1 year.   Repeat EGD 06/10/22 with Dr. Meridee Score: -salmon colored mucosa to islands suspicious or barrett's s/p biopsy.  -non obstructing schatzki ring -2 cm hiatal hernia -Pre-existing Axios cystogastrostomy stent was noted and removed -Mild gastritis not rebiopsied -Normal duodenum -Recommended follow-up in the clinic to discuss several symptoms with repeat MRI/MRCP in 3-4 months.    Advised KUB to ensure passage of plastic cystogastrostomy stents.    KUB 06/12/22: -cystogastrostomy stents no longer seen -chronic calcific pancreatitis  Last office visit 07/03/2022.  Reportedly feeling pretty well compared to prior, his fatigue has improved as well.  He was back doing yard work but stamina was slowly increasing.  He had quit taking Nexium in March and denied any recent issues with reflux.  Does keep Mylanta at home and had only taken a handful of times.  Had some difficulty with healing from inguinal hernia but overall doing very well.  Denied any nausea, vomiting, or abdominal pain.  Had been using MiraLAX as needed for constipation.  Advised MRI/MRCP in August 2024 to reevaluate pancreatic abnormality and discuss treatment for pancreatic divisum.  Advised to continue alcohol cessation and low-fat diet.  ED visit 12/26/2022 for abdominal pain.  He was noted to have abdominal tenderness on exam.  Labs revealed a lipase of 107, AST 56, ALT 74, creatinine 1.41, BUN 25, hemoglobin 11.2, platelets 427, UA with glucose and ketones present.  Underwent CT scan as noted below.  He was treated with oxycodone and advised to clear liquid diet for several days and advised to follow-up with GI.  CT A/P with contrast: -Acute pancreatitis with marked edema and peripancreatic fat stranding with head and body of pancreas -Chronic subcentimeter pseudocyst or IPMN within the inflamed pancreatic head  again noted and unchanged -No new organized fluid collection to suggest abscess or acute pseudocyst -Diffuse colonic diverticulosis without diverticulitis   Today:  Patient reports he has been having intermittent abdominal pain as well as associated back pain for about 3 months.  The back pain more so has affected his quality of life.  At that time he was still able to eat a healthy diet without much issue.  He does report taking prednisone briefly for his back pain which increases blood sugar significantly and had follow-up labs with an A1c of 9.7.  He is due to see endocrine in December.  He has had some softer stools since his pancreatitis but he was previously on MiraLAX as needed for constipation but noted some stomach discomfort with this therefore he switched to Senokot.  He has cut back on this somewhat since he is having some looser stools.  Continues to feel tired and trying not to overdo anything.  He admittedly was on Advil 2 pills nightly during all of his back pain but denies any significant nausea, vomiting.  Also no melena.  He stated Tylenol was not helping him previously  and admitted to taking some leftover tramadol for his back pain in the past.  For the last several days he has been doing a slow introduction of solid foods into his diet with mostly boiled eggs and toast.  He did follow a clear liquid diet for about 3 days and with that he has noticed some improvement in his abdominal pain.  Wt Readings from Last 3 Encounters:  12/31/22 102 lb 6.4 oz (46.4 kg)  12/26/22 110 lb (49.9 kg)  07/03/22 115 lb (52.2 kg)    Current Outpatient Medications  Medication Sig Dispense Refill   ALPRAZolam (XANAX) 1 MG tablet Take 1 tablet (1 mg total) by mouth 3 (three) times daily. 270 tablet 0   atorvastatin (LIPITOR) 10 MG tablet Take 1 tablet (10 mg total) by mouth daily. 90 tablet 3   Cholecalciferol (VITAMIN D) 50 MCG (2000 UT) tablet Take 2,000 Units by mouth 2 (two) times daily.      Continuous Glucose Sensor (FREESTYLE LIBRE 3 PLUS SENSOR) MISC Checking sugar daily 2 each 1   dicyclomine (BENTYL) 10 MG capsule Take 1 capsule (10 mg total) by mouth daily as needed for spasms. 30 capsule 1   diltiazem (CARDIZEM CD) 360 MG 24 hr capsule Take 1 capsule (360 mg total) by mouth every evening. 90 capsule 3   empagliflozin (JARDIANCE) 10 MG TABS tablet Take 1 tablet (10 mg total) by mouth every morning. 30 tablet 11   fenofibrate 160 MG tablet Take 1 tablet (160 mg total) by mouth daily. 90 tablet 2   metFORMIN (GLUCOPHAGE) 500 MG tablet Take 0.5 tablets (250 mg total) by mouth 2 (two) times daily. 90 tablet 1   olmesartan (BENICAR) 20 MG tablet Take 1 tablet (20 mg total) by mouth daily. 30 tablet 11   Propylene Glycol (SYSTANE COMPLETE) 0.6 % SOLN Place 1 drop into both eyes daily as needed (dry eyes).     docusate sodium (COLACE) 100 MG capsule Take 1 capsule (100 mg total) by mouth 2 (two) times daily. (Patient not taking: Reported on 06/06/2022) 60 capsule 2   ondansetron (ZOFRAN-ODT) 4 MG disintegrating tablet 4mg  ODT q4 hours prn nausea/vomit (Patient not taking: Reported on 12/31/2022) 10 tablet 0   oxyCODONE-acetaminophen (PERCOCET/ROXICET) 5-325 MG tablet Take 1 tablet by mouth every 6 (six) hours as needed for severe pain (pain score 7-10). (Patient not taking: Reported on 12/31/2022) 20 tablet 0   No current facility-administered medications for this visit.    Past Medical History:  Diagnosis Date   Anxiety    DM type 2 (diabetes mellitus, type 2) (HCC)    GERD (gastroesophageal reflux disease)    Heart murmur    saw dr Margreta Journey 06-29-2019 echo done    Hypercholesterolemia    Hypertension    Palpitations    none in a long time as of 10-19-2019   Pancreatitis    S/P colonoscopy 09/2009   Dr. Lovell Sheehan: Moderate diverticulosis throughout colon, otherwise normal    Umbilical hernia     Past Surgical History:  Procedure Laterality Date   BALLOON DILATION N/A  05/02/2022   Procedure: BALLOON DILATION;  Surgeon: Lemar Lofty., MD;  Location: [facility];  Service: Gastroenterology;  Laterality: N/A;   BIOPSY  05/02/2022   Procedure: BIOPSY;  Surgeon: Meridee Score Netty Starring., MD;  Location: [facility];  Service: Gastroenterology;;   BIOPSY  06/10/2022   Procedure: BIOPSY;  Surgeon: Lemar Lofty., MD;  Location: [facility];  Service: Gastroenterology;;   COLONOSCOPY  N/A 12/21/2019   Procedure: COLONOSCOPY;  Surgeon: Franky Macho, MD;  Location: [facility];  Service: Gastroenterology;  Laterality: N/A;   cyst removed from finger  age 39   ESOPHAGOGASTRODUODENOSCOPY (EGD) WITH PROPOFOL N/A 08/13/2021   Procedure: ESOPHAGOGASTRODUODENOSCOPY (EGD) WITH PROPOFOL;  Surgeon: Lanelle Bal, DO;  Location: [facility];  Service: Endoscopy;  Laterality: N/A;  1:30pm   ESOPHAGOGASTRODUODENOSCOPY (EGD) WITH PROPOFOL N/A 05/02/2022   Procedure: ESOPHAGOGASTRODUODENOSCOPY (EGD) WITH PROPOFOL;  Surgeon: Meridee Score Netty Starring., MD;  Location: [facility];  Service: Gastroenterology;  Laterality: N/A;   ESOPHAGOGASTRODUODENOSCOPY (EGD) WITH PROPOFOL N/A 06/10/2022   Procedure: ESOPHAGOGASTRODUODENOSCOPY (EGD) WITH PROPOFOL;  Surgeon: Meridee Score Netty Starring., MD;  Location: [facility];  Service: Gastroenterology;  Laterality: N/A;   EUS N/A 05/02/2022   Procedure: UPPER ENDOSCOPIC ULTRASOUND (EUS) RADIAL;  Surgeon: Lemar Lofty., MD;  Location: [facility];  Service: Gastroenterology;  Laterality: N/A;   HERNIA REPAIR     as baby   PANCREATIC STENT PLACEMENT  05/02/2022   Procedure: CYSTGASTROSTOMY;  Surgeon: Mansouraty, Netty Starring., MD;  Location: [facility];  Service: Gastroenterology;;   POLYPECTOMY  12/21/2019   Procedure: POLYPECTOMY;  Surgeon: Franky Macho, MD;  Location: [facility];  Service: Gastroenterology;;   pyloric stenosis repair     as baby, operated at [facility] REMOVAL  06/10/2022   Procedure: STENT  REMOVAL;  Surgeon: Lemar Lofty., MD;  Location: [facility];  Service: Gastroenterology;;   UMBILICAL HERNIA REPAIR N/A 10/29/2019   Procedure: OPEN UMBILICAL HERNIA REPAIR WITH MESH;  Surgeon: Berna Bue, MD;  Location: [facility] ;  Service: General;  Laterality: N/A;   XI ROBOTIC ASSISTED INGUINAL HERNIA REPAIR WITH MESH Left 05/13/2022   Procedure: XI ROBOTIC ASSISTED INGUINAL HERNIA REPAIR WITH MESH;  Surgeon: Lewie Chamber, DO;  Location: [facility];  Service: General;  Laterality: Left;    Family History  Problem Relation Age of Onset   Colon cancer Neg Hx     Allergies as of 12/31/2022   (No Known Allergies)    Social History   Socioeconomic History   Marital status: Married    Spouse name: Not on file   Number of children: 2   Years of education: Not on file   Highest education level: Not on file  Occupational History   Occupation: Higher education careers adviser: [employer]    Comment: [employer]  Tobacco Use   Smoking status: Every Day    Current packs/day: 1.50    Average packs/day: 1.5 packs/day for 30.0 years (45.0 ttl pk-yrs)    Types: Cigarettes    Passive exposure: Current   Smokeless tobacco: Never   Tobacco comments:    1 ppd since 3 weeks as of 10-19-2019  Vaping Use   Vaping status: Never Used  Substance and Sexual Activity   Alcohol use: Not Currently    Comment: quit 05/30/21   Drug use: Not Currently   Sexual activity: Not on file  Other Topics Concern   Not on file  Social History Narrative   Not on file   Social Determinants of Health   Financial Resource Strain: Not on file  Food Insecurity: No Food Insecurity (01/02/2022)   Hunger Vital Sign    Worried About Running Out of Food in the Last Year: Never true    Ran Out of Food in the Last Year: Never true  Transportation Needs: No Transportation Needs (01/02/2022)   PRAPARE -  Administrator, Civil Service (Medical): No    Lack  of Transportation (Non-Medical): No  Physical Activity: Not on file  Stress: Not on file  Social Connections: Not on file     Review of Systems   Gen: + fatigue. Denies fever, chills, anorexia. Denies weight loss.  CV: Denies chest pain, palpitations, syncope, peripheral edema, and claudication. Resp: Denies dyspnea at rest, cough, wheezing, coughing up blood, and pleurisy. GI: See HPI Derm: Denies rash, itching, dry skin Psych: + depressive mood. Denies anxiety, memory loss, confusion. No homicidal or suicidal ideation.  Heme: Denies bruising, bleeding, and enlarged lymph nodes.  Physical Exam   BP 108/62 (BP Location: Right Arm, Patient Position: Sitting, Cuff Size: Normal)   Pulse 77   Temp 97.8 F (36.6 C) (Temporal)   Ht 5\' 2"  (1.575 m)   Wt 102 lb 6.4 oz (46.4 kg)   BMI 18.73 kg/m   General:   Alert and oriented. No distress noted. Pleasant and cooperative. Thin.  Head:  Normocephalic and atraumatic. Eyes:  Conjuctiva clear without scleral icterus. Mild scleral redness to right eye.  Mouth:  Oral mucosa pink and moist. Good dentition. No lesions. Abdomen:  +BS, soft, non-distended. Very mild ttp to epigastrium. No rebound or guarding. No HSM or masses noted. Rectal: deferred Msk:  Symmetrical without gross deformities. Normal posture. Extremities:  Without edema. Neurologic:  Alert and  oriented x4 Psych:  Alert and cooperative. Normal mood and affect.  Assessment  Kyle Giles is a 62 y.o. male with a history of chronic pancreatitis c/b pseudocyst, hepatic steatosis, anxiety, HTN, and ?diabetes presenting today for follow up on ED visit for acute pancreatitis.   Pancreas divisum, recurrent pancreatitis: Multiple hospitalizations this year for pancreatitis. Suspected to be secondary to alcohol initially. Workup has revealed pancreas divisum. His course has complicated by pseudocyst which required EUS with placement of cystogastrostomy for drainage. In need of follow  up MRI imaging to better evaluate pancreatic anatomy to determine treatment options for pancreas divisum.   Recent hospitalization for acute pancreatitis noted on imaging without concerning abscess or pseudocyst. Slowly improving with supportive management. Dietary recommendations given today as well as bentyl to help with cramping. Will schedule MRI/MRCP in about 4 weeks to allow for pancreatic healing. We may need to consider pancreatic enzymes if flares continue.   GERD: Well controlled with as needed mylanta. Symptoms usually well controlled with diet. Recent diet with boiled eggs and toast likely exacerbating symptoms as well as pancreatic inflammation.   Constipation: Was having some mild constipation previously taking miralax as needed but switched to senna given felt like he was not tolerating miralax well due to fullness feeling.   PLAN   MRI pancreatic protocol in 4 weeks Slowly advance diet as tolerated, low-fat.  Pancreatitis eating plan given Ensure adequate hydration May need to consider pancreatic enzymes if evidence of chronic pancreatitis Dicyclomine given for once a day as needed for severe abdominal pain Follow-up in 3 months    Brooke Bonito, MSN, FNP-BC, AGACNP-BC [facility]

## 2022-12-31 NOTE — Addendum Note (Signed)
Addended by: Elinor Dodge on: 12/31/2022 12:41 PM   Modules accepted: Orders

## 2022-12-31 NOTE — Patient Instructions (Addendum)
I have attached a pancreatitis eating plan for you.  Essentially this includes information about low-fat diet and things that you should avoid and things that are okay to eat as you slowly increase your diet.  If at any point you are unable to get back to a point where you are eating solid food due to abdominal pain please let me know as we may need to consider a trial of pancreatic enzymes.  You can resume MiraLAX if you begin having any return of constipation.  You may continue to use Mylanta as needed for reflux.  Please do your best to stay hydrated, I recommend for you to drink at least 6-8 glasses of water daily.   I have sent in a medication called dicyclomine for you to take up to 1 time a day as needed for severe abdominal pain and cramping.  We will get you scheduled for an MRI of your pancreas in about 4 weeks, around Christmas time or just before to further evaluate the anatomy of your pancreas.  Also want you to follow-up with Dr. Servando Salina after first of the year to disucss options for definitive treatment of your pancreas divisum.   We will plan to follow-up in 3 months, sooner if needed.  I hope you have a great Thanksgiving and Christmas!  It was a pleasure to see you today. I want to create trusting relationships with patients. If you receive a survey regarding your visit,  I greatly appreciate you taking time to fill this out on paper or through your MyChart. I value your feedback.  Brooke Bonito, MSN, FNP-BC, AGACNP-BC [facility]

## 2023-01-07 DIAGNOSIS — K861 Other chronic pancreatitis: Secondary | ICD-10-CM | POA: Diagnosis not present

## 2023-01-07 DIAGNOSIS — R1013 Epigastric pain: Secondary | ICD-10-CM | POA: Diagnosis not present

## 2023-01-07 DIAGNOSIS — Q453 Other congenital malformations of pancreas and pancreatic duct: Secondary | ICD-10-CM | POA: Diagnosis not present

## 2023-01-08 LAB — COMPREHENSIVE METABOLIC PANEL
ALT: 43 [IU]/L (ref 0–44)
AST: 29 [IU]/L (ref 0–40)
Albumin: 4.3 g/dL (ref 3.9–4.9)
Alkaline Phosphatase: 62 [IU]/L (ref 44–121)
BUN/Creatinine Ratio: 17 (ref 10–24)
BUN: 18 mg/dL (ref 8–27)
Bilirubin Total: 0.2 mg/dL (ref 0.0–1.2)
CO2: 22 mmol/L (ref 20–29)
Calcium: 10.1 mg/dL (ref 8.6–10.2)
Chloride: 103 mmol/L (ref 96–106)
Creatinine, Ser: 1.04 mg/dL (ref 0.76–1.27)
Globulin, Total: 2.3 g/dL (ref 1.5–4.5)
Glucose: 126 mg/dL — ABNORMAL HIGH (ref 70–99)
Potassium: 4.3 mmol/L (ref 3.5–5.2)
Sodium: 140 mmol/L (ref 134–144)
Total Protein: 6.6 g/dL (ref 6.0–8.5)
eGFR: 80 mL/min/{1.73_m2} (ref >59)

## 2023-01-13 ENCOUNTER — Other Ambulatory Visit (HOSPITAL_COMMUNITY): Payer: Self-pay

## 2023-01-20 ENCOUNTER — Other Ambulatory Visit: Payer: Self-pay

## 2023-01-20 ENCOUNTER — Other Ambulatory Visit (HOSPITAL_COMMUNITY): Payer: Self-pay

## 2023-01-20 MED ORDER — ATORVASTATIN CALCIUM 10 MG PO TABS
10.0000 mg | ORAL_TABLET | Freq: Every day | ORAL | 3 refills | Status: DC
  Filled 2023-01-20: qty 90, 90d supply, fill #0
  Filled 2023-04-20: qty 90, 90d supply, fill #1
  Filled 2023-07-22: qty 90, 90d supply, fill #2
  Filled 2023-10-18: qty 90, 90d supply, fill #3

## 2023-01-22 ENCOUNTER — Other Ambulatory Visit (HOSPITAL_COMMUNITY): Payer: Self-pay

## 2023-01-22 ENCOUNTER — Other Ambulatory Visit: Payer: Self-pay

## 2023-01-30 ENCOUNTER — Ambulatory Visit: Payer: Commercial Managed Care - PPO | Admitting: Nurse Practitioner

## 2023-01-30 ENCOUNTER — Encounter: Payer: Self-pay | Admitting: Nurse Practitioner

## 2023-01-30 VITALS — BP 103/58 | HR 77 | Wt 99.2 lb

## 2023-01-30 DIAGNOSIS — Z7984 Long term (current) use of oral hypoglycemic drugs: Secondary | ICD-10-CM

## 2023-01-30 DIAGNOSIS — N182 Chronic kidney disease, stage 2 (mild): Secondary | ICD-10-CM

## 2023-01-30 DIAGNOSIS — E1122 Type 2 diabetes mellitus with diabetic chronic kidney disease: Secondary | ICD-10-CM

## 2023-01-30 DIAGNOSIS — E782 Mixed hyperlipidemia: Secondary | ICD-10-CM

## 2023-01-30 DIAGNOSIS — I10 Essential (primary) hypertension: Secondary | ICD-10-CM | POA: Diagnosis not present

## 2023-01-30 LAB — POCT GLYCOSYLATED HEMOGLOBIN (HGB A1C): Hemoglobin A1C: 6 % — AB (ref 4.0–5.6)

## 2023-01-30 NOTE — Progress Notes (Signed)
Endocrinology Consult Note       01/30/2023, 12:23 PM   Subjective:    Patient ID: Kyle Giles, male    DOB: 01/12/1961.  Kyle Giles is being seen in consultation for management of currently uncontrolled symptomatic diabetes requested by  Assunta Found, MD.   Past Medical History:  Diagnosis Date   Anxiety    DM type 2 (diabetes mellitus, type 2) (HCC)    GERD (gastroesophageal reflux disease)    Heart murmur    saw dr Margreta Journey 06-29-2019 echo done    Hypercholesterolemia    Hypertension    Palpitations    none in a long time as of 10-19-2019   Pancreatitis    S/P colonoscopy 09/2009   Dr. Lovell Sheehan: Moderate diverticulosis throughout colon, otherwise normal    Umbilical hernia     Past Surgical History:  Procedure Laterality Date   BALLOON DILATION N/A 05/02/2022   Procedure: BALLOON DILATION;  Surgeon: Lemar Lofty., MD;  Location: [facility];  Service: Gastroenterology;  Laterality: N/A;   BIOPSY  05/02/2022   Procedure: BIOPSY;  Surgeon: Meridee Score Netty Starring., MD;  Location: [facility];  Service: Gastroenterology;;   BIOPSY  06/10/2022   Procedure: BIOPSY;  Surgeon: Lemar Lofty., MD;  Location: [facility];  Service: Gastroenterology;;   COLONOSCOPY N/A 12/21/2019   Procedure: COLONOSCOPY;  Surgeon: Franky Macho, MD;  Location: [facility];  Service: Gastroenterology;  Laterality: N/A;   cyst removed from finger  age 71   ESOPHAGOGASTRODUODENOSCOPY (EGD) WITH PROPOFOL N/A 08/13/2021   Procedure: ESOPHAGOGASTRODUODENOSCOPY (EGD) WITH PROPOFOL;  Surgeon: Lanelle Bal, DO;  Location: [facility];  Service: Endoscopy;  Laterality: N/A;  1:30pm   ESOPHAGOGASTRODUODENOSCOPY (EGD) WITH PROPOFOL N/A 05/02/2022   Procedure: ESOPHAGOGASTRODUODENOSCOPY (EGD) WITH PROPOFOL;  Surgeon: Meridee Score Netty Starring., MD;  Location: [facility];  Service: Gastroenterology;   Laterality: N/A;   ESOPHAGOGASTRODUODENOSCOPY (EGD) WITH PROPOFOL N/A 06/10/2022   Procedure: ESOPHAGOGASTRODUODENOSCOPY (EGD) WITH PROPOFOL;  Surgeon: Meridee Score Netty Starring., MD;  Location: [facility];  Service: Gastroenterology;  Laterality: N/A;   EUS N/A 05/02/2022   Procedure: UPPER ENDOSCOPIC ULTRASOUND (EUS) RADIAL;  Surgeon: Lemar Lofty., MD;  Location: [facility];  Service: Gastroenterology;  Laterality: N/A;   HERNIA REPAIR     as baby   PANCREATIC STENT PLACEMENT  05/02/2022   Procedure: CYSTGASTROSTOMY;  Surgeon: Mansouraty, Netty Starring., MD;  Location: [facility];  Service: Gastroenterology;;   POLYPECTOMY  12/21/2019   Procedure: POLYPECTOMY;  Surgeon: Franky Macho, MD;  Location: [facility];  Service: Gastroenterology;;   pyloric stenosis repair     as baby, operated at [facility] REMOVAL  06/10/2022   Procedure: STENT REMOVAL;  Surgeon: Lemar Lofty., MD;  Location: [facility];  Service: Gastroenterology;;   UMBILICAL HERNIA REPAIR N/A 10/29/2019   Procedure: OPEN UMBILICAL HERNIA REPAIR WITH MESH;  Surgeon: Berna Bue, MD;  Location: [facility];  Service: General;  Laterality: N/A;   XI ROBOTIC ASSISTED INGUINAL HERNIA REPAIR WITH MESH Left 05/13/2022   Procedure: XI ROBOTIC ASSISTED INGUINAL HERNIA REPAIR WITH MESH;  Surgeon: Lewie Chamber, DO;  Location: [facility];  Service: General;  Laterality: Left;    Social History   Socioeconomic History   Marital status: Married    Spouse name: Not on file   Number of children: 2   Years of education: Not on file   Highest education level: Not on file  Occupational History   Occupation: Higher education careers adviser: [employer]    Comment: [employer]  Tobacco Use   Smoking status: Every Day    Current packs/day: 1.50    Average packs/day: 1.5 packs/day for 30.0 years (45.0 ttl pk-yrs)    Types: Cigarettes    Passive exposure: Current   Smokeless  tobacco: Never   Tobacco comments:    1 ppd since 3 weeks as of 10-19-2019  Vaping Use   Vaping status: Never Used  Substance and Sexual Activity   Alcohol use: Not Currently    Comment: quit 05/30/21   Drug use: Not Currently   Sexual activity: Not on file  Other Topics Concern   Not on file  Social History Narrative   Not on file   Social Drivers of Health   Financial Resource Strain: Not on file  Food Insecurity: No Food Insecurity (01/02/2022)   Hunger Vital Sign    Worried About Running Out of Food in the Last Year: Never true    Ran Out of Food in the Last Year: Never true  Transportation Needs: No Transportation Needs (01/02/2022)   PRAPARE - Administrator, Civil Service (Medical): No    Lack of Transportation (Non-Medical): No  Physical Activity: Not on file  Stress: Not on file  Social Connections: Not on file    Family History  Problem Relation Age of Onset   Colon cancer Neg Hx     Outpatient Encounter Medications as of 01/30/2023  Medication Sig   ALPRAZolam (XANAX) 1 MG tablet Take 1 tablet (1 mg total) by mouth 3 (three) times daily.   atorvastatin (LIPITOR) 10 MG tablet Take 1 tablet (10 mg total) by mouth daily.   Cholecalciferol (VITAMIN D) 50 MCG (2000 UT) tablet Take 2,000 Units by mouth 2 (two) times daily.   Continuous Glucose Sensor (FREESTYLE LIBRE 3 PLUS SENSOR) MISC Checking sugar daily   dicyclomine (BENTYL) 10 MG capsule Take 1 capsule (10 mg total) by mouth daily as needed for spasms.   diltiazem (CARDIZEM CD) 360 MG 24 hr capsule Take 1 capsule (360 mg total) by mouth every evening.   empagliflozin (JARDIANCE) 10 MG TABS tablet Take 1 tablet (10 mg total) by mouth every morning.   esomeprazole (NEXIUM) 20 MG capsule Take 20 mg by mouth daily at 12 noon.   fenofibrate 160 MG tablet Take 1 tablet (160 mg total) by mouth daily.   olmesartan (BENICAR) 20 MG tablet Take 1 tablet (20 mg total) by mouth daily.   Propylene Glycol (SYSTANE  COMPLETE) 0.6 % SOLN Place 1 drop into both eyes daily as needed (dry eyes).   traMADol (ULTRAM) 50 MG tablet Take 100 mg by mouth every 6 (six) hours.   [DISCONTINUED] metFORMIN (GLUCOPHAGE) 500 MG tablet Take 0.5 tablets (250 mg total) by mouth 2 (two) times daily.   [DISCONTINUED] docusate sodium (COLACE) 100 MG capsule Take 1 capsule (100 mg total) by mouth 2 (two) times daily. (Patient not taking: Reported on 06/06/2022)   [DISCONTINUED] ondansetron (ZOFRAN-ODT) 4 MG disintegrating tablet 4mg  ODT q4 hours prn nausea/vomit (Patient not taking: Reported on 12/31/2022)   [DISCONTINUED] oxyCODONE-acetaminophen (PERCOCET/ROXICET) 5-325 MG tablet Take  1 tablet by mouth every 6 (six) hours as needed for severe pain (pain score 7-10). (Patient not taking: Reported on 12/31/2022)   No facility-administered encounter medications on file as of 01/30/2023.    ALLERGIES: No Known Allergies  VACCINATION STATUS: Immunization History  Administered Date(s) Administered   Moderna Sars-Covid-2 Vaccination 04/10/2019, 05/12/2019    Diabetes He presents for his initial diabetic visit. He has type 2 diabetes mellitus. Onset time: diagnosed at approx age of 47. His disease course has been improving. There are no hypoglycemic associated symptoms. Associated symptoms include polyuria and weight loss. There are no hypoglycemic complications. Symptoms are stable. Diabetic complications include nephropathy. (Chronic pancreatitis) Risk factors for coronary artery disease include diabetes mellitus, male sex, hypertension, tobacco exposure, sedentary lifestyle and stress. Current diabetic treatment includes oral agent (dual therapy). He is compliant with treatment most of the time. He is following a low fat/cholesterol, high fiber and generally healthy diet. Meal planning includes avoidance of concentrated sweets. He has not had a previous visit with a dietitian. He rarely participates in exercise. His home blood glucose  trend is decreasing steadily. His overall blood glucose range is 90-110 mg/dl. (He presents today for his consultation with his CGM showing at goal glycemic profile overall.  His POCT A1c today is 6%, improving from last A1c.  He notes his A1c was as high as 9% a few months back but had been on/off prednisone at that time due to back pain.  He drinks mostly water, sips on diet ginger ale, rarely skim milk, and eats 4 small meals per day (due to chronic pancreatitis).  He does not exercise routinely due to back pain.  He is overdue for eye exam, has plans to get one early next year.  He reports he has seen podiatry but in the distant past.  He notes history of heavy drinking but has been sober since March of 2023.  Analysis of his CGM shows TIR 100% with GMI of 5.8%.) An ACE inhibitor/angiotensin II receptor blocker is being taken. He does not see a podiatrist.Eye exam is not current.     Review of systems  Constitutional: + decreasing body weight, current Body mass index is 18.14 kg/m., no fatigue, no subjective hyperthermia, no subjective hypothermia Eyes: no blurry vision, no xerophthalmia ENT: no sore throat, no nodules palpated in throat, no dysphagia/odynophagia, no hoarseness Cardiovascular: no chest pain, no shortness of breath, no palpitations, no leg swelling Respiratory: no cough, no shortness of breath Gastrointestinal: no nausea/vomiting/diarrhea, does have intermittent abdominal pain related to chronic pancreatitis Musculoskeletal: + chronic back pain Skin: no rashes, no hyperemia Neurological: no tremors, no numbness, no tingling, no dizziness Psychiatric: no depression, no anxiety  Objective:     BP (!) 103/58 (BP Location: Left Arm, Patient Position: Sitting, Cuff Size: Normal)   Pulse 77   Wt 99 lb 3.2 oz (45 kg)   BMI 18.14 kg/m   Wt Readings from Last 3 Encounters:  01/30/23 99 lb 3.2 oz (45 kg)  12/31/22 102 lb 6.4 oz (46.4 kg)  12/26/22 110 lb (49.9 kg)     BP  Readings from Last 3 Encounters:  01/30/23 (!) 103/58  12/31/22 108/62  12/26/22 132/67     Physical Exam- Limited  Constitutional:  Body mass index is 18.14 kg/m. , not in acute distress, normal state of mind Eyes:  EOMI, no exophthalmos Neck: Supple Cardiovascular: RRR, + faint murmur, rubs, or gallops, no edema Respiratory: Adequate breathing efforts, no crackles, rales, rhonchi,  or wheezing Musculoskeletal: no gross deformities, strength intact in all four extremities, no gross restriction of joint movements Skin:  no rashes, no hyperemia, +slight nicotinic discoloration to fingernails Neurological: no tremor with outstretched hands   Diabetic Foot Exam - Simple   No data filed      CMP ( most recent) CMP     Component Value Date/Time   NA 140 01/07/2023 1107   K 4.3 01/07/2023 1107   CL 103 01/07/2023 1107   CO2 22 01/07/2023 1107   GLUCOSE 126 (H) 01/07/2023 1107   GLUCOSE 92 12/26/2022 1228   BUN 18 01/07/2023 1107   CREATININE 1.04 01/07/2023 1107   CREATININE 1.16 07/25/2021 0907   CALCIUM 10.1 01/07/2023 1107   PROT 6.6 01/07/2023 1107   ALBUMIN 4.3 01/07/2023 1107   AST 29 01/07/2023 1107   ALT 43 01/07/2023 1107   ALKPHOS 62 01/07/2023 1107   BILITOT 0.2 01/07/2023 1107   EGFR 80 01/07/2023 1107   GFRNONAA 56 (L) 12/26/2022 1228     Diabetic Labs (most recent): Lab Results  Component Value Date   HGBA1C 6.0 (A) 01/30/2023   HGBA1C 7.0 (H) 05/08/2022   HGBA1C 6.8 (H) 01/03/2022     Lipid Panel ( most recent) Lipid Panel     Component Value Date/Time   CHOL 101 01/03/2022 1200   TRIG 113 01/03/2022 1200   HDL 16 (L) 01/03/2022 1200   CHOLHDL 6.3 01/03/2022 1200   VLDL 23 01/03/2022 1200   LDLCALC 62 01/03/2022 1200      No results found for: "TSH", "FREET4"         Assessment & Plan:   1) Type 2 diabetes mellitus with stage 2 chronic kidney disease, without long-term current use of insulin (HCC) (Primary)  He presents today  for his consultation with his CGM showing at goal glycemic profile overall.  His POCT A1c today is 6%, improving from last A1c.  He notes his A1c was as high as 9% a few months back but had been on/off prednisone at that time due to back pain.  He drinks mostly water, sips on diet ginger ale, rarely skim milk, and eats 4 small meals per day (due to chronic pancreatitis).  He does not exercise routinely due to back pain.  He is overdue for eye exam, has plans to get one early next year.  He reports he has seen podiatry but in the distant past.  He notes history of heavy drinking but has been sober since March of 2023.  Analysis of his CGM shows TIR 100% with GMI of 5.8%.  - Kyle Giles has currently uncontrolled symptomatic type 2 DM since 62 years of age, with most recent A1c of 6 %.   -Recent labs reviewed.  - I had a long discussion with him about the progressive nature of diabetes and the pathology behind its complications. -his diabetes is complicated by chronic pancreatitis and CKD stage 3 (sees nephrology) and he remains at a high risk for more acute and chronic complications which include CAD, CVA, CKD, retinopathy, and neuropathy. These are all discussed in detail with him.  The following Lifestyle Medicine recommendations according to American College of Lifestyle Medicine [facility]) were discussed and offered to patient and he agrees to start the journey:  A. Whole Foods, Plant-based plate comprising of fruits and vegetables, plant-based proteins, whole-grain carbohydrates was discussed in detail with the patient.   A list for source of those nutrients were also provided to  the patient.  Patient will use only water or unsweetened tea for hydration. B.  The need to stay away from risky substances including alcohol, smoking; obtaining 7 to 9 hours of restorative sleep, at least 150 minutes of moderate intensity exercise weekly, the importance of healthy social connections,  and stress reduction  techniques were discussed. C.  A full color page of  Calorie density of various food groups per pound showing examples of each food groups was provided to the patient.  - I have counseled him on diet and weight management by adopting a carbohydrate restricted/protein rich diet. Patient is encouraged to switch to unprocessed or minimally processed complex starch and increased protein intake (animal or plant source), fruits, and vegetables. -  he is advised to stick to a routine mealtimes to eat 3 meals a day and avoid unnecessary snacks (to snack only to correct hypoglycemia).   - he acknowledges that there is a room for improvement in his food and drink choices. - Suggestion is made for him to avoid simple carbohydrates from his diet including Cakes, Sweet Desserts, Ice Cream, Soda (diet and regular), Sweet Tea, Candies, Chips, Cookies, Store Bought Juices, Alcohol in Excess of 1-2 drinks a day, Artificial Sweeteners, Coffee Creamer, and "Sugar-free" Products. This will help patient to have more stable blood glucose profile and potentially avoid unintended weight gain.  - I have approached him with the following individualized plan to manage his diabetes and patient agrees:   -He is advised to continue his Jardiance 10 mg po daily (as prescribed by nephrology).  Will stop his Metformin to help de-escalate his treatment plan given his drastic improvement in glycemic control.  His previous chronic heavy alcohol use may have damaged his pancreas which may need insulin treatment in the future, however he is well controlled at this time.  -he is encouraged to start/continue monitoring glucose 2 times daily (currently using CGM but is unsure if he will be able to afford it next year), before meals and before bed, to log their readings on the clinic sheets provided, and bring them to review at follow up appointment in 3 months.  - Adjustment parameters are given to him for hypo and hyperglycemia in  writing. - he is encouraged to call clinic for blood glucose levels less than 70 or above 300 mg /dl.  - he is not a candidate for full dose Metformin due to concurrent renal insufficiency.  - he is not a candidate for incretin therapy given chronic pancreatitis and body habitus with BMI of 18.  - Specific targets for  A1c; LDL, HDL, and Triglycerides were discussed with the patient.  2) Blood Pressure /Hypertension:  his blood pressure is controlled to target.   he is advised to continue his current medications as prescribed by his PCP/nephrologist.  3) Lipids/Hyperlipidemia:    Review of his recent lipid panel from 08/23/22 showed controlled LDL at 97 .  he is advised to continue Atorvastatin 10 mg daily at bedtime.  Side effects and precautions discussed with him.  4)  Weight/Diet:  his Body mass index is 18.14 kg/m.  -   he is NOT a candidate for weight loss.  Exercise, and detailed carbohydrates information provided  -  detailed on discharge instructions.  5) Chronic Care/Health Maintenance: -he is on ACEI/ARB and Statin medications and is encouraged to initiate and continue to follow up with Ophthalmology, Dentist, Podiatrist at least yearly or according to recommendations, and advised to QUIT SMOKING. I have recommended  yearly flu vaccine and pneumonia vaccine at least every 5 years; moderate intensity exercise for up to 150 minutes weekly; and sleep for at least 7 hours a day.  - he is advised to maintain close follow up with Assunta Found, MD for primary care needs, as well as his other providers for optimal and coordinated care.   - Time spent in this patient care: 60 min, of which > 50% was spent in counseling him about his diabetes and the rest reviewing his blood glucose logs, discussing his hypoglycemia and hyperglycemia episodes, reviewing his current and previous labs/studies (including abstraction from other facilities) and medications doses and developing a long term  treatment plan based on the latest standards of care/guidelines; and documenting his care.    Please refer to Patient Instructions for Blood Glucose Monitoring and Insulin/Medications Dosing Guide" in media tab for additional information. Please also refer to "Patient Self Inventory" in the Media tab for reviewed elements of pertinent patient history.  Kyle Giles participated in the discussions, expressed understanding, and voiced agreement with the above plans.  All questions were answered to his satisfaction. he is encouraged to contact clinic should he have any questions or concerns prior to his return visit.     Follow up plan: - Return in about 3 months (around 04/30/2023) for Diabetes F/U with A1c in office, No previsit labs, Bring meter and logs.    Ronny Bacon, [facility] [address] [place], [place] [address] Phone: [phone] Fax: [phone]  01/30/2023, 12:23 PM

## 2023-01-31 ENCOUNTER — Other Ambulatory Visit: Payer: Self-pay | Admitting: Gastroenterology

## 2023-01-31 ENCOUNTER — Ambulatory Visit (HOSPITAL_COMMUNITY)
Admission: RE | Admit: 2023-01-31 | Discharge: 2023-01-31 | Disposition: A | Discharge status: Home / Self Care | Payer: Commercial Managed Care - PPO | Source: Ambulatory Visit | Attending: Gastroenterology

## 2023-01-31 DIAGNOSIS — Q453 Other congenital malformations of pancreas and pancreatic duct: Secondary | ICD-10-CM | POA: Insufficient documentation

## 2023-01-31 DIAGNOSIS — K861 Other chronic pancreatitis: Secondary | ICD-10-CM | POA: Diagnosis not present

## 2023-01-31 DIAGNOSIS — K8689 Other specified diseases of pancreas: Secondary | ICD-10-CM | POA: Diagnosis not present

## 2023-01-31 MED ORDER — GADOBUTROL 1 MMOL/ML IV SOLN
5.0000 mL | Freq: Once | INTRAVENOUS | Status: AC | PRN
  Administered 2023-01-31: 5 mL via INTRAVENOUS

## 2023-02-07 ENCOUNTER — Ambulatory Visit: Payer: Commercial Managed Care - PPO | Admitting: Urology

## 2023-02-07 VITALS — BP 95/57 | HR 66

## 2023-02-07 DIAGNOSIS — N2889 Other specified disorders of kidney and ureter: Secondary | ICD-10-CM | POA: Diagnosis not present

## 2023-02-07 LAB — URINALYSIS, ROUTINE W REFLEX MICROSCOPIC
Bilirubin, UA: NEGATIVE
Ketones, UA: NEGATIVE
Leukocytes,UA: NEGATIVE
Nitrite, UA: NEGATIVE
Protein,UA: NEGATIVE
RBC, UA: NEGATIVE
Specific Gravity, UA: 1.01 (ref 1.005–1.030)
Urobilinogen, Ur: 0.2 mg/dL (ref 0.2–1.0)
pH, UA: 6 (ref 5.0–7.5)

## 2023-02-07 NOTE — Progress Notes (Signed)
 02/07/2023 11:10 AM   Kyle Giles 05-26-1960 [id number]  Referring provider: Douglas Rule, MD [id number] [place],  [place] [address]  Renal mass   HPI: Kyle Giles is a 62yo here for evaluation of a right renal mass. Renal US  from 10/21 showed possible complicated right renal cyst. He denies nay hematuria. He denies any flank pain.    PMH: Past Medical History:  Diagnosis Date   Anxiety    DM type 2 (diabetes mellitus, type 2) (HCC)    GERD (gastroesophageal reflux disease)    Heart murmur    saw dr bernard 06-29-2019 echo done    Hypercholesterolemia    Hypertension    Palpitations    none in a long time as of 10-19-2019   Pancreatitis    S/P colonoscopy 09/2009   Kyle Giles: Moderate diverticulosis throughout colon, otherwise normal    Umbilical hernia     Surgical History: Past Surgical History:  Procedure Laterality Date   BALLOON DILATION N/A 05/02/2022   Procedure: BALLOON DILATION;  Surgeon: Kyle Giles., MD;  Location: [facility];  Service: Gastroenterology;  Laterality: N/A;   BIOPSY  05/02/2022   Procedure: BIOPSY;  Surgeon: Kyle Giles., MD;  Location: [facility];  Service: Gastroenterology;;   BIOPSY  06/10/2022   Procedure: BIOPSY;  Surgeon: Kyle Giles., MD;  Location: [facility];  Service: Gastroenterology;;   COLONOSCOPY N/A 12/21/2019   Procedure: COLONOSCOPY;  Surgeon: Giles Anes, MD;  Location: [facility];  Service: Gastroenterology;  Laterality: N/A;   cyst removed from finger  age 59   ESOPHAGOGASTRODUODENOSCOPY (EGD) WITH PROPOFOL  N/A 08/13/2021   Procedure: ESOPHAGOGASTRODUODENOSCOPY (EGD) WITH PROPOFOL ;  Surgeon: Kyle Carlin POUR, DO;  Location: [facility];  Service: Endoscopy;  Laterality: N/A;  1:30pm   ESOPHAGOGASTRODUODENOSCOPY (EGD) WITH PROPOFOL  N/A 05/02/2022   Procedure: ESOPHAGOGASTRODUODENOSCOPY (EGD) WITH PROPOFOL ;  Surgeon: Kyle Giles., MD;  Location:  [facility];  Service: Gastroenterology;  Laterality: N/A;   ESOPHAGOGASTRODUODENOSCOPY (EGD) WITH PROPOFOL  N/A 06/10/2022   Procedure: ESOPHAGOGASTRODUODENOSCOPY (EGD) WITH PROPOFOL ;  Surgeon: Kyle Giles., MD;  Location: [facility];  Service: Gastroenterology;  Laterality: N/A;   EUS N/A 05/02/2022   Procedure: UPPER ENDOSCOPIC ULTRASOUND (EUS) RADIAL;  Surgeon: Kyle Giles., MD;  Location: [facility];  Service: Gastroenterology;  Laterality: N/A;   HERNIA REPAIR     as baby   PANCREATIC STENT PLACEMENT  05/02/2022   Procedure: CYSTGASTROSTOMY;  Surgeon: Kyle Giles., MD;  Location: [facility];  Service: Gastroenterology;;   POLYPECTOMY  12/21/2019   Procedure: POLYPECTOMY;  Surgeon: Giles Anes, MD;  Location: [facility];  Service: Gastroenterology;;   pyloric stenosis repair     as baby, operated at [facility] REMOVAL  06/10/2022   Procedure: STENT REMOVAL;  Surgeon: Kyle Giles., MD;  Location: [facility];  Service: Gastroenterology;;   UMBILICAL HERNIA REPAIR N/A 10/29/2019   Procedure: OPEN UMBILICAL HERNIA REPAIR WITH MESH;  Surgeon: Kyle Mitzie DELENA, MD;  Location: [facility];  Service: General;  Laterality: N/A;   XI ROBOTIC ASSISTED INGUINAL HERNIA REPAIR WITH MESH Left 05/13/2022   Procedure: XI ROBOTIC ASSISTED INGUINAL HERNIA REPAIR WITH MESH;  Surgeon: Kyle Dorothyann DELENA, DO;  Location: [facility];  Service: General;  Laterality: Left;    Home Medications:  Allergies as of 02/07/2023   No Known Allergies      Medication List        Accurate as of  February 07, 2023 11:10 AM. If you have any questions, ask your nurse or doctor.          ALPRAZolam  1 MG tablet Commonly known as: Xanax  Take 1 tablet (1 mg total) by mouth 3 (three) times daily.   atorvastatin  10 MG tablet Commonly known as: LIPITOR Take 1 tablet (10 mg total) by mouth daily.   dicyclomine  10 MG capsule Commonly known as:  BENTYL  Take 1 capsule (10 mg total) by mouth daily as needed for spasms.   diltiazem  360 MG 24 hr capsule Commonly known as: CARDIZEM  CD Take 1 capsule (360 mg total) by mouth every evening.   esomeprazole  20 MG capsule Commonly known as: NEXIUM  Take 20 mg by mouth daily at 12 noon.   fenofibrate  160 MG tablet Take 1 tablet (160 mg total) by mouth daily.   FreeStyle Libre 3 Plus Sensor Misc Checking sugar daily   Jardiance  10 MG Tabs tablet Generic drug: empagliflozin  Take 1 tablet (10 mg total) by mouth every morning.   olmesartan  20 MG tablet Commonly known as: BENICAR  Take 1 tablet (20 mg total) by mouth daily.   Systane Complete 0.6 % Soln Generic drug: Propylene Glycol Place 1 drop into both eyes daily as needed (dry eyes).   traMADol 50 MG tablet Commonly known as: ULTRAM Take 100 mg by mouth every 6 (six) hours.   Vitamin D  50 MCG (2000 UT) tablet Take 2,000 Units by mouth 2 (two) times daily.        Allergies: No Known Allergies  Family History: Family History  Problem Relation Age of Onset   Colon cancer Neg Hx     Social History:  reports that he has been smoking cigarettes. He has a 45 pack-year smoking history. He has been exposed to tobacco smoke. He has never used smokeless tobacco. He reports that he does not currently use alcohol. He reports that he does not currently use drugs.  ROS: All other review of systems were reviewed and are negative except what is noted above in HPI  Physical Exam: BP (!) 95/57   Pulse 66   Constitutional:  Alert and oriented, No acute distress. HEENT: [place] AT, moist mucus membranes.  Trachea midline, no masses. Cardiovascular: No clubbing, cyanosis, or edema. Respiratory: Normal respiratory effort, no increased work of breathing. GI: Abdomen is soft, nontender, nondistended, no abdominal masses GU: No CVA tenderness.  Lymph: No cervical or inguinal lymphadenopathy. Skin: No rashes, bruises or suspicious  lesions. Neurologic: Grossly intact, no focal deficits, moving all 4 extremities. Psychiatric: Normal mood and affect.  Laboratory Data: Lab Results  Component Value Date   WBC 10.4 12/26/2022   HGB 11.2 (L) 12/26/2022   HCT 35.3 (L) 12/26/2022   MCV 96.7 12/26/2022   PLT 427 (H) 12/26/2022    Lab Results  Component Value Date   CREATININE 1.04 01/07/2023    No results found for: PSA  No results found for: TESTOSTERONE   Lab Results  Component Value Date   HGBA1C 6.0 (A) 01/30/2023    Urinalysis    Component Value Date/Time   COLORURINE YELLOW 12/26/2022 1215   APPEARANCEUR CLEAR 12/26/2022 1215   LABSPEC 1.016 12/26/2022 1215   PHURINE 5.0 12/26/2022 1215   GLUCOSEU >=500 (A) 12/26/2022 1215   HGBUR NEGATIVE 12/26/2022 1215   BILIRUBINUR NEGATIVE 12/26/2022 1215   KETONESUR 5 (A) 12/26/2022 1215   PROTEINUR NEGATIVE 12/26/2022 1215   NITRITE NEGATIVE 12/26/2022 1215   LEUKOCYTESUR NEGATIVE 12/26/2022 1215  Lab Results  Component Value Date   BACTERIA NONE SEEN 12/26/2022    Pertinent Imaging: Renal US  11/25/2022: Images reviewed and discussed with the patient  No results found for this or any previous visit.  No results found for this or any previous visit.  No results found for this or any previous visit.  No results found for this or any previous visit.  Results for orders placed during the hospital encounter of 11/25/22  US  RENAL  Narrative CLINICAL DATA:  Chronic renal disease  EXAM: RENAL / URINARY TRACT ULTRASOUND COMPLETE  COMPARISON:  CT abdomen 05/31/2022  FINDINGS: Right Kidney:  Renal measurements: 11.4 x 5.8 x 5.1 cm = volume: 173.3 mL. Normal renal cortical thickness and echogenicity. No hydronephrosis. There is a 1 cm hypoechoic lesion, likely a complicated cyst.  Left Kidney:  Renal measurements: 9.9 x 5.6 x 4.7 cm = volume: 136.6 mL. Normal renal cortical thickness and echogenicity. No hydronephrosis. There is a  2.3 cm hypoechoic lesion, potentially a small cyst. No imaging follow-up needed.  Bladder:  Appears normal for degree of bladder distention.  Other:  None.  IMPRESSION: No hydronephrosis.  Probable complicated cyst right kidney. Recommend follow-up renal ultrasound in 6 months.   Electronically Signed By: Bard Moats M.D. On: 11/25/2022 13:18  No results found for this or any previous visit.  No results found for this or any previous visit.  No results found for this or any previous visit.   Assessment & Plan:    1. Renal mass (Primary) -We discussed the benign nature of these cysts and no need for no further imaging. He can followup PRN  - Urinalysis, Routine w reflex microscopic   No follow-ups on file.  Belvie Clara, MD  [facility] [place]

## 2023-02-12 ENCOUNTER — Other Ambulatory Visit (HOSPITAL_COMMUNITY): Payer: Self-pay

## 2023-02-17 ENCOUNTER — Other Ambulatory Visit (HOSPITAL_COMMUNITY): Payer: Self-pay

## 2023-02-17 MED ORDER — FREESTYLE LIBRE 3 PLUS SENSOR MISC
1 refills | Status: DC
  Filled 2023-02-17: qty 2, 30d supply, fill #0
  Filled 2023-03-31: qty 2, 30d supply, fill #1

## 2023-02-18 ENCOUNTER — Encounter: Payer: Self-pay | Admitting: Urology

## 2023-02-18 ENCOUNTER — Other Ambulatory Visit: Payer: Self-pay

## 2023-02-18 ENCOUNTER — Other Ambulatory Visit (HOSPITAL_BASED_OUTPATIENT_CLINIC_OR_DEPARTMENT_OTHER): Payer: Self-pay

## 2023-02-18 NOTE — Patient Instructions (Signed)

## 2023-02-19 ENCOUNTER — Other Ambulatory Visit (HOSPITAL_COMMUNITY): Payer: Self-pay

## 2023-02-24 ENCOUNTER — Other Ambulatory Visit (HOSPITAL_COMMUNITY): Payer: Self-pay

## 2023-02-24 ENCOUNTER — Other Ambulatory Visit: Payer: Self-pay

## 2023-02-24 MED ORDER — ALPRAZOLAM 1 MG PO TABS
1.0000 mg | ORAL_TABLET | Freq: Three times a day (TID) | ORAL | 0 refills | Status: AC
  Filled 2023-02-24: qty 270, 90d supply, fill #0

## 2023-02-28 NOTE — Progress Notes (Unsigned)
GI Office Note    Referring Provider: Assunta Found, MD Primary Care Physician:  Assunta Found, MD Primary Gastroenterologist: Hennie Duos. [name], DO  Date:  03/06/2023  ID:  Kyle Giles, DOB Jun 26, 1960, MRN [id number]   Chief Complaint   Chief Complaint  Patient presents with   Follow-up    Wants to discuss recent MRI and also discuss pancreatic enzyme treatment   History of Present Illness  Kyle Giles is a 63 y.o. male with a history of chronic pancreatitis c/b pseudocyst, hepatic steatosis, anxiety, HTN, and ?diabetes presenting today with complaint of   Colonoscopy in 2021 by Dr. Lovell Sheehan which showed 1 hyperplastic polyp.   Hospital admission in March 2023 for acute pancreatitis.  Felt to be alcohol induced as patient was simply drinking with 6 beers as well as liquor daily.  CT imaging showed fatty liver and with mention of duodenitis likely secondary to his pancreatitis.  Denied NSAID use.Marland Kitchen  Has some abnormal LFTs during hospitalization.  Viral hepatitis panel negative.  Improved shortly with conservative measures and was discharged home.  Recovered well but then started having recurrent epigastric pain which prompted an ER visit in April 2023 which she was discharged home.  He had began taking Nexium daily.   EGD July 2023: -Normal esophagus, stomach, and duodenum -Continue Nexium 20 mg daily   Hospitalized 01/02/2022 - 01/05/22 for acute on chronic pancreatitis. Presenting with 4 days of abdominal pain and advised to proceed to the hospital with recommendation of his PCP regarding high lipase and AKI.  He denied any recent medication changes.  Prior workup with normal triglycerides and calcium.  At this time he also had acute kidney injury.  Creatinine 3.6 on admission.  Likely due to dehydration.  CT imaging as outlined below.  Advised to start taking pancreatic enzymes once able to tolerate solids (Creon 48,000 units with meals and 24,000 units with snacks).  Also  recommended complete alcohol cessation.  Outpatient follow-up in 4 weeks.  Dedicated pancreatic CT in 6-8 weeks.   CT A/P 01/02/2022: -5.7 cm cystic lesion in tail of pancreas likely pseudocyst -Peripancreatic fluid seen -Peripancreatic fluid and stranding extending from the pseudocyst to the lesser curvature of the stomach -No free air to suggest perforation -Mild prominence of main pancreatic duct increased from prior now measuring approximately 5 mm -Coarse calcifications in the pancreatic head -Hepatobiliary system within normal limits -Indistinctness of the wall of the lesser curvature of the stomach which appears, confluent with peripancreatic stranding. -Extensive colonic diverticulosis without diverticulitis   Patient seen in the office 02/13/2022 and 03/05/2022.  At the beginning of January his GERD was fairly well-controlled.  Was having significant major fatigue and falling asleep during the day.  Recently resigned from his job.  Had a good appetite and no weight loss.  Also not experiencing any diarrhea, primarily having constipation using MiraLAX.  Denied any melena or BRBPR.  Had occasional abdominal pain but also having a lot more gas.  Occasional NSAID use.  Denied any pica.  Taking vitamin D.  Advised to check CBC, iron panel, B12, folate, and vitamin A, D, E and K.  He was scheduled for CT pancreatic protocol.  Refilled Nexium.  Advised to continue MiraLAX as needed.  At the end of January patient reported having tenderness vomiting a CT scan then had more extensive severe pain that night at dinner.  Reducing to a blander diet was helping.  Had a couple days without pain but then pain  returned intermittently.  He denies any nausea or vomiting and has been drinking more water.  Also complaining of pain to his left groin area stating a possible hernia that had grown in size.  On exam he did have a golf ball sized soft tissue mass that was nontender.  He was referred to general surgery to  discuss hernia repair.  He was referred to Dr. Meridee Score for EUS and possible cyst gastrostomy.  As stated below MRI/MRCP ordered per his recommendation.   CT A/P with and without contrast 02/26/22: -Interval enlargement of unilocular cystic lesion along the body of the pancreas most consistent with pancreatic pseudocyst -No acute pancreatic inflammation -Ductus divisum variant ductal anatomy with calcification at the orifice of the accessory duct.  No significant ductal dilation currently.   Per Dr. Meridee Score fluid collection appears to be large enough to drain which can be done via endoscopic ultrasound  given his pain returning this would likely be best. He has some pancreatic anatomy which can put him at risk for recurrent pancreatitis (this is called pancreatic divisum) and further evaluation can be performed with an MRI/MRCP that would allow a better look at his anatomy for Dr. Meridee Score to discuss possible treatment options.    MRI/MRCP 03/27/2022: -Decrease size of 6.2 cm pseudocyst adjacent to pancreatic tail -Findings of chronic pancreatitis and pancreas divisum -No evidence of acute pancreatic inflammation. -Advised to keep follow-up with EUS with Dr. Meridee Score already planned for the end of March   OV 04/10/22.  Continued intermittent epigastric pain as well as left lower quadrant/inguinal pain.  Has a good appetite.  Symptoms better controlled without overeating.  Still with some weight loss and occasional constipation but no diarrhea.  Continue on Nexium once daily with some intermittent reflux symptoms.  No alcohol use or NSAIDs.  Advised to keep follow-up with surgery for inguinal hernia repair.  Follow low-fat diet.  Continue PPI once daily.  Focus on protein intake for nutrition.  Monitor weight.   EGD/EUS 05/02/22 EGD impression: - No gross lesions in the entire esophagus. Z- line regular, 40 cm from the incisors. - Erythematous mucosa in the gastric body. No other gross lesions  in the entire stomach. Biopsied. - No gross lesions in the duodenal bulb. - Duodenal deformity in the duodenal sweep ( as noted above query healed groove pancreatitis versus healed ulcer) - Normal major papilla. - Congested duodenal mucosa in the region of the minor papilla ( an area suggestive of a pancreatic duct was noted but the amount of swelling in this region makes finding of the true minor papilla difficult)   EUS impression: - A cystic lesion was seen in the pancreatic body and pancreatic tail. Tissue has not been obtained. However, the endosonographic appearance is consistent with a pancreatic pseudocyst. Cystgastrostomy created ( 15 mm AXIOS with 7 [place] by 5 cm double- pigtail through it) . - The pancreatic duct had a dilated endosonographic appearance in the pancreatic head and genu of the pancreas. The pancreatic duct measured up to 4 mm in diameter. - Findings suggestive of pancreatic stones were identified in the pancreatic head and accessory pancreatic duct. It was difficult to evaluate the rest of the pancreatic duct as a result of the pseudocyst. - Pancreatic parenchymal abnormalities consisting of lobularity and hyperechoic strands were noted in the pancreatic head and genu of the pancreas. It was difficult to evaluate the rest of the pancreas parenchyma as a result of the pseudocyst. - Hyperechoic material consistent with sludge  was visualized endosonographically in the gallbladder. - No malignant- appearing lymph nodes were visualized in the celiac region ( level 20) , peripancreatic region and porta hepatis region.   Left inguinal hernia repair on 05/13/22.    CT abdomen with contrast 05/31/22: IMPRESSION: 1. Sequela of prior pancreatitis with interval placement of cystogastrostomy stent with resolution of the previously noted pseudocyst in the pancreatic body/tail. 2. Peripheral subcentimeter hypodensity measuring 8 mm in the pancreatic head is new from 03/27/2022 and may represent a  side branch intraductal papillary mucinous neoplasms (IPMN) or small pseudocyst. Consider contrast-enhanced MRI/MRCP in 1 year.   Repeat EGD 06/10/22 with Dr. Meridee Score: -salmon colored mucosa to islands suspicious or barrett's s/p biopsy.  -non obstructing schatzki ring -2 cm hiatal hernia -Pre-existing Axios cystogastrostomy stent was noted and removed -Mild gastritis not rebiopsied -Normal duodenum -Recommended follow-up in the clinic to discuss several symptoms with repeat MRI/MRCP in 3-4 months.   KUB 06/12/22: -cystogastrostomy stents no longer seen -chronic calcific pancreatitis   Last office visit 07/03/2022.  Reportedly feeling pretty well compared to prior, his fatigue has improved as well.  He was back doing yard work but stamina was slowly increasing.  He had quit taking Nexium in March and denied any recent issues with reflux.  Does keep Mylanta at home and had only taken a handful of times.  Had some difficulty with healing from inguinal hernia but overall doing very well.  Denied any nausea, vomiting, or abdominal pain.  Had been using MiraLAX as needed for constipation.  Advised MRI/MRCP in August 2024 to reevaluate pancreatic abnormality and discuss treatment for pancreatic divisum.  Advised to continue alcohol cessation and low-fat diet.   ED visit 12/26/2022 for abdominal pain.  He was noted to have abdominal tenderness on exam.  Labs revealed a lipase of 107, AST 56, ALT 74, creatinine 1.41, BUN 25, hemoglobin 11.2, platelets 427, UA with glucose and ketones present.  Underwent CT scan as noted below.  He was treated with oxycodone and advised to clear liquid diet for several days and advised to follow-up with GI.   CT A/P with contrast: -Acute pancreatitis with marked edema and peripancreatic fat stranding with head and body of pancreas -Chronic subcentimeter pseudocyst or IPMN within the inflamed pancreatic head again noted and unchanged -No new organized fluid collection to  suggest abscess or acute pseudocyst -Diffuse colonic diverticulosis without diverticulitis  Last office visit 12/31/22.  Reported some mild constipation and was taking MiraLAX initially but then switched to Senokot.  Was taking Mylanta only as needed for reflux feeling like his reflux was more controlled.  Noted slow healing from his most recent pancreatitis episode, had been trying to follow necessary diet recommendations.  Discussed continuing with his MRI/MRCP that was coming up.  We discussed need for pancreatic enzymes if he were to continue to have recurrent pancreatitis.  MRI/MRCP 01/31/2023 IMPRESSION: -Chronic pancreatitis, with resolution of previously seen large pseudocyst in the pancreatic tail. No radiographic signs of acute pancreatitis. - Pancreas divisum again noted. -New 2.3 cm and 1.0 cm simple cystic lesions in the pancreatic neck and head, consistent with pseudocysts. Recommend continued follow-up by MRI in 6 months.  Today:  Has been weighing himself at home - an average he is around 95 pounds. Weight was 98 at PCP office  last week and 98lb today. Has a good appetite .; does not dread eating anymore.   He  eats a lot of grilled chicken and lean pork,. Does not like  to cook fish at home. Sometimes will grill hamburgers. Has started back with protein shakes. When he was having pain prior he stopped drinking them but recently started back. Usually gets one full shake per day - currently has 15g. First one he had was 30g and felt way to full with that. Currently eating some greek yogurt as well. Las A1c was 6. Has follow up soon in March for endocrine. Has stopped metformin and only on jardiance.   Taking Nexium again and taking just nightly to help with symptoms.   Wt Readings from Last 10 Encounters:  03/06/23 97 lb 9.6 oz (44.3 kg)  01/30/23 99 lb 3.2 oz (45 kg)  12/31/22 102 lb 6.4 oz (46.4 kg)  12/26/22 110 lb (49.9 kg)  07/03/22 115 lb (52.2 kg)  06/10/22 110 lb (49.9  kg)  05/28/22 109 lb (49.4 kg)  05/08/22 (P) 105 lb (47.6 kg)  05/02/22 105 lb (47.6 kg)  04/10/22 109 lb 9.6 oz (49.7 kg)    Current Outpatient Medications  Medication Sig Dispense Refill   ALPRAZolam (XANAX) 1 MG tablet Take 1 tablet (1 mg total) by mouth 3 (three) times daily. 270 tablet 0   atorvastatin (LIPITOR) 10 MG tablet Take 1 tablet (10 mg total) by mouth daily. 90 tablet 3   Cholecalciferol (VITAMIN D) 50 MCG (2000 UT) tablet Take 2,000 Units by mouth 2 (two) times daily.     Continuous Glucose Sensor (FREESTYLE LIBRE 3 PLUS SENSOR) MISC Checking sugar daily. Change sensor every 15 days 2 each 1   dicyclomine (BENTYL) 10 MG capsule Take 1 capsule (10 mg total) by mouth daily as needed for spasms. 30 capsule 1   diltiazem (CARDIZEM CD) 360 MG 24 hr capsule Take 1 capsule (360 mg total) by mouth every evening. 90 capsule 3   empagliflozin (JARDIANCE) 10 MG TABS tablet Take 1 tablet (10 mg total) by mouth every morning. 30 tablet 11   esomeprazole (NEXIUM) 20 MG capsule Take 20 mg by mouth daily at 12 noon.     fenofibrate 160 MG tablet Take 1 tablet (160 mg total) by mouth daily. 90 tablet 2   olmesartan (BENICAR) 20 MG tablet Take 1 tablet (20 mg total) by mouth daily. 30 tablet 11   Propylene Glycol (SYSTANE COMPLETE) 0.6 % SOLN Place 1 drop into both eyes daily as needed (dry eyes).     traMADol (ULTRAM) 50 MG tablet Take 100 mg by mouth every 6 (six) hours.     No current facility-administered medications for this visit.    Past Medical History:  Diagnosis Date   Anxiety    DM type 2 (diabetes mellitus, type 2) (HCC)    GERD (gastroesophageal reflux disease)    Heart murmur    saw dr Margreta Journey 06-29-2019 echo done    Hypercholesterolemia    Hypertension    Palpitations    none in a long time as of 10-19-2019   Pancreatitis    S/P colonoscopy 09/2009   Dr. Lovell Sheehan: Moderate diverticulosis throughout colon, otherwise normal    Umbilical hernia     Past Surgical  History:  Procedure Laterality Date   BALLOON DILATION N/A 05/02/2022   Procedure: BALLOON DILATION;  Surgeon: Lemar Lofty., MD;  Location: [facility];  Service: Gastroenterology;  Laterality: N/A;   BIOPSY  05/02/2022   Procedure: BIOPSY;  Surgeon: Meridee Score Netty Starring., MD;  Location: [facility];  Service: Gastroenterology;;   BIOPSY  06/10/2022   Procedure: BIOPSY;  Surgeon: Meridee Score,  Netty Starring., MD;  Location: [facility];  Service: Gastroenterology;;   COLONOSCOPY N/A 12/21/2019   Procedure: COLONOSCOPY;  Surgeon: Franky Macho, MD;  Location: [facility];  Service: Gastroenterology;  Laterality: N/A;   cyst removed from finger  age 54   ESOPHAGOGASTRODUODENOSCOPY (EGD) WITH PROPOFOL N/A 08/13/2021   Procedure: ESOPHAGOGASTRODUODENOSCOPY (EGD) WITH PROPOFOL;  Surgeon: Lanelle Bal, DO;  Location: [facility];  Service: Endoscopy;  Laterality: N/A;  1:30pm   ESOPHAGOGASTRODUODENOSCOPY (EGD) WITH PROPOFOL N/A 05/02/2022   Procedure: ESOPHAGOGASTRODUODENOSCOPY (EGD) WITH PROPOFOL;  Surgeon: Meridee Score Netty Starring., MD;  Location: [facility];  Service: Gastroenterology;  Laterality: N/A;   ESOPHAGOGASTRODUODENOSCOPY (EGD) WITH PROPOFOL N/A 06/10/2022   Procedure: ESOPHAGOGASTRODUODENOSCOPY (EGD) WITH PROPOFOL;  Surgeon: Meridee Score Netty Starring., MD;  Location: [facility];  Service: Gastroenterology;  Laterality: N/A;   EUS N/A 05/02/2022   Procedure: UPPER ENDOSCOPIC ULTRASOUND (EUS) RADIAL;  Surgeon: Lemar Lofty., MD;  Location: [facility];  Service: Gastroenterology;  Laterality: N/A;   HERNIA REPAIR     as baby   PANCREATIC STENT PLACEMENT  05/02/2022   Procedure: CYSTGASTROSTOMY;  Surgeon: Mansouraty, Netty Starring., MD;  Location: [facility];  Service: Gastroenterology;;   POLYPECTOMY  12/21/2019   Procedure: POLYPECTOMY;  Surgeon: Franky Macho, MD;  Location: [facility];  Service: Gastroenterology;;   pyloric stenosis repair     as baby,  operated at [facility] REMOVAL  06/10/2022   Procedure: STENT REMOVAL;  Surgeon: Lemar Lofty., MD;  Location: [facility];  Service: Gastroenterology;;   UMBILICAL HERNIA REPAIR N/A 10/29/2019   Procedure: OPEN UMBILICAL HERNIA REPAIR WITH MESH;  Surgeon: Berna Bue, MD;  Location[facility];  Service: General;  Laterality: N/A;   XI ROBOTIC ASSISTED INGUINAL HERNIA REPAIR WITH MESH Left 05/13/2022   Procedure: XI ROBOTIC ASSISTED INGUINAL HERNIA REPAIR WITH MESH;  Surgeon: Lewie Chamber, DO;  Location: [facility];  Service: General;  Laterality: Left;    Family History  Problem Relation Age of Onset   Colon cancer Neg Hx     Allergies as of 03/06/2023   (No Known Allergies)    Social History   Socioeconomic History   Marital status: Married    Spouse name: Not on file   Number of children: 2   Years of education: Not on file   Highest education level: Not on file  Occupational History   Occupation: Higher education careers adviser: [employer]    Comment: [employer]  Tobacco Use   Smoking status: Every Day    Current packs/day: 1.50    Average packs/day: 1.5 packs/day for 30.0 years (45.0 ttl pk-yrs)    Types: Cigarettes    Passive exposure: Current   Smokeless tobacco: Never   Tobacco comments:    1 ppd since 3 weeks as of 10-19-2019  Vaping Use   Vaping status: Never Used  Substance and Sexual Activity   Alcohol use: Not Currently    Comment: quit 05/30/21   Drug use: Not Currently   Sexual activity: Not on file  Other Topics Concern   Not on file  Social History Narrative   Not on file   Social Drivers of Health   Financial Resource Strain: Not on file  Food Insecurity: No Food Insecurity (01/02/2022)   Hunger Vital Sign    Worried About Running Out of Food in the Last Year: Never true    Ran Out of Food in the Last Year:  Never true  Transportation Needs: No Transportation Needs (01/02/2022)   PRAPARE -  Administrator, Civil Service (Medical): No    Lack of Transportation (Non-Medical): No  Physical Activity: Not on file  Stress: Not on file  Social Connections: Not on file     Review of Systems   Gen: Denies fever, chills, anorexia. Denies fatigue, weakness, weight loss.  CV: Denies chest pain, palpitations, syncope, peripheral edema, and claudication. Resp: Denies dyspnea at rest, cough, wheezing, coughing up blood, and pleurisy. GI: See HPI Derm: Denies rash, itching, dry skin Psych: Denies depression, anxiety, memory loss, confusion. No homicidal or suicidal ideation.  Heme: Denies bruising, bleeding, and enlarged lymph nodes.  Physical Exam   BP 102/61 (BP Location: Right Arm, Patient Position: Sitting, Cuff Size: Normal)   Pulse 73   Temp 98.5 F (36.9 C) (Oral)   Ht 5\' 2"  (1.575 m)   Wt 97 lb 9.6 oz (44.3 kg)   SpO2 95%   BMI 17.85 kg/m   General:   Alert and oriented. No distress noted. Thin. Pleasant and cooperative.  Head:  Normocephalic and atraumatic. Eyes:  Conjuctiva clear without scleral icterus. Mouth:  Oral mucosa pink and moist. Rectal: deferred Msk:  Symmetrical without gross deformities. Normal posture. Extremities:  Without edema. Neurologic:  Alert and oriented x4 Psych:  Alert and cooperative. Normal mood and affect.  Assessment  Kyle Giles is a 63 y.o. male with a history of chronic pancreatitis c/b pseudocyst, hepatic steatosis, anxiety, HTN, and diabetes presenting today with   Pancreas divisum, pancreatic pseudocyst, chronic pancreatitis: Multiple prior hospitalizations and ED visits for pancreatitis dating back to early 2023.  Etiology suspected to be likely secondary to frequent alcohol use as well as pancreas divisum.  Course has been complicated by large pseudocysts requiring EUS with placement of cystogastrostomy for drainage.  Further imaging with MRI also found pancreas divisum.  Most recent episode of acute pancreatitis  was in November 2024.  Secondary MRI/MRCP revealed resolution of large pseudocyst within the tail of the pancreas however he did have evidence of 2 new small pseudocyst measuring 2.3 and 1 cm pseudocysts within the neck and head of the pancreas.  Currently doing without any significant abdominal pain.  Does have some slow weight loss as noted below.  We have discussed potential need for pancreatic enzymes in the past, patient wanted to discuss this further today.  We did discuss that if he continues to have some weight loss despite increasing protein and calorie intake then we may need to consider this as it may be a malabsorption issue.  Will plan to have follow-up MRI in June.  Weight loss, low BMI: Current BMI 17.85.  Current weight is around 97 pounds per our scales (patient states he is pretty consistent and 95 pounds at home).  Per review of weights he has lost about 10-12 pounds over the last year.  Most recently since his episode of pancreatitis in November he has had improved appetite and is working on following a carb consistent diet given his diabetes.  He his blood sugars have been under much better control as of recently.  GERD: Previously without PPI at last office visit and was doing fairly well with diet control.  Given increased frequency of symptoms, he resumed Nexium on his own with over-the-counter and would like to continue on this and would like prescription.  Sent for him today.  PLAN   Protein/calorie goal given, encouraged ongoing supplementation in  his diet. High protein/high calorie diet recommended.  MRI in June 2025 Adequate hydration Avoid alcohol Monitor weights Nexium 20 mg once daily.  Discussed pancreatic enzyme therapy.  If not able to increase weight with high-protein and high-calorie diet then we may need to consider initiating pancreatic enzymes. Follow up in 3 months.     Brooke Bonito, MSN, FNP-BC, AGACNP-BC [facility]

## 2023-03-03 ENCOUNTER — Other Ambulatory Visit (HOSPITAL_COMMUNITY): Payer: Self-pay

## 2023-03-06 ENCOUNTER — Other Ambulatory Visit: Payer: Self-pay

## 2023-03-06 ENCOUNTER — Ambulatory Visit: Payer: Commercial Managed Care - PPO | Admitting: Gastroenterology

## 2023-03-06 ENCOUNTER — Encounter: Payer: Self-pay | Admitting: Gastroenterology

## 2023-03-06 VITALS — BP 102/61 | HR 73 | Temp 98.5°F | Ht 62.0 in | Wt 97.6 lb

## 2023-03-06 DIAGNOSIS — R634 Abnormal weight loss: Secondary | ICD-10-CM

## 2023-03-06 DIAGNOSIS — K861 Other chronic pancreatitis: Secondary | ICD-10-CM

## 2023-03-06 DIAGNOSIS — K8681 Exocrine pancreatic insufficiency: Secondary | ICD-10-CM

## 2023-03-06 DIAGNOSIS — Q453 Other congenital malformations of pancreas and pancreatic duct: Secondary | ICD-10-CM | POA: Diagnosis not present

## 2023-03-06 DIAGNOSIS — Z681 Body mass index (BMI) 19 or less, adult: Secondary | ICD-10-CM | POA: Diagnosis not present

## 2023-03-06 DIAGNOSIS — K219 Gastro-esophageal reflux disease without esophagitis: Secondary | ICD-10-CM | POA: Diagnosis not present

## 2023-03-06 DIAGNOSIS — K863 Pseudocyst of pancreas: Secondary | ICD-10-CM

## 2023-03-06 MED ORDER — ESOMEPRAZOLE MAGNESIUM 20 MG PO CPDR
20.0000 mg | DELAYED_RELEASE_CAPSULE | Freq: Every day | ORAL | 11 refills | Status: DC
  Filled 2023-03-06: qty 30, 30d supply, fill #0
  Filled 2023-04-12: qty 30, 30d supply, fill #1
  Filled 2023-05-19: qty 30, 30d supply, fill #2
  Filled 2023-06-15: qty 30, 30d supply, fill #3
  Filled 2023-07-09 – 2023-07-10 (×2): qty 30, 30d supply, fill #4

## 2023-03-06 NOTE — Patient Instructions (Addendum)
Pancreatic enzyme options: (You can let the insurance company know this is for pancreatic insufficiency secondary to chronic pancreatitis). Creon Zenpep  I have sent in Nexium 20 mg once daily to the pharmacy for you.  We will need to repeat your MRI in June to further evaluate your pancreatic cysts.  For your weight; High-protein/high-calorie diet Daily calorie goal: 1800-2100 calories daily for weight gain, minimal 1625 to maintain weight.  Daily protein goal: minimum 80-110g daily  Monitor your weight frequently and let me know if you have any significant weight loss between now and your follow-up.  Follow up in 3 months.   It was a pleasure to see you today. I want to create trusting relationships with patients. If you receive a survey regarding your visit,  I greatly appreciate you taking time to fill this out on paper or through your MyChart. I value your feedback.  Brooke Bonito, MSN, FNP-BC, AGACNP-BC [facility]

## 2023-03-10 ENCOUNTER — Other Ambulatory Visit (HOSPITAL_COMMUNITY): Payer: Self-pay

## 2023-03-12 DIAGNOSIS — X32XXXA Exposure to sunlight, initial encounter: Secondary | ICD-10-CM | POA: Diagnosis not present

## 2023-03-12 DIAGNOSIS — L568 Other specified acute skin changes due to ultraviolet radiation: Secondary | ICD-10-CM | POA: Diagnosis not present

## 2023-03-12 DIAGNOSIS — D485 Neoplasm of uncertain behavior of skin: Secondary | ICD-10-CM | POA: Diagnosis not present

## 2023-03-17 ENCOUNTER — Ambulatory Visit: Payer: Commercial Managed Care - PPO | Admitting: Urology

## 2023-03-20 DIAGNOSIS — E785 Hyperlipidemia, unspecified: Secondary | ICD-10-CM | POA: Diagnosis not present

## 2023-03-20 DIAGNOSIS — R809 Proteinuria, unspecified: Secondary | ICD-10-CM | POA: Diagnosis not present

## 2023-03-20 DIAGNOSIS — R8290 Unspecified abnormal findings in urine: Secondary | ICD-10-CM | POA: Diagnosis not present

## 2023-03-20 DIAGNOSIS — E875 Hyperkalemia: Secondary | ICD-10-CM | POA: Diagnosis not present

## 2023-03-20 DIAGNOSIS — N1832 Chronic kidney disease, stage 3b: Secondary | ICD-10-CM | POA: Diagnosis not present

## 2023-03-20 DIAGNOSIS — I129 Hypertensive chronic kidney disease with stage 1 through stage 4 chronic kidney disease, or unspecified chronic kidney disease: Secondary | ICD-10-CM | POA: Diagnosis not present

## 2023-03-20 DIAGNOSIS — N2889 Other specified disorders of kidney and ureter: Secondary | ICD-10-CM | POA: Diagnosis not present

## 2023-03-20 DIAGNOSIS — E1122 Type 2 diabetes mellitus with diabetic chronic kidney disease: Secondary | ICD-10-CM | POA: Diagnosis not present

## 2023-03-31 ENCOUNTER — Other Ambulatory Visit (HOSPITAL_COMMUNITY): Payer: Self-pay

## 2023-03-31 DIAGNOSIS — E1122 Type 2 diabetes mellitus with diabetic chronic kidney disease: Secondary | ICD-10-CM | POA: Diagnosis not present

## 2023-03-31 DIAGNOSIS — I129 Hypertensive chronic kidney disease with stage 1 through stage 4 chronic kidney disease, or unspecified chronic kidney disease: Secondary | ICD-10-CM | POA: Diagnosis not present

## 2023-03-31 DIAGNOSIS — E785 Hyperlipidemia, unspecified: Secondary | ICD-10-CM | POA: Diagnosis not present

## 2023-03-31 DIAGNOSIS — N2889 Other specified disorders of kidney and ureter: Secondary | ICD-10-CM | POA: Diagnosis not present

## 2023-03-31 DIAGNOSIS — E875 Hyperkalemia: Secondary | ICD-10-CM | POA: Diagnosis not present

## 2023-03-31 DIAGNOSIS — N1832 Chronic kidney disease, stage 3b: Secondary | ICD-10-CM | POA: Diagnosis not present

## 2023-03-31 DIAGNOSIS — R809 Proteinuria, unspecified: Secondary | ICD-10-CM | POA: Diagnosis not present

## 2023-04-14 ENCOUNTER — Other Ambulatory Visit: Payer: Self-pay

## 2023-04-14 ENCOUNTER — Other Ambulatory Visit (HOSPITAL_COMMUNITY): Payer: Self-pay

## 2023-04-21 ENCOUNTER — Other Ambulatory Visit (HOSPITAL_COMMUNITY): Payer: Self-pay

## 2023-05-01 ENCOUNTER — Ambulatory Visit: Payer: Commercial Managed Care - PPO | Admitting: Nurse Practitioner

## 2023-05-01 ENCOUNTER — Encounter: Payer: Self-pay | Admitting: Nurse Practitioner

## 2023-05-01 ENCOUNTER — Other Ambulatory Visit (HOSPITAL_COMMUNITY): Payer: Self-pay

## 2023-05-01 VITALS — BP 110/60 | HR 72 | Ht 62.0 in | Wt 97.6 lb

## 2023-05-01 DIAGNOSIS — E782 Mixed hyperlipidemia: Secondary | ICD-10-CM

## 2023-05-01 DIAGNOSIS — N182 Chronic kidney disease, stage 2 (mild): Secondary | ICD-10-CM

## 2023-05-01 DIAGNOSIS — I10 Essential (primary) hypertension: Secondary | ICD-10-CM

## 2023-05-01 DIAGNOSIS — E1122 Type 2 diabetes mellitus with diabetic chronic kidney disease: Secondary | ICD-10-CM

## 2023-05-01 DIAGNOSIS — Z7984 Long term (current) use of oral hypoglycemic drugs: Secondary | ICD-10-CM | POA: Diagnosis not present

## 2023-05-01 LAB — POCT GLYCOSYLATED HEMOGLOBIN (HGB A1C): Hemoglobin A1C: 7.1 % — AB (ref 4.0–5.6)

## 2023-05-01 NOTE — Progress Notes (Signed)
 Endocrinology Follow Up Note       05/01/2023, 1:18 PM   Subjective:    Patient ID: Kyle Giles, male    DOB: [birth year].  Kyle Giles is being seen in follow up after being seen in consultation for management of currently uncontrolled symptomatic diabetes requested by  Assunta Found, MD.   Past Medical History:  Diagnosis Date   Anxiety    DM type 2 (diabetes mellitus, type 2) (HCC)    GERD (gastroesophageal reflux disease)    Heart murmur    saw dr Margreta Journey 06-29-2019 echo done    Hypercholesterolemia    Hypertension    Palpitations    none in a long time as of 10-19-2019   Pancreatitis    S/P colonoscopy 09/2009   Dr. Lovell Sheehan: Moderate diverticulosis throughout colon, otherwise normal    Umbilical hernia     Past Surgical History:  Procedure Laterality Date   BALLOON DILATION N/A 05/02/2022   Procedure: BALLOON DILATION;  Surgeon: Lemar Lofty., MD;  Location: [facility];  Service: Gastroenterology;  Laterality: N/A;   BIOPSY  05/02/2022   Procedure: BIOPSY;  Surgeon: Meridee Score Netty Starring., MD;  Location: [facility];  Service: Gastroenterology;;   BIOPSY  06/10/2022   Procedure: BIOPSY;  Surgeon: Lemar Lofty., MD;  Location: [facility];  Service: Gastroenterology;;   COLONOSCOPY N/A 12/21/2019   Procedure: COLONOSCOPY;  Surgeon: Franky Macho, MD;  Location: [facility];  Service: Gastroenterology;  Laterality: N/A;   cyst removed from finger  age 3   ESOPHAGOGASTRODUODENOSCOPY (EGD) WITH PROPOFOL N/A 08/13/2021   Procedure: ESOPHAGOGASTRODUODENOSCOPY (EGD) WITH PROPOFOL;  Surgeon: Lanelle Bal, DO;  Location: [facility];  Service: Endoscopy;  Laterality: N/A;  1:30pm   ESOPHAGOGASTRODUODENOSCOPY (EGD) WITH PROPOFOL N/A 05/02/2022   Procedure: ESOPHAGOGASTRODUODENOSCOPY (EGD) WITH PROPOFOL;  Surgeon: Meridee Score Netty Starring., MD;  Location: [facility];   Service: Gastroenterology;  Laterality: N/A;   ESOPHAGOGASTRODUODENOSCOPY (EGD) WITH PROPOFOL N/A 06/10/2022   Procedure: ESOPHAGOGASTRODUODENOSCOPY (EGD) WITH PROPOFOL;  Surgeon: Meridee Score Netty Starring., MD;  Location: [facility];  Service: Gastroenterology;  Laterality: N/A;   EUS N/A 05/02/2022   Procedure: UPPER ENDOSCOPIC ULTRASOUND (EUS) RADIAL;  Surgeon: Lemar Lofty., MD;  Location: [facility];  Service: Gastroenterology;  Laterality: N/A;   HERNIA REPAIR     as baby   PANCREATIC STENT PLACEMENT  05/02/2022   Procedure: CYSTGASTROSTOMY;  Surgeon: Mansouraty, Netty Starring., MD;  Location: [facility];  Service: Gastroenterology;;   POLYPECTOMY  12/21/2019   Procedure: POLYPECTOMY;  Surgeon: Franky Macho, MD;  Location: [facility];  Service: Gastroenterology;;   pyloric stenosis repair     as baby, operated at [facility] REMOVAL  06/10/2022   Procedure: STENT REMOVAL;  Surgeon: Lemar Lofty., MD;  Location: [facility];  Service: Gastroenterology;;   UMBILICAL HERNIA REPAIR N/A 10/29/2019   Procedure: OPEN UMBILICAL HERNIA REPAIR WITH MESH;  Surgeon: Berna Bue, MD;  [facility];  Service: General;  Laterality: N/A;   XI ROBOTIC ASSISTED INGUINAL HERNIA REPAIR WITH MESH Left 05/13/2022   Procedure: XI ROBOTIC ASSISTED INGUINAL HERNIA REPAIR WITH MESH;  Surgeon: Lewie Chamber, DO;  Location: [facility];  Service: General;  Laterality: Left;    Social History   Socioeconomic History   Marital status: Married    Spouse name: Not on file   Number of children: 2   Years of education: Not on file   Highest education level: Not on file  Occupational History   Occupation: Higher education careers adviser: [employer]    Comment: [employer]  Tobacco Use   Smoking status: Every Day    Current packs/day: 1.50    Average packs/day: 1.5 packs/day for 30.0 years (45.0 ttl pk-yrs)    Types: Cigarettes    Passive exposure:  Current   Smokeless tobacco: Never   Tobacco comments:    1 ppd since 3 weeks as of 10-19-2019  Vaping Use   Vaping status: Never Used  Substance and Sexual Activity   Alcohol use: Not Currently    Comment: quit 05/30/21   Drug use: Not Currently   Sexual activity: Not on file  Other Topics Concern   Not on file  Social History Narrative   Not on file   Social Drivers of Health   Financial Resource Strain: Not on file  Food Insecurity: No Food Insecurity (01/02/2022)   Hunger Vital Sign    Worried About Running Out of Food in the Last Year: Never true    Ran Out of Food in the Last Year: Never true  Transportation Needs: No Transportation Needs (01/02/2022)   PRAPARE - Administrator, Civil Service (Medical): No    Lack of Transportation (Non-Medical): No  Physical Activity: Not on file  Stress: Not on file  Social Connections: Not on file    Family History  Problem Relation Age of Onset   Colon cancer Neg Hx     Outpatient Encounter Medications as of 05/01/2023  Medication Sig   ALPRAZolam (XANAX) 1 MG tablet Take 1 tablet (1 mg total) by mouth 3 (three) times daily.   atorvastatin (LIPITOR) 10 MG tablet Take 1 tablet (10 mg total) by mouth daily.   Cholecalciferol (VITAMIN D) 50 MCG (2000 UT) tablet Take 2,000 Units by mouth 2 (two) times daily.   Continuous Glucose Sensor (FREESTYLE LIBRE 3 PLUS SENSOR) MISC Checking sugar daily. Change sensor every 15 days   dicyclomine (BENTYL) 10 MG capsule Take 1 capsule (10 mg total) by mouth daily as needed for spasms.   diltiazem (CARDIZEM CD) 360 MG 24 hr capsule Take 1 capsule (360 mg total) by mouth every evening.   empagliflozin (JARDIANCE) 10 MG TABS tablet Take 1 tablet (10 mg total) by mouth every morning.   esomeprazole (NEXIUM) 20 MG capsule Take 1 capsule (20 mg total) by mouth daily.   fenofibrate 160 MG tablet Take 1 tablet (160 mg total) by mouth daily.   olmesartan (BENICAR) 20 MG tablet Take 1 tablet  (20 mg total) by mouth daily.   Propylene Glycol (SYSTANE COMPLETE) 0.6 % SOLN Place 1 drop into both eyes daily as needed (dry eyes).   traMADol (ULTRAM) 50 MG tablet Take 100 mg by mouth every 6 (six) hours.   No facility-administered encounter medications on file as of 05/01/2023.    ALLERGIES: No Known Allergies  VACCINATION STATUS: Immunization History  Administered Date(s) Administered   Moderna Sars-Covid-2 Vaccination 04/10/2019, 05/12/2019    Diabetes He presents for his follow-up diabetic visit. He has type 2 diabetes mellitus. Onset time: diagnosed at approx age of 50. His disease course has been improving.  There are no hypoglycemic associated symptoms. Associated symptoms include polyuria. Pertinent negatives for diabetes include no weight loss. There are no hypoglycemic complications. Symptoms are stable. Diabetic complications include nephropathy. (Chronic pancreatitis) Risk factors for coronary artery disease include diabetes mellitus, male sex, hypertension, tobacco exposure, sedentary lifestyle and stress. Current diabetic treatment includes oral agent (monotherapy). He is compliant with treatment most of the time. He is following a low fat/cholesterol, high fiber and generally healthy diet. Meal planning includes avoidance of concentrated sweets. He has not had a previous visit with a dietitian. He rarely participates in exercise. His home blood glucose trend is fluctuating minimally. His overall blood glucose range is 110-130 mg/dl. (He presents today with his CGM showing at goal glycemic profile.  His POCT A1c today is 7.1%, increasing from last visit of 6%.  Analysis of his CGM shows TIR 98%, TAR 2%, TBR 0% with a GMI of 6.2%.  He did have a cold between visits which may be throwing off his recent A1c.) An ACE inhibitor/angiotensin II receptor blocker is being taken. He does not see a podiatrist.Eye exam is not current.     Review of systems  Constitutional: + stable body  weight, current Body mass index is 17.85 kg/m., no fatigue, no subjective hyperthermia, no subjective hypothermia Eyes: no blurry vision, no xerophthalmia ENT: no sore throat, no nodules palpated in throat, no dysphagia/odynophagia, no hoarseness Cardiovascular: no chest pain, no shortness of breath, no palpitations, no leg swelling Respiratory: no cough, no shortness of breath Gastrointestinal: no nausea/vomiting/diarrhea, does have intermittent abdominal pain related to chronic pancreatitis Musculoskeletal: + chronic back pain Skin: no rashes, no hyperemia Neurological: no tremors, no numbness, no tingling, no dizziness Psychiatric: no depression, no anxiety  Objective:     BP 110/60 (BP Location: Left Arm, Patient Position: Sitting, Cuff Size: Large)   Pulse 72   Ht 5\' 2"  (1.575 m)   Wt 97 lb 9.6 oz (44.3 kg)   BMI 17.85 kg/m   Wt Readings from Last 3 Encounters:  05/01/23 97 lb 9.6 oz (44.3 kg)  03/06/23 97 lb 9.6 oz (44.3 kg)  01/30/23 99 lb 3.2 oz (45 kg)     BP Readings from Last 3 Encounters:  05/01/23 110/60  03/06/23 102/61  02/07/23 (!) 95/57     Physical Exam- Limited  Constitutional:  Body mass index is 17.85 kg/m. , not in acute distress, normal state of mind Eyes:  EOMI, no exophthalmos Musculoskeletal: no gross deformities, strength intact in all four extremities, no gross restriction of joint movements Skin:  no rashes, no hyperemia, +slight nicotinic discoloration to fingernails Neurological: no tremor with outstretched hands   Diabetic Foot Exam - Simple   No data filed      CMP ( most recent) CMP     Component Value Date/Time   NA 140 01/07/2023 1107   K 4.3 01/07/2023 1107   CL 103 01/07/2023 1107   CO2 22 01/07/2023 1107   GLUCOSE 126 (H) 01/07/2023 1107   GLUCOSE 92 12/26/2022 1228   BUN 18 01/07/2023 1107   CREATININE 1.04 01/07/2023 1107   CREATININE 1.16 07/25/2021 0907   CALCIUM 10.1 01/07/2023 1107   PROT 6.6 01/07/2023 1107    ALBUMIN 4.3 01/07/2023 1107   AST 29 01/07/2023 1107   ALT 43 01/07/2023 1107   ALKPHOS 62 01/07/2023 1107   BILITOT 0.2 01/07/2023 1107   EGFR 80 01/07/2023 1107   GFRNONAA 56 (L) 12/26/2022 1228     Diabetic Labs (  most recent): Lab Results  Component Value Date   HGBA1C 7.1 (A) 05/01/2023   HGBA1C 6.0 (A) 01/30/2023   HGBA1C 7.0 (H) 05/08/2022     Lipid Panel ( most recent) Lipid Panel     Component Value Date/Time   CHOL 101 01/03/2022 1200   TRIG 113 01/03/2022 1200   HDL 16 (L) 01/03/2022 1200   CHOLHDL 6.3 01/03/2022 1200   VLDL 23 01/03/2022 1200   LDLCALC 62 01/03/2022 1200      No results found for: "TSH", "FREET4"         Assessment & Plan:   1) Type 2 diabetes mellitus with stage 2 chronic kidney disease, without long-term current use of insulin (HCC) (Primary)  He presents today with his CGM showing at goal glycemic profile.  His POCT A1c today is 7.1%, increasing from last visit of 6%.  Analysis of his CGM shows TIR 98%, TAR 2%, TBR 0% with a GMI of 6.2%.  He did have a cold between visits which may be throwing off his recent A1c.  - Kyle Giles has currently uncontrolled symptomatic type 2 DM since 63 years of age.   -Recent labs reviewed.  - I had a long discussion with him about the progressive nature of diabetes and the pathology behind its complications. -his diabetes is complicated by chronic pancreatitis and CKD stage 3 (sees nephrology) and he remains at a high risk for more acute and chronic complications which include CAD, CVA, CKD, retinopathy, and neuropathy. These are all discussed in detail with him.  The following Lifestyle Medicine recommendations according to American College of Lifestyle Medicine [facility]) were discussed and offered to patient and he agrees to start the journey:  A. Whole Foods, Plant-based plate comprising of fruits and vegetables, plant-based proteins, whole-grain carbohydrates was discussed in detail with the  patient.   A list for source of those nutrients were also provided to the patient.  Patient will use only water or unsweetened tea for hydration. B.  The need to stay away from risky substances including alcohol, smoking; obtaining 7 to 9 hours of restorative sleep, at least 150 minutes of moderate intensity exercise weekly, the importance of healthy social connections,  and stress reduction techniques were discussed. C.  A full color page of  Calorie density of various food groups per pound showing examples of each food groups was provided to the patient.  - Nutritional counseling repeated at each appointment due to patients tendency to fall back in to old habits.  - The patient admits there is a room for improvement in their diet and drink choices. -  Suggestion is made for the patient to avoid simple carbohydrates from their diet including Cakes, Sweet Desserts / Pastries, Ice Cream, Soda (diet and regular), Sweet Tea, Candies, Chips, Cookies, Sweet Pastries, Store Bought Juices, Alcohol in Excess of 1-2 drinks a day, Artificial Sweeteners, Coffee Creamer, and "Sugar-free" Products. This will help patient to have stable blood glucose profile and potentially avoid unintended weight gain.   - I encouraged the patient to switch to unprocessed or minimally processed complex starch and increased protein intake (animal or plant source), fruits, and vegetables.   - Patient is advised to stick to a routine mealtimes to eat 3 meals a day and avoid unnecessary snacks (to snack only to correct hypoglycemia).  - I have approached him with the following individualized plan to manage his diabetes and patient agrees:   -He is advised to continue his Jardiance 10  mg po daily (as prescribed by nephrology).  He has done well without his Metformin and even saw improvement in renal and liver function after its discontinuation.  His previous chronic heavy alcohol use may have damaged his pancreas which may need  insulin treatment in the future, however he is well controlled at this time.  -he is encouraged to continue monitoring glucose once daily maybe 2-3 times per week to keep an eye on glucose.  He does not need to monitor multiple times per day due to safe medication regimen.  - Adjustment parameters are given to him for hypo and hyperglycemia in writing.  - he is not a candidate for full dose Metformin due to concurrent renal insufficiency.  - he is not a candidate for incretin therapy given chronic pancreatitis and body habitus with BMI of 18.  - Specific targets for  A1c; LDL, HDL, and Triglycerides were discussed with the patient.  2) Blood Pressure /Hypertension:  his blood pressure is controlled to target.   he is advised to continue his current medications as prescribed by his PCP/nephrologist.  3) Lipids/Hyperlipidemia:    Review of his recent lipid panel from 08/23/22 showed controlled LDL at 97 .  he is advised to continue Atorvastatin 10 mg daily at bedtime.  Side effects and precautions discussed with him.  4)  Weight/Diet:  his Body mass index is 17.85 kg/m.  -   he is NOT a candidate for weight loss.  Exercise, and detailed carbohydrates information provided  -  detailed on discharge instructions.  5) Chronic Care/Health Maintenance: -he is on ACEI/ARB and Statin medications and is encouraged to initiate and continue to follow up with Ophthalmology, Dentist, Podiatrist at least yearly or according to recommendations, and advised to QUIT SMOKING. I have recommended yearly flu vaccine and pneumonia vaccine at least every 5 years; moderate intensity exercise for up to 150 minutes weekly; and sleep for at least 7 hours a day.  - he is advised to maintain close follow up with Assunta Found, MD for primary care needs, as well as his other providers for optimal and coordinated care.    I spent  25  minutes in the care of the patient today including review of labs from CMP, Lipids,  Thyroid Function, Hematology (current and previous including abstractions from other facilities); face-to-face time discussing  his blood glucose readings/logs, discussing hypoglycemia and hyperglycemia episodes and symptoms, medications doses, his options of short and long term treatment based on the latest standards of care / guidelines;  discussion about incorporating lifestyle medicine;  and documenting the encounter. Risk reduction counseling performed per USPSTF guidelines to reduce obesity and cardiovascular risk factors.     Please refer to Patient Instructions for Blood Glucose Monitoring and Insulin/Medications Dosing Guide"  in media tab for additional information. Please  also refer to " Patient Self Inventory" in the Media  tab for reviewed elements of pertinent patient history.  Kyle Giles participated in the discussions, expressed understanding, and voiced agreement with the above plans.  All questions were answered to his satisfaction. he is encouraged to contact clinic should he have any questions or concerns prior to his return visit.     Follow up plan: - Return in about 4 months (around 08/31/2023) for Diabetes F/U with A1c in office, No previsit labs.   Ronny Bacon, [facility] [address] [place], [place] [address] Phone: [phone] Fax: [phone]  05/01/2023, 1:18 PM

## 2023-05-07 ENCOUNTER — Encounter: Payer: Self-pay | Admitting: Gastroenterology

## 2023-05-12 ENCOUNTER — Other Ambulatory Visit (HOSPITAL_COMMUNITY): Payer: Self-pay

## 2023-05-12 DIAGNOSIS — Z1283 Encounter for screening for malignant neoplasm of skin: Secondary | ICD-10-CM | POA: Diagnosis not present

## 2023-05-12 DIAGNOSIS — D485 Neoplasm of uncertain behavior of skin: Secondary | ICD-10-CM | POA: Diagnosis not present

## 2023-05-12 DIAGNOSIS — L821 Other seborrheic keratosis: Secondary | ICD-10-CM | POA: Diagnosis not present

## 2023-05-12 DIAGNOSIS — L820 Inflamed seborrheic keratosis: Secondary | ICD-10-CM | POA: Diagnosis not present

## 2023-05-12 DIAGNOSIS — D225 Melanocytic nevi of trunk: Secondary | ICD-10-CM | POA: Diagnosis not present

## 2023-05-14 ENCOUNTER — Other Ambulatory Visit: Payer: Self-pay

## 2023-05-14 ENCOUNTER — Other Ambulatory Visit (HOSPITAL_COMMUNITY): Payer: Self-pay

## 2023-05-14 MED ORDER — FREESTYLE LIBRE 3 PLUS SENSOR MISC
1 refills | Status: DC
  Filled 2023-05-14: qty 2, 30d supply, fill #0
  Filled 2023-07-09: qty 2, 30d supply, fill #1

## 2023-05-19 ENCOUNTER — Other Ambulatory Visit (HOSPITAL_COMMUNITY): Payer: Self-pay

## 2023-05-22 DIAGNOSIS — D485 Neoplasm of uncertain behavior of skin: Secondary | ICD-10-CM | POA: Diagnosis not present

## 2023-05-22 DIAGNOSIS — D2261 Melanocytic nevi of right upper limb, including shoulder: Secondary | ICD-10-CM | POA: Diagnosis not present

## 2023-05-22 DIAGNOSIS — L98499 Non-pressure chronic ulcer of skin of other sites with unspecified severity: Secondary | ICD-10-CM | POA: Diagnosis not present

## 2023-06-02 ENCOUNTER — Other Ambulatory Visit (HOSPITAL_COMMUNITY): Payer: Self-pay

## 2023-06-09 ENCOUNTER — Other Ambulatory Visit (HOSPITAL_COMMUNITY): Payer: Self-pay

## 2023-06-12 ENCOUNTER — Other Ambulatory Visit: Payer: Self-pay

## 2023-06-12 ENCOUNTER — Other Ambulatory Visit (HOSPITAL_COMMUNITY): Payer: Self-pay

## 2023-06-12 DIAGNOSIS — Z1389 Encounter for screening for other disorder: Secondary | ICD-10-CM | POA: Diagnosis not present

## 2023-06-12 DIAGNOSIS — F17200 Nicotine dependence, unspecified, uncomplicated: Secondary | ICD-10-CM | POA: Diagnosis not present

## 2023-06-12 MED ORDER — ALPRAZOLAM 1 MG PO TABS
1.0000 mg | ORAL_TABLET | Freq: Three times a day (TID) | ORAL | 0 refills | Status: AC
  Filled 2023-06-12 – 2023-06-13 (×2): qty 270, 90d supply, fill #0

## 2023-06-12 MED ORDER — DILTIAZEM HCL ER 180 MG PO CP24
180.0000 mg | ORAL_CAPSULE | Freq: Every day | ORAL | 1 refills | Status: DC
  Filled 2023-06-12: qty 90, 90d supply, fill #0
  Filled 2023-09-01: qty 90, 90d supply, fill #1

## 2023-06-13 ENCOUNTER — Other Ambulatory Visit: Payer: Self-pay

## 2023-06-13 ENCOUNTER — Other Ambulatory Visit (HOSPITAL_BASED_OUTPATIENT_CLINIC_OR_DEPARTMENT_OTHER): Payer: Self-pay

## 2023-06-13 ENCOUNTER — Other Ambulatory Visit (HOSPITAL_COMMUNITY): Payer: Self-pay

## 2023-06-16 ENCOUNTER — Other Ambulatory Visit (HOSPITAL_COMMUNITY): Payer: Self-pay

## 2023-06-23 DIAGNOSIS — L988 Other specified disorders of the skin and subcutaneous tissue: Secondary | ICD-10-CM | POA: Diagnosis not present

## 2023-06-23 DIAGNOSIS — D485 Neoplasm of uncertain behavior of skin: Secondary | ICD-10-CM | POA: Diagnosis not present

## 2023-06-25 ENCOUNTER — Ambulatory Visit: Admitting: Orthopedic Surgery

## 2023-06-25 ENCOUNTER — Other Ambulatory Visit (INDEPENDENT_AMBULATORY_CARE_PROVIDER_SITE_OTHER): Payer: Self-pay

## 2023-06-25 ENCOUNTER — Encounter: Payer: Self-pay | Admitting: Orthopedic Surgery

## 2023-06-25 VITALS — BP 115/67 | HR 88 | Ht 62.0 in | Wt 94.0 lb

## 2023-06-25 DIAGNOSIS — M546 Pain in thoracic spine: Secondary | ICD-10-CM

## 2023-06-25 NOTE — Progress Notes (Signed)
 New Patient Visit  Assessment: Kyle Giles is a 63 y.o. male with the following: 1. Pain in thoracic spine  Plan: Kyle Giles has pain in his upper back.  Pain is primarily between the shoulder blades.  He had some acute onset pain several months ago after some yard work.  Since then, the pain has progressively worsened.  Radiographs are without concerning features.  No anterolisthesis.  Minimal degenerative changes.  Medications have not been effective.  Low concern for issues related to his balance.  Provided reassurance.  He has not worked with therapy.  At this point, I think this is the most reasonable neck step.  He states understanding.  If he works with therapy, and does not have significant improvement, we can consider an MRI.  We can also provide a referral for a spine specialist.  He can consider chiropractic care or massage therapy.  He states understanding.  He will contact the clinic if he wishes to pursue further treatment.  Follow-up: Return if symptoms worsen or fail to improve.  Subjective:  Chief Complaint  Patient presents with   Back Pain    Upper thoracic area after yard work last summer painful with activity and at night     History of Present Illness: Kyle Giles is a 63 y.o. male who presents for evaluation of upper back pain.  He states he has had pain in between his shoulder blades for several months.  He reports doing some yard work last summer, and then starting to have some pain.  The pain has continued to get worse.  He has tried medications.  He cannot take NSAIDs, but is taking tramadol on a regular basis.  He denies numbness and tingling.  No issues with his balance.   Review of Systems: No fevers or chills No numbness or tingling No chest pain No shortness of breath No bowel or bladder dysfunction No GI distress No headaches   Medical History:  Past Medical History:  Diagnosis Date   Anxiety    DM type 2 (diabetes mellitus, type 2)  (HCC)    GERD (gastroesophageal reflux disease)    Heart murmur    saw dr Angelita Bares 06-29-2019 echo done    Hypercholesterolemia    Hypertension    Palpitations    none in a long time as of 10-19-2019   Pancreatitis    S/P colonoscopy 09/2009   Dr. Larrie Po: Moderate diverticulosis throughout colon, otherwise normal    Umbilical hernia     Past Surgical History:  Procedure Laterality Date   BALLOON DILATION N/A 05/02/2022   Procedure: BALLOON DILATION;  Surgeon: Normie Becton., MD;  Location: [facility];  Service: Gastroenterology;  Laterality: N/A;   BIOPSY  05/02/2022   Procedure: BIOPSY;  Surgeon: Brice Campi Albino Alu., MD;  Location: [facility];  Service: Gastroenterology;;   BIOPSY  06/10/2022   Procedure: BIOPSY;  Surgeon: Normie Becton., MD;  Location: [facility];  Service: Gastroenterology;;   COLONOSCOPY N/A 12/21/2019   Procedure: COLONOSCOPY;  Surgeon: Alanda Allegra, MD;  Location: [facility];  Service: Gastroenterology;  Laterality: N/A;   cyst removed from finger  age 73   ESOPHAGOGASTRODUODENOSCOPY (EGD) WITH PROPOFOL  N/A 08/13/2021   Procedure: ESOPHAGOGASTRODUODENOSCOPY (EGD) WITH PROPOFOL ;  Surgeon: Vinetta Greening, DO;  Location: [facility];  Service: Endoscopy;  Laterality: N/A;  1:30pm   ESOPHAGOGASTRODUODENOSCOPY (EGD) WITH PROPOFOL  N/A 05/02/2022   Procedure: ESOPHAGOGASTRODUODENOSCOPY (EGD) WITH PROPOFOL ;  Surgeon: Brice Campi Albino Alu., MD;  Location: [facility];  Service: Gastroenterology;  Laterality: N/A;   ESOPHAGOGASTRODUODENOSCOPY (EGD) WITH PROPOFOL  N/A 06/10/2022   Procedure: ESOPHAGOGASTRODUODENOSCOPY (EGD) WITH PROPOFOL ;  Surgeon: Brice Campi Albino Alu., MD;  Location: [facility];  Service: Gastroenterology;  Laterality: N/A;   EUS N/A 05/02/2022   Procedure: UPPER ENDOSCOPIC ULTRASOUND (EUS) RADIAL;  Surgeon: Normie Becton., MD;  Location: [facility];  Service: Gastroenterology;  Laterality: N/A;   HERNIA  REPAIR     as baby   PANCREATIC STENT PLACEMENT  05/02/2022   Procedure: CYSTGASTROSTOMY;  Surgeon: Mansouraty, Albino Alu., MD;  Location: [facility];  Service: Gastroenterology;;   POLYPECTOMY  12/21/2019   Procedure: POLYPECTOMY;  Surgeon: Alanda Allegra, MD;  Location: [facility];  Service: Gastroenterology;;   pyloric stenosis repair     as baby, operated at [facility] REMOVAL  06/10/2022   Procedure: STENT REMOVAL;  Surgeon: Normie Becton., MD;  Location: [facility];  Service: Gastroenterology;;   UMBILICAL HERNIA REPAIR N/A 10/29/2019   Procedure: OPEN UMBILICAL HERNIA REPAIR WITH MESH;  Surgeon: Adalberto Acton, MD;  Location: [facility] [place];  Service: General;  Laterality: N/A;   XI ROBOTIC ASSISTED INGUINAL HERNIA REPAIR WITH MESH Left 05/13/2022   Procedure: XI ROBOTIC ASSISTED INGUINAL HERNIA REPAIR WITH MESH;  Surgeon: Marijo Shove, DO;  Location: [facility];  Service: General;  Laterality: Left;    Family History  Problem Relation Age of Onset   Colon cancer Neg Hx    Social History   Tobacco Use   Smoking status: Every Day    Current packs/day: 1.50    Average packs/day: 1.5 packs/day for 30.0 years (45.0 ttl pk-yrs)    Types: Cigarettes    Passive exposure: Current   Smokeless tobacco: Never   Tobacco comments:    1 ppd since 3 weeks as of 10-19-2019  Vaping Use   Vaping status: Never Used  Substance Use Topics   Alcohol use: Not Currently    Comment: quit 05/30/21   Drug use: Not Currently    No Known Allergies  Current Meds  Medication Sig   ALPRAZolam  (XANAX ) 1 MG tablet Take 1 tablet (1 mg total) by mouth 3 (three) times daily.   ALPRAZolam  (XANAX ) 1 MG tablet Take 1 tablet (1 mg total) by mouth 3 (three) times daily.   atorvastatin  (LIPITOR) 10 MG tablet Take 1 tablet (10 mg total) by mouth daily.   Cholecalciferol (VITAMIN D ) 50 MCG (2000 UT) tablet Take 2,000 Units by mouth 2 (two) times daily.   Continuous Glucose  Sensor (FREESTYLE LIBRE 3 PLUS SENSOR) MISC Checking sugar daily. Change sensor every 15 days   dicyclomine  (BENTYL ) 10 MG capsule Take 1 capsule (10 mg total) by mouth daily as needed for spasms.   diltiazem  (DILACOR XR ) 180 MG 24 hr capsule Take 1 capsule (180 mg total) by mouth daily.   empagliflozin  (JARDIANCE ) 10 MG TABS tablet Take 1 tablet (10 mg total) by mouth every morning.   esomeprazole  (NEXIUM ) 20 MG capsule Take 1 capsule (20 mg total) by mouth daily.   fenofibrate  160 MG tablet Take 1 tablet (160 mg total) by mouth daily.   olmesartan  (BENICAR ) 20 MG tablet Take 1 tablet (20 mg total) by mouth daily.   Propylene Glycol (SYSTANE COMPLETE) 0.6 % SOLN Place 1 drop into both eyes daily as needed (dry eyes).   traMADol (ULTRAM) 50 MG tablet Take 100 mg by mouth every 6 (six) hours.    Objective: BP  115/67   Pulse 88   Ht 5\' 2"  (1.575 m)   Wt 94 lb (42.6 kg)   BMI 17.19 kg/m   Physical Exam:  General: Alert and oriented. and No acute distress. Gait: Normal gait.  Evaluation of the back demonstrates no deformity.  No redness.  No swelling.  Mild tenderness to palpation in the mid back area.  He has full range of motion and strength of bilateral upper extremities.  Sensation is intact in both the left and right hand.  Bilateral lower extremities with excellent strength.  No numbness or tingling.  Sensation intact in all dermatomes.  IMAGING: I personally ordered and reviewed the following images   AP and lateral views of the thoracic spine were obtained in clinic today.  No acute injuries.  Well-maintained disc height.  No evidence of a chronic injury.  No anterolisthesis.  No bony lesions.  Impression: Negative thoracic spine x-rays   New Medications:  No orders of the defined types were placed in this encounter.     Tonita Frater, MD  06/25/2023 2:03 PM

## 2023-07-07 NOTE — Progress Notes (Signed)
 GI Office Note    Referring Provider: Minus Amel, MD Primary Care Physician:  Minus Amel, MD Primary Gastroenterologist: Rolando Cliche. Kyle April, DO  Date:  07/08/2023  ID:  Kyle Giles, DOB [birth year], MRN [id number]  Chief Complaint   Chief Complaint  Patient presents with   Follow-up    Follow up. Needs to talk about some changes in health.    History of Present Illness  Kyle Giles is a 63 y.o. male with a history of chronic pancreatitis c/b pseudocyst and stent placement, hepatic steatosis, anxiety, HTN, and diabetes  presenting today to discuss some changes in his health and follow-up on chronic pancreatitis, reflux, and weight loss.  Colonoscopy in 2021 by Dr. Larrie Po which showed 1 hyperplastic polyp.   Hospital admission in March 2023 for acute pancreatitis.  Felt to be alcohol induced as patient was simply drinking with 6 beers as well as liquor daily.  CT imaging showed fatty liver and with mention of duodenitis likely secondary to his pancreatitis.  Denied NSAID use.Kyle Giles  Has some abnormal LFTs during hospitalization.  Viral hepatitis panel negative.  Improved shortly with conservative measures and was discharged home.  Recovered well but then started having recurrent epigastric pain which prompted an ER visit in Giles 2023 which she was discharged home.  He had began taking Nexium  daily.   EGD July 2023: -Normal esophagus, stomach, and duodenum -Continue Nexium  20 mg daily   Hospitalized 01/02/2022 - 01/05/22 for acute on chronic pancreatitis. Presenting with 4 days of abdominal pain and advised to proceed to the hospital with recommendation of his PCP regarding high lipase and AKI.  He denied any recent medication changes.  Prior workup with normal triglycerides and calcium .  At this time he also had acute kidney injury.  Creatinine 3.6 on admission.  Likely due to dehydration.  CT imaging as outlined below.  Advised to start taking pancreatic enzymes once able to tolerate  solids (Creon 48,000 units with meals and 24,000 units with snacks).  Also recommended complete alcohol cessation.  Outpatient follow-up in 4 weeks.  Dedicated pancreatic CT in 6-8 weeks.   CT A/P 01/02/2022: -5.7 cm cystic lesion in tail of pancreas likely pseudocyst -Peripancreatic fluid seen -Peripancreatic fluid and stranding extending from the pseudocyst to the lesser curvature of the stomach -No free air to suggest perforation -Mild prominence of main pancreatic duct increased from prior now measuring approximately 5 mm -Coarse calcifications in the pancreatic head -Hepatobiliary system within normal limits -Indistinctness of the wall of the lesser curvature of the stomach which appears, confluent with peripancreatic stranding. -Extensive colonic diverticulosis without diverticulitis   Patient seen in the office 02/13/2022 and 03/05/2022.  At the beginning of January his GERD was fairly well-controlled.  Was having significant major fatigue and falling asleep during the day.  Recently resigned from his job.  Had a good appetite and no weight loss.  Also not experiencing any diarrhea, primarily having constipation using MiraLAX .  Denied any melena or BRBPR.  Had occasional abdominal pain but also having a lot more gas.  Occasional NSAID use.  Denied any pica.  Taking vitamin D .  Advised to check CBC, iron panel, B12, folate, and vitamin A, D, E and K.  He was scheduled for CT pancreatic protocol.  Refilled Nexium .  Advised to continue MiraLAX  as needed.  At the end of January patient reported having tenderness vomiting a CT scan then had more extensive severe pain that night at dinner.  Reducing to  a blander diet was helping.  Had a couple days without pain but then pain returned intermittently.  He denies any nausea or vomiting and has been drinking more water .  Also complaining of pain to his left groin area stating a possible hernia that had grown in size.  On exam he did have a golf ball sized  soft tissue mass that was nontender.  He was referred to general surgery to discuss hernia repair.  He was referred to Dr. Brice Campi for EUS and possible cyst gastrostomy.  As stated below MRI/MRCP ordered per his recommendation.   CT A/P with and without contrast 02/26/22: -Interval enlargement of unilocular cystic lesion along the body of the pancreas most consistent with pancreatic pseudocyst -No acute pancreatic inflammation -Ductus divisum variant ductal anatomy with calcification at the orifice of the accessory duct.  No significant ductal dilation currently.   Per Dr. Brice Campi fluid collection appears to be large enough to drain which can be done via endoscopic ultrasound  given his pain returning this would likely be best. He has some pancreatic anatomy which can put him at risk for recurrent pancreatitis (this is called pancreatic divisum) and further evaluation can be performed with an MRI/MRCP that would allow a better look at his anatomy for Dr. Brice Campi to discuss possible treatment options.    MRI/MRCP 03/27/2022: -Decrease size of 6.2 cm pseudocyst adjacent to pancreatic tail -Findings of chronic pancreatitis and pancreas divisum -No evidence of acute pancreatic inflammation. -Advised to keep follow-up with EUS with Dr. Brice Campi already planned for the end of March   OV 04/10/22.  Continued intermittent epigastric pain as well as left lower quadrant/inguinal pain.  Has a good appetite.  Symptoms better controlled without overeating.  Still with some weight loss and occasional constipation but no diarrhea.  Continue on Nexium  once daily with some intermittent reflux symptoms.  No alcohol use or NSAIDs.  Advised to keep follow-up with surgery for inguinal hernia repair.  Follow low-fat diet.  Continue PPI once daily.  Focus on protein intake for nutrition.  Monitor weight.   EGD/EUS 05/02/22 EGD impression: - No gross lesions in the entire esophagus. Z- line regular, 40 cm from the  incisors. - Erythematous mucosa in the gastric body. No other gross lesions in the entire stomach. Biopsied. - No gross lesions in the duodenal bulb. - Duodenal deformity in the duodenal sweep ( as noted above query healed groove pancreatitis versus healed ulcer) - Normal major papilla. - Congested duodenal mucosa in the region of the minor papilla ( an area suggestive of a pancreatic duct was noted but the amount of swelling in this region makes finding of the true minor papilla difficult)   EUS impression: - A cystic lesion was seen in the pancreatic body and pancreatic tail. Tissue has not been obtained. However, the endosonographic appearance is consistent with a pancreatic pseudocyst. Cystgastrostomy created ( 15 mm AXIOS with 7 [place] by 5 cm double- pigtail through it) . - The pancreatic duct had a dilated endosonographic appearance in the pancreatic head and genu of the pancreas. The pancreatic duct measured up to 4 mm in diameter. - Findings suggestive of pancreatic stones were identified in the pancreatic head and accessory pancreatic duct. It was difficult to evaluate the rest of the pancreatic duct as a result of the pseudocyst. - Pancreatic parenchymal abnormalities consisting of lobularity and hyperechoic strands were noted in the pancreatic head and genu of the pancreas. It was difficult to evaluate the rest of  the pancreas parenchyma as a result of the pseudocyst. - Hyperechoic material consistent with sludge was visualized endosonographically in the gallbladder. - No malignant- appearing lymph nodes were visualized in the celiac region ( level 20) , peripancreatic region and porta hepatis region.   Left inguinal hernia repair on 05/13/22.    CT abdomen with contrast 05/31/22: IMPRESSION: 1. Sequela of prior pancreatitis with interval placement of cystogastrostomy stent with resolution of the previously noted pseudocyst in the pancreatic body/tail. 2. Peripheral subcentimeter hypodensity  measuring 8 mm in the pancreatic head is new from 03/27/2022 and may represent a side branch intraductal papillary mucinous neoplasms (IPMN) or small pseudocyst. Consider contrast-enhanced MRI/MRCP in 1 year.   Repeat EGD 06/10/22 with Dr. Brice Campi: -salmon colored mucosa to islands suspicious or barrett's s/p biopsy.  -non obstructing schatzki ring -2 cm hiatal hernia -Pre-existing Axios cystogastrostomy stent was noted and removed -Mild gastritis not rebiopsied -Normal duodenum -Recommended follow-up in the clinic to discuss several symptoms with repeat MRI/MRCP in 3-4 months.    KUB 06/12/22: -cystogastrostomy stents no longer seen -chronic calcific pancreatitis   OV 07/03/2022.  Reportedly feeling pretty well compared to prior, his fatigue has improved as well.  He was back doing yard work but stamina was slowly increasing.  He had quit taking Nexium  in March and denied any recent issues with reflux.  Does keep Mylanta at home and had only taken a handful of times.  Had some difficulty with healing from inguinal hernia but overall doing very well.  Denied any nausea, vomiting, or abdominal pain.  Had been using MiraLAX  as needed for constipation.  Advised MRI/MRCP in August 2024 to reevaluate pancreatic abnormality and discuss treatment for pancreatic divisum.  Advised to continue alcohol cessation and low-fat diet.   ED visit 12/26/2022 for abdominal pain.  He was noted to have abdominal tenderness on exam.  Labs revealed a lipase of 107, AST 56, ALT 74, creatinine 1.41, BUN 25, hemoglobin 11.2, platelets 427, UA with glucose and ketones present.  Underwent CT scan as noted below.  He was treated with oxycodone  and advised to clear liquid diet for several days and advised to follow-up with GI.   CT A/P with contrast: -Acute pancreatitis with marked edema and peripancreatic fat stranding with head and body of pancreas -Chronic subcentimeter pseudocyst or IPMN within the inflamed pancreatic head  again noted and unchanged -No new organized fluid collection to suggest abscess or acute pseudocyst -Diffuse colonic diverticulosis without diverticulitis   OV 12/31/22.  Reported some mild constipation and was taking MiraLAX  initially but then switched to Senokot.  Was taking Mylanta only as needed for reflux feeling like his reflux was more controlled.  Noted slow healing from his most recent pancreatitis episode, had been trying to follow necessary diet recommendations.  Discussed continuing with his MRI/MRCP that was coming up.  We discussed need for pancreatic enzymes if he were to continue to have recurrent pancreatitis.   MRI/MRCP 01/31/2023 IMPRESSION: -Chronic pancreatitis, with resolution of previously seen large pseudocyst in the pancreatic tail. No radiographic signs of acute pancreatitis. - Pancreas divisum again noted. -New 2.3 cm and 1.0 cm simple cystic lesions in the pancreatic neck and head, consistent with pseudocysts. Recommend continued follow-up by MRI in 6 months.  Last office visit 03/06/23.  Discussed increasing protein and calorie intake for weight gain.  Advised to schedule MRI in June.  Encouraged adequate hydration and continue Nexium  20 mg once daily.  We discussed pancreatic enzyme therapy and advised  that we should consider this if he was unable to increase his weight with high-protein/calorie diet.  Following with endocrine - last visit 3/27. A1c elevated at 7.1. Noted improvement in kidney and liver function since discontinuation fo metformin . Maintained on Jardiance . Was provided nutrition counseling.   MRI/MRCP not scheduled.   Today:  States May was not very good, end of Giles begin to notice that he was having lots of belching and more flatulence and when he bleached he was having pain near the diaphragm and a little bit of hearrbutrn. Belching and flatulence is more bothersome to him than the indigestion. Is worse at night. At times though he was eating too  heavy or greasy foods and tried to substitute that.   Has modified his diet and cut some of that out. For 3 days he went to a bland diet to help with that and then states that he lost 1.5 lbs that he had gained. He had been taking a whey powder twice daily. Tried a higher calorie protein but felt very bloated with this. When he starts belching he states it is more noticeable and is embarrassing. Hurts more under the sternum. Mylanta does help. Normally eats dinner at 5/6 pm and he goes to bed about 12/1am. Was doing some light snacking - something like a handful of fish cracker or peanuts and then go back to what he was doing.   Has had 3 incidents that he felt like there was a clench in the epigastric region and gets nauseas and it lasts less tan 2 minutes and then passes. No vomiting.   He has noticed if he eats since cream frequently throughout the week that causes him some upset. Has since cut that out completely. For 6 months has eaten greek yogurt for breakfast and makde him feel very full - has switched to oatmeal in the morning with fruit and/ or banana with prtotein shakes. Lunch is leftover dinner. Only stopped protein shakes for 1 week.   Watching what he eats has helped some   Stools now are bristol 4 and much more normal.   Not doing lots of physical activity - sitting and pulling weeds and riding on mower and having back pain - went to ortho and did a back xray and said he did not have arthritis - was not back specialist. Gest worse at 7-8 pm and as his back hurts he starts to have pain in the LUQ under the rib cage. Back pain is between shoulder pain and just worse to the left side if the spine. At times will be so bad he almost freezes up.   Thinks he may have another hernia on the right side - not able to feel it per say but has had 4 episodes that it has popped up and had to lay down before the pain eases off. Thinks he possibly could've lifted something to heavy with groceries and  has discomfort after this episode.   Wt Readings from Last 3 Encounters:  07/08/23 92 lb 3.2 oz (41.8 kg)  06/25/23 94 lb (42.6 kg)  05/01/23 97 lb 9.6 oz (44.3 kg)    Current Outpatient Medications  Medication Sig Dispense Refill   ALPRAZolam  (XANAX ) 1 MG tablet Take 1 tablet (1 mg total) by mouth 3 (three) times daily. 270 tablet 0   ALPRAZolam  (XANAX ) 1 MG tablet Take 1 tablet (1 mg total) by mouth 3 (three) times daily. 270 tablet 0   atorvastatin  (LIPITOR) 10 MG tablet  Take 1 tablet (10 mg total) by mouth daily. 90 tablet 3   Cholecalciferol (VITAMIN D ) 50 MCG (2000 UT) tablet Take 2,000 Units by mouth 2 (two) times daily.     Continuous Glucose Sensor (FREESTYLE LIBRE 3 PLUS SENSOR) MISC Checking sugar daily. Change sensor every 15 days 2 each 1   dicyclomine  (BENTYL ) 10 MG capsule Take 1 capsule (10 mg total) by mouth daily as needed for spasms. 30 capsule 1   diltiazem  (DILACOR XR ) 180 MG 24 hr capsule Take 1 capsule (180 mg total) by mouth daily. 90 capsule 1   empagliflozin  (JARDIANCE ) 10 MG TABS tablet Take 1 tablet (10 mg total) by mouth every morning. 30 tablet 11   esomeprazole  (NEXIUM ) 20 MG capsule Take 1 capsule (20 mg total) by mouth daily. 30 capsule 11   fenofibrate  160 MG tablet Take 1 tablet (160 mg total) by mouth daily. 90 tablet 2   olmesartan  (BENICAR ) 20 MG tablet Take 1 tablet (20 mg total) by mouth daily. 30 tablet 11   Propylene Glycol (SYSTANE COMPLETE) 0.6 % SOLN Place 1 drop into both eyes daily as needed (dry eyes).     traMADol (ULTRAM) 50 MG tablet Take 100 mg by mouth every 6 (six) hours.     No current facility-administered medications for this visit.    Past Medical History:  Diagnosis Date   Anxiety    DM type 2 (diabetes mellitus, type 2) (HCC)    GERD (gastroesophageal reflux disease)    Heart murmur    saw dr Angelita Bares 06-29-2019 echo done    Hypercholesterolemia    Hypertension    Palpitations    none in a long time as of 10-19-2019    Pancreatitis    S/P colonoscopy 09/2009   Dr. Larrie Po: Moderate diverticulosis throughout colon, otherwise normal    Umbilical hernia     Past Surgical History:  Procedure Laterality Date   BALLOON DILATION N/A 05/02/2022   Procedure: BALLOON DILATION;  Surgeon: Normie Becton., MD;  Location: [facility];  Service: Gastroenterology;  Laterality: N/A;   BIOPSY  05/02/2022   Procedure: BIOPSY;  Surgeon: Brice Campi Albino Alu., MD;  Location: [facility];  Service: Gastroenterology;;   BIOPSY  06/10/2022   Procedure: BIOPSY;  Surgeon: Normie Becton., MD;  Location: [facility];  Service: Gastroenterology;;   COLONOSCOPY N/A 12/21/2019   Procedure: COLONOSCOPY;  Surgeon: Alanda Allegra, MD;  Location: [facility];  Service: Gastroenterology;  Laterality: N/A;   cyst removed from finger  age 27   ESOPHAGOGASTRODUODENOSCOPY (EGD) WITH PROPOFOL  N/A 08/13/2021   Procedure: ESOPHAGOGASTRODUODENOSCOPY (EGD) WITH PROPOFOL ;  Surgeon: Vinetta Greening, DO;  Location: [facility];  Service: Endoscopy;  Laterality: N/A;  1:30pm   ESOPHAGOGASTRODUODENOSCOPY (EGD) WITH PROPOFOL  N/A 05/02/2022   Procedure: ESOPHAGOGASTRODUODENOSCOPY (EGD) WITH PROPOFOL ;  Surgeon: Brice Campi Albino Alu., MD;  Location: [facility];  Service: Gastroenterology;  Laterality: N/A;   ESOPHAGOGASTRODUODENOSCOPY (EGD) WITH PROPOFOL  N/A 06/10/2022   Procedure: ESOPHAGOGASTRODUODENOSCOPY (EGD) WITH PROPOFOL ;  Surgeon: Brice Campi Albino Alu., MD;  Location: [facility];  Service: Gastroenterology;  Laterality: N/A;   EUS N/A 05/02/2022   Procedure: UPPER ENDOSCOPIC ULTRASOUND (EUS) RADIAL;  Surgeon: Normie Becton., MD;  Location: [facility];  Service: Gastroenterology;  Laterality: N/A;   HERNIA REPAIR     as baby   PANCREATIC STENT PLACEMENT  05/02/2022   Procedure: CYSTGASTROSTOMY;  Surgeon: Mansouraty, Albino Alu., MD;  Location: [facility];  Service: Gastroenterology;;   POLYPECTOMY  12/21/2019  Procedure: POLYPECTOMY;  Surgeon: Alanda Allegra, MD;  Location: [facility];  Service: Gastroenterology;;   pyloric stenosis repair     as baby, operated at [facility] REMOVAL  06/10/2022   Procedure: STENT REMOVAL;  Surgeon: Normie Becton., MD;  Location: [facility];  Service: Gastroenterology;;   UMBILICAL HERNIA REPAIR N/A 10/29/2019   Procedure: OPEN UMBILICAL HERNIA REPAIR WITH MESH;  Surgeon: Adalberto Acton, MD;  Location: [facility];  Service: General;  Laterality: N/A;   XI ROBOTIC ASSISTED INGUINAL HERNIA REPAIR WITH MESH Left 05/13/2022   Procedure: XI ROBOTIC ASSISTED INGUINAL HERNIA REPAIR WITH MESH;  Surgeon: Marijo Shove, DO;  Location: [facility];  Service: General;  Laterality: Left;    Family History  Problem Relation Age of Onset   Colon cancer Neg Hx     Allergies as of 07/08/2023   (No Known Allergies)    Social History   Socioeconomic History   Marital status: Married    Spouse name: Not on file   Number of children: 2   Years of education: Not on file   Highest education level: Not on file  Occupational History   Occupation: Higher education careers adviser: [employer]    Comment: [employer]  Tobacco Use   Smoking status: Every Day    Current packs/day: 1.50    Average packs/day: 1.5 packs/day for 30.0 years (45.0 ttl pk-yrs)    Types: Cigarettes    Passive exposure: Current   Smokeless tobacco: Never   Tobacco comments:    1 ppd since 3 weeks as of 10-19-2019  Vaping Use   Vaping status: Never Used  Substance and Sexual Activity   Alcohol use: Not Currently    Comment: quit 05/30/21   Drug use: Not Currently   Sexual activity: Not on file  Other Topics Concern   Not on file  Social History Narrative   Not on file   Social Drivers of Health   Financial Resource Strain: Not on file  Food Insecurity: No Food Insecurity (01/02/2022)   Hunger Vital Sign    Worried About Running Out of Food in the  Last Year: Never true    Ran Out of Food in the Last Year: Never true  Transportation Needs: No Transportation Needs (01/02/2022)   PRAPARE - Administrator, Civil Service (Medical): No    Lack of Transportation (Non-Medical): No  Physical Activity: Not on file  Stress: Not on file  Social Connections: Not on file     Review of Systems   Gen: Denies fever, chills, anorexia. Denies fatigue, weakness, weight loss.  CV: Denies chest pain, palpitations, syncope, peripheral edema, and claudication. Resp: Denies dyspnea at rest, cough, wheezing, coughing up blood, and pleurisy. GI: See HPI Derm: Denies rash, itching, dry skin Psych: Denies depression, anxiety, memory loss, confusion. No homicidal or suicidal ideation.  Heme: Denies bruising, bleeding, and enlarged lymph nodes.  Physical Exam   BP (!) 99/59 (BP Location: Right Arm, Patient Position: Sitting, Cuff Size: Normal)   Pulse 87   Temp 97.6 F (36.4 C) (Temporal)   Ht 5\' 2"  (1.575 m)   Wt 92 lb 3.2 oz (41.8 kg)   BMI 16.86 kg/m   General:   Alert and oriented. No distress noted. Pleasant and cooperative.  Thin appearing. Head:  Normocephalic and atraumatic. Eyes:  Conjuctiva clear without scleral icterus. Rectal: deferred Msk:  Symmetrical without gross deformities. Normal posture.  Decreased muscle mass. Extremities:  Without edema. Neurologic:  Alert and  oriented x4 Psych:  Alert and cooperative. Normal mood and affect.  Assessment  Kyle Giles is a 63 y.o. male with a history of chronic pancreatitis c/b pseudocyst and stent placement, hepatic steatosis, anxiety, HTN, and diabetes, recently diagnosed CKD presenting today with concerns regarding his health.   Chronic pancreatitis, pancreatic pseudocyst, pancreas divisum: - Multiple prior hospitalizations for pancreatitis in 2023 and in 2024.  Etiology secondary to pancreas divisum but more likely alcohol use. -Course complicated by large pseudocyst  requiring EUS with placement of cyst gastrostomy, evidence of pancreas divisum, and large pseudocyst development. - Prior MRI in December with evidence of 2 small pseudocyst/cystic lesions within the neck and head of the pancreas and recommended 30-month follow-up. - Continuing to have issues with weight gain despite increasing calorie and protein diet.  Initially he gained about a pound and a half however he began having some recurrent upper GI symptoms and lost this 1.5 pounds quickly. - Discussed significantly in detail today the reasoning behind pancreatic enzymes and the importance of weight gain as he is likely not gaining weight secondary to malabsorption. - Encouraged ongoing high-protein and high-calorie diet - Educated on reasoning behind follow-up MRI/MRCP, agreeable to proceed with scheduling. - Counseled on how to take Zenpep.  Will trial 40,000 units 1 capsule with meals and snacks.  Low BMI, weight loss: - Weight 92 pounds today, has gotten up to about 98 pounds at home - Was doing well initially with high-protein/high-calorie diet with significant supplementation as noted in HPI. - Given the degree of chronic pancreatitis, absorption is the likely cause of his prior weight loss and difficulties with weight gain - Discussed that he may need to consider following with registered dietitian to help with any pointers for high-protein/high-calorie diet and give good recommended amounts of protein and calorie goal. - Has been following with endocrinology as well given his diabetes and they have been providing some nutritional education for him as well.  Has had decent control of his blood sugars as of late.  GERD, belching: - Symptoms previously well-controlled with Nexium  20 mg once daily however more recently has been having increased belching and some discomfort in the epigastric region - Symptoms can be fairly severe at times but usually short lasting. - Previous gastritis on EGD. -  Will increase Nexium  from 20 to 40 mg and monitor for improvement - Advised that some of the stomach discomfort could be secondary to lactose intolerance therefore also advised Lactaid with dairy consumption.  We also discussed his upper back pain as well as concerns about a right sided inguinal hernia.  Encouraged him to follow-up with PCP in regards to potentially considering physical therapy for his back pain.  Since we are obtaining an MRI of his abdomen to follow-up on his chronic pancreatitis and cystic lesions, we will also check MRI with pelvis to assess for right-sided inguinal hernia.  PLAN   MRI/MRCP  Weight gain - high protein diet Avoid alcohol Nexium  - increase to 40 mg nightly Mylanta as needed.  Lactaid tablets Progress report in 2 weeks in regards to increase Nexium , Lactaid, and Zenpep. Trial Zenpep 40K unit capsules - 1 with meals.  Discussed this in detail today to see if this helps improve absorption and help with weight gain. Follow up in 3 months.   Greater than 50% of the 45-minute visit was spent on patient education.  Julian Obey, MSN, FNP-BC, AGACNP-BC  [facility]

## 2023-07-08 ENCOUNTER — Encounter: Payer: Self-pay | Admitting: *Deleted

## 2023-07-08 ENCOUNTER — Ambulatory Visit: Admitting: Gastroenterology

## 2023-07-08 ENCOUNTER — Encounter: Payer: Self-pay | Admitting: Gastroenterology

## 2023-07-08 VITALS — BP 99/59 | HR 87 | Temp 97.6°F | Ht 62.0 in | Wt 92.2 lb

## 2023-07-08 DIAGNOSIS — K861 Other chronic pancreatitis: Secondary | ICD-10-CM

## 2023-07-08 DIAGNOSIS — K4090 Unilateral inguinal hernia, without obstruction or gangrene, not specified as recurrent: Secondary | ICD-10-CM

## 2023-07-08 DIAGNOSIS — Q453 Other congenital malformations of pancreas and pancreatic duct: Secondary | ICD-10-CM | POA: Diagnosis not present

## 2023-07-08 DIAGNOSIS — Z681 Body mass index (BMI) 19 or less, adult: Secondary | ICD-10-CM

## 2023-07-08 DIAGNOSIS — K219 Gastro-esophageal reflux disease without esophagitis: Secondary | ICD-10-CM

## 2023-07-08 DIAGNOSIS — R1013 Epigastric pain: Secondary | ICD-10-CM

## 2023-07-08 DIAGNOSIS — K863 Pseudocyst of pancreas: Secondary | ICD-10-CM

## 2023-07-08 DIAGNOSIS — R634 Abnormal weight loss: Secondary | ICD-10-CM

## 2023-07-08 DIAGNOSIS — R110 Nausea: Secondary | ICD-10-CM | POA: Diagnosis not present

## 2023-07-08 DIAGNOSIS — R142 Eructation: Secondary | ICD-10-CM | POA: Diagnosis not present

## 2023-07-08 NOTE — Patient Instructions (Addendum)
 Pancreas: We will get you scheduled for your MRI of your pancreas Continue to follow a high-protein/high-calorie diet -if you would like to see a registered dietitian please let me know I will be happy to give you a referral I am providing you some samples of Zenpep today.  You will take 1 capsule with meals.  Importantly take with first bite.  Please let me know if you experience any significant constipation with this. Continue avoidance of NSAIDs and alcohol  For bloating, nausea, reflux: Continue Nexium  but increase to 40 mg prior to dinner.  You will take 2 of your 20 mg capsules. Call/message with progress report with increase Nexium  in 2 weeks.  If it is helpful I will update your prescription to higher dose. Continue Mylanta as needed for breakthrough Some of your symptoms could be lactose intolerance.  Recommend a Lactaid tablet over-the-counter anytime you consume dairy.  Generic works just as well as brand-name Lactaid.   Back pain/hernia: If hernia symptoms become more frequent please let me know if you end up needing a surgery referral. I would discussed with Dr. Glady Laming about physical therapy for your back or you may consider massage therapy prior to going that route.  Follow-up in 3 months.  It was a pleasure to see you today. I want to create trusting relationships with patients. If you receive a survey regarding your visit,  I greatly appreciate you taking time to fill this out on paper or through your MyChart. I value your feedback.  Julian Obey, MSN, FNP-BC, AGACNP-BC [facility]

## 2023-07-09 ENCOUNTER — Other Ambulatory Visit: Payer: Self-pay

## 2023-07-09 ENCOUNTER — Other Ambulatory Visit (HOSPITAL_COMMUNITY): Payer: Self-pay

## 2023-07-15 ENCOUNTER — Other Ambulatory Visit: Payer: Self-pay | Admitting: Gastroenterology

## 2023-07-15 ENCOUNTER — Ambulatory Visit (HOSPITAL_COMMUNITY)
Admission: RE | Admit: 2023-07-15 | Discharge: 2023-07-15 | Disposition: A | Discharge status: Home / Self Care | Source: Ambulatory Visit | Attending: Gastroenterology

## 2023-07-15 ENCOUNTER — Ambulatory Visit (HOSPITAL_COMMUNITY)
Admission: RE | Admit: 2023-07-15 | Discharge: 2023-07-15 | Disposition: A | Discharge status: Home / Self Care | Source: Ambulatory Visit | Attending: Gastroenterology | Admitting: Gastroenterology

## 2023-07-15 DIAGNOSIS — Q453 Other congenital malformations of pancreas and pancreatic duct: Secondary | ICD-10-CM | POA: Insufficient documentation

## 2023-07-15 DIAGNOSIS — R1013 Epigastric pain: Secondary | ICD-10-CM | POA: Diagnosis not present

## 2023-07-15 DIAGNOSIS — K863 Pseudocyst of pancreas: Secondary | ICD-10-CM | POA: Diagnosis not present

## 2023-07-15 DIAGNOSIS — K219 Gastro-esophageal reflux disease without esophagitis: Secondary | ICD-10-CM

## 2023-07-15 DIAGNOSIS — K4090 Unilateral inguinal hernia, without obstruction or gangrene, not specified as recurrent: Secondary | ICD-10-CM | POA: Insufficient documentation

## 2023-07-15 DIAGNOSIS — Z681 Body mass index (BMI) 19 or less, adult: Secondary | ICD-10-CM

## 2023-07-15 DIAGNOSIS — R142 Eructation: Secondary | ICD-10-CM | POA: Diagnosis not present

## 2023-07-15 DIAGNOSIS — K861 Other chronic pancreatitis: Secondary | ICD-10-CM

## 2023-07-15 DIAGNOSIS — K8689 Other specified diseases of pancreas: Secondary | ICD-10-CM | POA: Diagnosis not present

## 2023-07-15 DIAGNOSIS — K5730 Diverticulosis of large intestine without perforation or abscess without bleeding: Secondary | ICD-10-CM | POA: Diagnosis not present

## 2023-07-15 DIAGNOSIS — R110 Nausea: Secondary | ICD-10-CM

## 2023-07-15 DIAGNOSIS — K859 Acute pancreatitis without necrosis or infection, unspecified: Secondary | ICD-10-CM | POA: Diagnosis not present

## 2023-07-15 MED ORDER — GADOBUTROL 1 MMOL/ML IV SOLN
5.0000 mL | Freq: Once | INTRAVENOUS | Status: AC | PRN
  Administered 2023-07-15: 5 mL via INTRAVENOUS

## 2023-07-18 ENCOUNTER — Ambulatory Visit: Payer: Self-pay | Admitting: Gastroenterology

## 2023-07-22 ENCOUNTER — Other Ambulatory Visit: Payer: Self-pay

## 2023-07-22 ENCOUNTER — Other Ambulatory Visit (HOSPITAL_COMMUNITY): Payer: Self-pay

## 2023-07-22 MED ORDER — ESOMEPRAZOLE MAGNESIUM 40 MG PO CPDR
40.0000 mg | DELAYED_RELEASE_CAPSULE | Freq: Every day | ORAL | 3 refills | Status: AC
  Filled 2023-07-22: qty 90, 90d supply, fill #0
  Filled 2023-10-18: qty 90, 90d supply, fill #1
  Filled 2024-01-13: qty 90, 90d supply, fill #2

## 2023-07-28 ENCOUNTER — Telehealth: Payer: Self-pay

## 2023-07-28 NOTE — Telephone Encounter (Signed)
 ----- Message from Aloha Wilhelmenia Raddle sent at 07/28/2023  4:05 PM EDT ----- Regarding: RE: pancreatic pseuodcysts CLM, Thank you for update.  Kalyb Pemble, Please schedule upper EUS in the next 3 to 4 months as able. Thanks. GM ----- Message ----- From: Kennedy Charmaine CROME, NP Sent: 07/28/2023   1:08 PM EDT To: Odetta CROME Curly, RN; Aloha Wilhelmenia Raddle., # Subject: RE: pancreatic pseuodcysts                     He is doing okay so far with the nexium  and pancreatic enzymes but he is interested in the EUS for evaluation of the pancreatic ductal dilation and possible aspiration if needed. No alcohol use.   Thank you again for your help with this. ----- Message ----- From: Wilhelmenia Aloha Raddle., MD Sent: 07/23/2023   6:37 AM EDT To: Charmaine CROME Kennedy, NP Subject: RE: pancreatic pseuodcysts                     OK. Let us  know what you all decide/discuss. Then we can formalize final plan for EUS if he agrees. GM ----- Message ----- From: Kennedy Charmaine CROME, NP Sent: 07/22/2023   2:09 PM EDT To: Aloha Wilhelmenia Raddle., MD Subject: RE: pancreatic pseuodcysts                     Thank you again, He is adamant about zero alcohol. I just spoke with him this morning and reflux and other vague symptoms are somewhat improved with higher PPI therapy. I had this suspicion anyways but given the PD dilation as well as the new cysts I wanted to make sure stay on top of this given his high potential for complications.  I will discuss with him if he is willing to do the EUS given the PD dilation and possible aspiration if needed. I will let you know if he is agreeable and tag Opie Fanton in on the message if so.   CM ----- Message ----- From: Wilhelmenia Aloha Raddle., MD Sent: 07/19/2023   7:11 AM EDT To: Charmaine CROME Kennedy, NP Subject: RE: pancreatic pseuodcysts                     CLM, Thanks for reaching out. I would say that none of the cysts require cystgastrostomy. The largest cyst, could be aspirated,  but not clear if it will help with these vague symptoms and certainly would try to optimize his acid reflux symptoms first and try to get him on PERT therapy if he agrees. Is he still actively drinking? We can set him up for an EUS in the next few months and be prepared to aspirated if we want to, but again not completely clear symptomatically will get him to feel better, and if he has started to drink again that will be problematic. His PD certainly is dilating. This would help us  also ascertain what his ductal stone issues could be in case we need to consider pancreatic ERCP. But if he is actively drinking, I have hesitancy about even offering pancreatic ERCP. Let me know what you all would like me to do. GM ----- Message ----- From: Kennedy Charmaine CROME, NP Sent: 07/18/2023   4:03 PM EDT To: Aloha Wilhelmenia Raddle., MD Subject: pancreatic pseuodcysts                         Hi Dr. Wilhelmenia, I know you  have performed a cyst gastrostomy on this patient before for chronic pancreatitis and pancreas divisum.  I just received his follow-up MRI/MRCP and we have known about 2 pancreatic pseudocysts in the past, one of them has decreased a fair amount in size however the other 1 is minimally decreased.  Unfortunately he has had development of 2 additional new pseudocysts.  He has had some vague intermittent epigastric discomfort and some worsening heartburn over the last few months.  I am curious if these pancreatic pseudocyst could be contributing.  One of the new pseudocysts appears to be fairly large compared to the size of his pseudocysts in the past.  Would you mind taking a look at his imaging and let me know if you think we should just continue to monitor with another MRI in 6 months or if he would benefit from EUS or repeat cyst gastrostomy.  He is not necessarily having any significant weight loss at this point however has had difficulty with gaining weight and recently convinced him on  considering pancreatic enzyme therapy.  Charmaine Melia, MSN, FNP-BC, AGACNP-BC [facility]

## 2023-07-28 NOTE — Telephone Encounter (Signed)
Recall has been entered  

## 2023-08-07 ENCOUNTER — Other Ambulatory Visit: Payer: Self-pay | Admitting: *Deleted

## 2023-08-07 DIAGNOSIS — N433 Hydrocele, unspecified: Secondary | ICD-10-CM

## 2023-08-11 ENCOUNTER — Other Ambulatory Visit (HOSPITAL_COMMUNITY): Payer: Self-pay

## 2023-08-18 ENCOUNTER — Encounter: Payer: Self-pay | Admitting: Urology

## 2023-08-18 ENCOUNTER — Ambulatory Visit: Admitting: Urology

## 2023-08-18 VITALS — BP 111/62 | HR 82

## 2023-08-18 DIAGNOSIS — N433 Hydrocele, unspecified: Secondary | ICD-10-CM | POA: Diagnosis not present

## 2023-08-18 MED ORDER — CYCLOBENZAPRINE HCL 5 MG PO TABS: 5.0000 mg | ORAL_TABLET | Freq: Three times a day (TID) | ORAL | 5 refills | Status: AC | PRN

## 2023-08-18 NOTE — Progress Notes (Signed)
 08/18/2023 3:09 PM   Kyle Giles 07-08-1960 [id number]  Referring provider: Marvine Rush, MD [address] [place],  [place] [address]  Scrotal swelling   HPI: Kyle Giles is a 62yo here for evaluation of a right hydrocele. Starting February he noted a bulge in his right inguinal region which was painful at the time. Since then he has had multiple recurrences of the pain and swelling. The swelling lasts 4-5 hours then resolves. He underwent MRI 6/10 which showed a righr inguinal fluid collection   PMH: Past Medical History:  Diagnosis Date   Anxiety    DM type 2 (diabetes mellitus, type 2) (HCC)    GERD (gastroesophageal reflux disease)    Heart murmur    saw dr bernard 06-29-2019 echo done    Hypercholesterolemia    Hypertension    Palpitations    none in a long time as of 10-19-2019   Pancreatitis    S/P colonoscopy 09/2009   Dr. Mavis: Moderate diverticulosis throughout colon, otherwise normal    Umbilical hernia     Surgical History: Past Surgical History:  Procedure Laterality Date   BALLOON DILATION N/A 05/02/2022   Procedure: BALLOON DILATION;  Surgeon: Wilhelmenia Aloha Raddle., MD;  Location: [facility];  Service: Gastroenterology;  Laterality: N/A;   BIOPSY  05/02/2022   Procedure: BIOPSY;  Surgeon: Wilhelmenia Aloha Raddle., MD;  Location: [facility];  Service: Gastroenterology;;   BIOPSY  06/10/2022   Procedure: BIOPSY;  Surgeon: Wilhelmenia Aloha Raddle., MD;  Location: [facility];  Service: Gastroenterology;;   COLONOSCOPY N/A 12/21/2019   Procedure: COLONOSCOPY;  Surgeon: Mavis Anes, MD;  Location: [facility];  Service: Gastroenterology;  Laterality: N/A;   cyst removed from finger  age 41   ESOPHAGOGASTRODUODENOSCOPY (EGD) WITH PROPOFOL  N/A 08/13/2021   Procedure: ESOPHAGOGASTRODUODENOSCOPY (EGD) WITH PROPOFOL ;  Surgeon: Cindie Carlin POUR, DO;  Location: [facility];  Service: Endoscopy;  Laterality: N/A;  1:30pm    ESOPHAGOGASTRODUODENOSCOPY (EGD) WITH PROPOFOL  N/A 05/02/2022   Procedure: ESOPHAGOGASTRODUODENOSCOPY (EGD) WITH PROPOFOL ;  Surgeon: Wilhelmenia Aloha Raddle., MD;  Location: [facility];  Service: Gastroenterology;  Laterality: N/A;   ESOPHAGOGASTRODUODENOSCOPY (EGD) WITH PROPOFOL  N/A 06/10/2022   Procedure: ESOPHAGOGASTRODUODENOSCOPY (EGD) WITH PROPOFOL ;  Surgeon: Wilhelmenia Aloha Raddle., MD;  Location: [facility];  Service: Gastroenterology;  Laterality: N/A;   EUS N/A 05/02/2022   Procedure: UPPER ENDOSCOPIC ULTRASOUND (EUS) RADIAL;  Surgeon: Wilhelmenia Aloha Raddle., MD;  Location: [facility];  Service: Gastroenterology;  Laterality: N/A;   HERNIA REPAIR     as baby   PANCREATIC STENT PLACEMENT  05/02/2022   Procedure: CYSTGASTROSTOMY;  Surgeon: Mansouraty, Aloha Raddle., MD;  Location: [facility];  Service: Gastroenterology;;   POLYPECTOMY  12/21/2019   Procedure: POLYPECTOMY;  Surgeon: Mavis Anes, MD;  [facility];  Service: Gastroenterology;;   pyloric stenosis repair     as baby, operated at [facility] REMOVAL  06/10/2022   Procedure: STENT REMOVAL;  Surgeon: Wilhelmenia Aloha Raddle., MD;  Location: [facility];  Service: Gastroenterology;;   UMBILICAL HERNIA REPAIR N/A 10/29/2019   Procedure: OPEN UMBILICAL HERNIA REPAIR WITH MESH;  Surgeon: Signe Mitzie DELENA, MD;  Location: [facility];  Service: General;  Laterality: N/A;   XI ROBOTIC ASSISTED INGUINAL HERNIA REPAIR WITH MESH Left 05/13/2022   Procedure: XI ROBOTIC ASSISTED INGUINAL HERNIA REPAIR WITH MESH;  Surgeon: Evonnie Dorothyann DELENA, DO;  Location: [facility];  Service: General;  Laterality: Left;    Home Medications:  Allergies as of  08/18/2023   No Known Allergies      Medication List        Accurate as of August 18, 2023  3:09 PM. If you have any questions, ask your nurse or doctor.          ALPRAZolam  1 MG tablet Commonly known as: Xanax  Take 1 tablet (1 mg total) by mouth 3 (three)  times daily.   ALPRAZolam  1 MG tablet Commonly known as: Xanax  Take 1 tablet (1 mg total) by mouth 3 (three) times daily.   atorvastatin  10 MG tablet Commonly known as: LIPITOR Take 1 tablet (10 mg total) by mouth daily.   dicyclomine  10 MG capsule Commonly known as: BENTYL  Take 1 capsule (10 mg total) by mouth daily as needed for spasms.   diltiazem  180 MG 24 hr capsule Commonly known as: DILACOR XR  Take 1 capsule (180 mg total) by mouth daily.   esomeprazole  40 MG capsule Commonly known as: NEXIUM  Take 1 capsule (40 mg total) by mouth daily before breakfast.   fenofibrate  160 MG tablet Take 1 tablet (160 mg total) by mouth daily.   FreeStyle Libre 3 Plus Sensor Misc Checking sugar daily. Change sensor every 15 days   Jardiance  10 MG Tabs tablet Generic drug: empagliflozin  Take 1 tablet (10 mg total) by mouth every morning.   olmesartan  20 MG tablet Commonly known as: BENICAR  Take 1 tablet (20 mg total) by mouth daily.   Systane Complete 0.6 % Soln Generic drug: Propylene Glycol Place 1 drop into both eyes daily as needed (dry eyes).   traMADol 50 MG tablet Commonly known as: ULTRAM Take 100 mg by mouth every 6 (six) hours.   Vitamin D  50 MCG (2000 UT) tablet Take 2,000 Units by mouth 2 (two) times daily.        Allergies: No Known Allergies  Family History: Family History  Problem Relation Age of Onset   Colon cancer Neg Hx     Social History:  reports that he has been smoking cigarettes. He has a 45 pack-year smoking history. He has been exposed to tobacco smoke. He has never used smokeless tobacco. He reports that he does not currently use alcohol. He reports that he does not currently use drugs.  ROS: All other review of systems were reviewed and are negative except what is noted above in HPI  Physical Exam: BP 111/62   Pulse 82   Constitutional:  Alert and oriented, No acute distress. HEENT: [place] AT, moist mucus membranes.  Trachea midline, no  masses. Cardiovascular: No clubbing, cyanosis, or edema. Respiratory: Normal respiratory effort, no increased work of breathing. GI: Abdomen is soft, nontender, nondistended, no abdominal masses GU: No CVA tenderness. Circumcised phallus. No masses/lesions on penis, testis, scrotum. No inguinal mass Lymph: No cervical or inguinal lymphadenopathy. Skin: No rashes, bruises or suspicious lesions. Neurologic: Grossly intact, no focal deficits, moving all 4 extremities. Psychiatric: Normal mood and affect.  Laboratory Data: Lab Results  Component Value Date   WBC 10.4 12/26/2022   HGB 11.2 (L) 12/26/2022   HCT 35.3 (L) 12/26/2022   MCV 96.7 12/26/2022   PLT 427 (H) 12/26/2022    Lab Results  Component Value Date   CREATININE 1.04 01/07/2023    No results found for: PSA  No results found for: TESTOSTERONE   Lab Results  Component Value Date   HGBA1C 7.1 (A) 05/01/2023    Urinalysis    Component Value Date/Time   COLORURINE YELLOW 12/26/2022 1215   APPEARANCEUR Clear  02/07/2023 1055   LABSPEC 1.016 12/26/2022 1215   PHURINE 5.0 12/26/2022 1215   GLUCOSEU 3+ (A) 02/07/2023 1055   HGBUR NEGATIVE 12/26/2022 1215   BILIRUBINUR Negative 02/07/2023 1055   KETONESUR 5 (A) 12/26/2022 1215   PROTEINUR Negative 02/07/2023 1055   PROTEINUR NEGATIVE 12/26/2022 1215   NITRITE Negative 02/07/2023 1055   NITRITE NEGATIVE 12/26/2022 1215   LEUKOCYTESUR Negative 02/07/2023 1055   LEUKOCYTESUR NEGATIVE 12/26/2022 1215    Lab Results  Component Value Date   LABMICR Comment 02/07/2023   BACTERIA NONE SEEN 12/26/2022    Pertinent Imaging: MRI 07/15/2023: Images reviewed and discussed with the patient  No results found for this or any previous visit.  No results found for this or any previous visit.  No results found for this or any previous visit.  No results found for this or any previous visit.  Results for orders placed during the hospital encounter of 11/25/22  US   RENAL  Narrative CLINICAL DATA:  Chronic renal disease  EXAM: RENAL / URINARY TRACT ULTRASOUND COMPLETE  COMPARISON:  CT abdomen 05/31/2022  FINDINGS: Right Kidney:  Renal measurements: 11.4 x 5.8 x 5.1 cm = volume: 173.3 mL. Normal renal cortical thickness and echogenicity. No hydronephrosis. There is a 1 cm hypoechoic lesion, likely a complicated cyst.  Left Kidney:  Renal measurements: 9.9 x 5.6 x 4.7 cm = volume: 136.6 mL. Normal renal cortical thickness and echogenicity. No hydronephrosis. There is a 2.3 cm hypoechoic lesion, potentially a small cyst. No imaging follow-up needed.  Bladder:  Appears normal for degree of bladder distention.  Other:  None.  IMPRESSION: No hydronephrosis.  Probable complicated cyst right kidney. Recommend follow-up renal ultrasound in 6 months.   Electronically Signed By: Bard Moats M.D. On: 11/25/2022 13:18  No results found for this or any previous visit.  No results found for this or any previous visit.  No results found for this or any previous visit.   Assessment & Plan:    1. Hydrocele, unspecified hydrocele type (Primary) -we will proceed with observation since the fluid collection has resolved currently  - Urinalysis, Routine w reflex microscopic   No follow-ups on file.  Belvie Clara, MD  [facility]

## 2023-08-18 NOTE — Addendum Note (Signed)
 Addended by: Liesel Peckenpaugh L on: 08/18/2023 03:22 PM   Modules accepted: Orders

## 2023-08-18 NOTE — Patient Instructions (Signed)
Hydrocele, Adult A hydrocele is a collection of fluid in the loose pouch of skin that holds the testicles (scrotum). It can occur in one or both testicles. This may happen because: The amount of fluid produced in the scrotum is not absorbed by the rest of the body. Fluid from the abdomen fills the scrotum. Normally, the testicles develop in the abdomen and then drop into the scrotum before birth. The tube that the testicles travel through usually closes after the testicles drop. If the tube does not close, fluid from the abdomen can fill the scrotum. This is not very common in adults. What are the causes? A hydrocele may be caused by: An injury to the scrotum. An infection. Decreased blood flow to the scrotum. Twisting of a testicle (testicular torsion). A birth defect. A tumor or cancer of the testicle. Sometimes, the cause is not known. What are the signs or symptoms? A hydrocele feels like a water-filled balloon. It may also feel heavy. Other symptoms include: Swelling of the scrotum. The swelling may decrease when you lie down. You may also notice more swelling at night than in the morning. This is called a communicating hydrocele, in which the fluid in the scrotum goes back into the abdominal cavity when the position of the scrotum changes. Swelling of the groin. Mild discomfort in the scrotum. Pain. This can develop if the hydrocele was caused by infection or twisting. The larger the hydrocele, the more likely you are to have pain. Swelling may also cause pain. How is this diagnosed? This condition may be diagnosed based on a physical exam and your medical history. You may also have tests, including: Imaging tests, such as an ultrasound. A transillumination test. This test takes place in a dark room where a light is placed on the skin of the scrotum. Clear liquid will not impede the light and the scrotum will be illuminated. This helps a health care provider distinguish a hydrocele from a  tumor. Blood or urine tests. How is this treated? Most hydroceles go away on their own. If you have no discomfort or pain, your health care provider may suggest close monitoring of your condition until the condition goes away or symptoms develop. This is called watch and wait or watchful waiting. If treatment is needed, it may include: Treating an underlying condition. This may include taking an antibiotic medicine to treat an infection. Having surgery to stop fluid from collecting in the scrotum. Having surgery to drain the fluid. Surgery may include: Hydrocelectomy. For this procedure, an incision is made in the scrotum to remove the fluid. Needle aspiration. A needle is used to drain fluid. However, the fluid buildup will come back quickly and may lead to an infection of the scrotum. This is rarely done. Follow these instructions at home: Medicines Take over-the-counter and prescription medicines only as told by your health care provider. If you were prescribed an antibiotic medicine, take it as told by your health care provider. Do not stop taking the antibiotic even if you start to feel better. General instructions Watch the hydrocele for any changes. Keep all follow-up visits. This is important. Contact a health care provider if: You notice any changes in the hydrocele. The swelling in your scrotum or groin gets worse. The hydrocele becomes red, firm, painful, or tender to the touch. You have a fever. Get help right away if you: Develop a lot of pain or your pain becomes worse. Have chills. Have a high fever. Summary A hydrocele is   a collection of fluid in the loose pouch of skin that holds the testicles (scrotum). A hydrocele can cause swelling, discomfort, and pain. In adults, the cause of a hydrocele may not be known. However, it is sometimes caused by an infection or the twisting of a testicle. Hydroceles often go away on their own. If a hydrocele causes pain, treating the  underlying cause may be needed to ease the pain. This information is not intended to replace advice given to you by your health care provider. Make sure you discuss any questions you have with your health care provider. Document Revised: 09/07/2020 Document Reviewed: 09/07/2020 Elsevier Patient Education  2024 [employer]  

## 2023-08-19 ENCOUNTER — Other Ambulatory Visit (HOSPITAL_COMMUNITY): Payer: Self-pay

## 2023-08-19 ENCOUNTER — Other Ambulatory Visit: Payer: Self-pay

## 2023-08-19 ENCOUNTER — Other Ambulatory Visit: Payer: Self-pay | Admitting: Gastroenterology

## 2023-08-19 LAB — URINALYSIS, ROUTINE W REFLEX MICROSCOPIC
Bilirubin, UA: NEGATIVE
Glucose, UA: NEGATIVE
Ketones, UA: NEGATIVE
Leukocytes,UA: NEGATIVE
Nitrite, UA: NEGATIVE
Protein,UA: NEGATIVE
RBC, UA: NEGATIVE
Specific Gravity, UA: 1.01 (ref 1.005–1.030)
Urobilinogen, Ur: 0.2 mg/dL (ref 0.2–1.0)
pH, UA: 6 (ref 5.0–7.5)

## 2023-08-19 MED ORDER — ZENPEP 40000-126000 UNITS PO CPEP
1.0000 | ORAL_CAPSULE | Freq: Three times a day (TID) | ORAL | 10 refills | Status: AC
  Filled 2023-08-19: qty 100, 34d supply, fill #0
  Filled 2023-09-18: qty 100, 34d supply, fill #1
  Filled 2023-10-21: qty 100, 34d supply, fill #2
  Filled 2023-11-25: qty 100, 34d supply, fill #3
  Filled 2023-12-25: qty 100, 34d supply, fill #4
  Filled 2024-01-22: qty 100, 34d supply, fill #5

## 2023-09-01 ENCOUNTER — Other Ambulatory Visit (HOSPITAL_COMMUNITY): Payer: Self-pay

## 2023-09-01 ENCOUNTER — Other Ambulatory Visit: Payer: Self-pay

## 2023-09-01 MED ORDER — FENOFIBRATE 160 MG PO TABS
160.0000 mg | ORAL_TABLET | Freq: Every day | ORAL | 2 refills | Status: AC
  Filled 2023-09-01: qty 90, 90d supply, fill #0
  Filled 2023-12-06: qty 90, 90d supply, fill #1
  Filled 2024-02-22: qty 90, 90d supply, fill #2

## 2023-09-03 ENCOUNTER — Ambulatory Visit: Admitting: Nurse Practitioner

## 2023-09-03 ENCOUNTER — Encounter: Payer: Self-pay | Admitting: Nurse Practitioner

## 2023-09-03 ENCOUNTER — Other Ambulatory Visit (HOSPITAL_COMMUNITY): Payer: Self-pay

## 2023-09-03 VITALS — BP 98/70 | HR 70 | Ht 62.0 in | Wt 97.0 lb

## 2023-09-03 DIAGNOSIS — Z7984 Long term (current) use of oral hypoglycemic drugs: Secondary | ICD-10-CM

## 2023-09-03 DIAGNOSIS — N182 Chronic kidney disease, stage 2 (mild): Secondary | ICD-10-CM | POA: Diagnosis not present

## 2023-09-03 DIAGNOSIS — E1122 Type 2 diabetes mellitus with diabetic chronic kidney disease: Secondary | ICD-10-CM

## 2023-09-03 DIAGNOSIS — I10 Essential (primary) hypertension: Secondary | ICD-10-CM

## 2023-09-03 DIAGNOSIS — E782 Mixed hyperlipidemia: Secondary | ICD-10-CM

## 2023-09-03 LAB — POCT GLYCOSYLATED HEMOGLOBIN (HGB A1C): Hemoglobin A1C: 7.2 % — AB (ref 4.0–5.6)

## 2023-09-03 MED ORDER — EMPAGLIFLOZIN 10 MG PO TABS
10.0000 mg | ORAL_TABLET | Freq: Every morning | ORAL | 11 refills | Status: DC
  Filled 2023-09-03 – 2023-09-04 (×2): qty 30, 30d supply, fill #0
  Filled 2023-10-07: qty 30, 30d supply, fill #1
  Filled 2023-11-09: qty 30, 30d supply, fill #2
  Filled 2023-12-06 – 2023-12-07 (×2): qty 30, 30d supply, fill #3
  Filled 2023-12-09: qty 30, 30d supply, fill #0
  Filled 2024-01-05 – 2024-01-07 (×2): qty 30, 30d supply, fill #1
  Filled 2024-01-22: qty 30, 30d supply, fill #2
  Filled ????-??-??: fill #2

## 2023-09-03 NOTE — Progress Notes (Signed)
 Endocrinology Follow Up Note       09/03/2023, 3:57 PM   Subjective:    Patient ID: Kyle Giles, male    DOB: 01/20/1961.  Kyle Giles is being seen in follow up after being seen in consultation for management of currently uncontrolled symptomatic diabetes requested by  Kyle Rush, MD.   Past Medical History:  Diagnosis Date   Anxiety    DM type 2 (diabetes mellitus, type 2) (HCC)    GERD (gastroesophageal reflux disease)    Heart murmur    saw dr bernard 06-29-2019 echo done    Hypercholesterolemia    Hypertension    Palpitations    none in a long time as of 10-19-2019   Pancreatitis    S/P colonoscopy 09/2009   Dr. Mavis: Moderate diverticulosis throughout colon, otherwise normal    Umbilical hernia     Past Surgical History:  Procedure Laterality Date   BALLOON DILATION N/A 05/02/2022   Procedure: BALLOON DILATION;  Surgeon: Wilhelmenia Aloha Raddle., MD;  Location: [facility];  Service: Gastroenterology;  Laterality: N/A;   BIOPSY  05/02/2022   Procedure: BIOPSY;  Surgeon: Wilhelmenia Aloha Raddle., MD;  Location: [facility];  Service: Gastroenterology;;   BIOPSY  06/10/2022   Procedure: BIOPSY;  Surgeon: Wilhelmenia Aloha Raddle., MD;  Location: [facility];  Service: Gastroenterology;;   COLONOSCOPY N/A 12/21/2019   Procedure: COLONOSCOPY;  Surgeon: Mavis Anes, MD;  Location: [facility];  Service: Gastroenterology;  Laterality: N/A;   cyst removed from finger  age 44   ESOPHAGOGASTRODUODENOSCOPY (EGD) WITH PROPOFOL  N/A 08/13/2021   Procedure: ESOPHAGOGASTRODUODENOSCOPY (EGD) WITH PROPOFOL ;  Surgeon: Cindie Carlin POUR, DO;  Location: [facility];  Service: Endoscopy;  Laterality: N/A;  1:30pm   ESOPHAGOGASTRODUODENOSCOPY (EGD) WITH PROPOFOL  N/A 05/02/2022   Procedure: ESOPHAGOGASTRODUODENOSCOPY (EGD) WITH PROPOFOL ;  Surgeon: Wilhelmenia Aloha Raddle., MD;  Location: [facility];   Service: Gastroenterology;  Laterality: N/A;   ESOPHAGOGASTRODUODENOSCOPY (EGD) WITH PROPOFOL  N/A 06/10/2022   Procedure: ESOPHAGOGASTRODUODENOSCOPY (EGD) WITH PROPOFOL ;  Surgeon: Wilhelmenia Aloha Raddle., MD;  Location: [facility];  Service: Gastroenterology;  Laterality: N/A;   EUS N/A 05/02/2022   Procedure: UPPER ENDOSCOPIC ULTRASOUND (EUS) RADIAL;  Surgeon: Wilhelmenia Aloha Raddle., MD;  Location: [facility];  Service: Gastroenterology;  Laterality: N/A;   HERNIA REPAIR     as baby   PANCREATIC STENT PLACEMENT  05/02/2022   Procedure: CYSTGASTROSTOMY;  Surgeon: Mansouraty, Aloha Raddle., MD;  Location: [facility];  Service: Gastroenterology;;   POLYPECTOMY  12/21/2019   Procedure: POLYPECTOMY;  Surgeon: Mavis Anes, MD;  [facility];  Service: Gastroenterology;;   pyloric stenosis repair     as baby, operated at [facility] REMOVAL  06/10/2022   Procedure: STENT REMOVAL;  Surgeon: Wilhelmenia Aloha Raddle., MD;  Location: [facility];  Service: Gastroenterology;;   UMBILICAL HERNIA REPAIR N/A 10/29/2019   Procedure: OPEN UMBILICAL HERNIA REPAIR WITH MESH;  Surgeon: Signe Mitzie DELENA, MD;  Location: [facility];  Service: General;  Laterality: N/A;   XI ROBOTIC ASSISTED INGUINAL HERNIA REPAIR WITH MESH Left 05/13/2022   Procedure: XI ROBOTIC ASSISTED INGUINAL HERNIA REPAIR WITH MESH;  Surgeon: Evonnie Dorothyann DELENA, DO;  Location: [facility];  Service: General;  Laterality: Left;    Social History   Socioeconomic History   Marital status: Married    Spouse name: Not on file   Number of children: 2   Years of education: Not on file   Highest education level: Not on file  Occupational History   Occupation: Higher education careers adviser: [employer]    Comment: Kyle Giles  Tobacco Use   Smoking status: Every Day    Current packs/day: 1.50    Average packs/day: 1.5 packs/day for 30.0 years (45.0 ttl pk-yrs)    Types: Cigarettes    Passive exposure:  Current   Smokeless tobacco: Never   Tobacco comments:    1 ppd since 3 weeks as of 10-19-2019  Vaping Use   Vaping status: Never Used  Substance and Sexual Activity   Alcohol use: Not Currently    Comment: quit 05/30/21   Drug use: Not Currently   Sexual activity: Not on file  Other Topics Concern   Not on file  Social History Narrative   Not on file   Social Drivers of Health   Financial Resource Strain: Not on file  Food Insecurity: No Food Insecurity (01/02/2022)   Hunger Vital Sign    Worried About Running Out of Food in the Last Year: Never true    Ran Out of Food in the Last Year: Never true  Transportation Needs: No Transportation Needs (01/02/2022)   PRAPARE - Administrator, Civil Service (Medical): No    Lack of Transportation (Non-Medical): No  Physical Activity: Not on file  Stress: Not on file  Social Connections: Not on file    Family History  Problem Relation Age of Onset   Colon cancer Neg Hx     Outpatient Encounter Medications as of 09/03/2023  Medication Sig   ALPRAZolam  (XANAX ) 1 MG tablet Take 1 tablet (1 mg total) by mouth 3 (three) times daily.   ALPRAZolam  (XANAX ) 1 MG tablet Take 1 tablet (1 mg total) by mouth 3 (three) times daily.   atorvastatin  (LIPITOR) 10 MG tablet Take 1 tablet (10 mg total) by mouth daily.   Cholecalciferol (VITAMIN D ) 50 MCG (2000 UT) tablet Take 2,000 Units by mouth 2 (two) times daily.   Continuous Glucose Sensor (FREESTYLE LIBRE 3 PLUS SENSOR) MISC Checking sugar daily. Change sensor every 15 days   cyclobenzaprine  (FLEXERIL ) 5 MG tablet Take 1 tablet (5 mg total) by mouth 3 (three) times daily as needed for muscle spasms.   dicyclomine  (BENTYL ) 10 MG capsule Take 1 capsule (10 mg total) by mouth daily as needed for spasms.   diltiazem  (DILACOR XR ) 180 MG 24 hr capsule Take 1 capsule (180 mg total) by mouth daily.   esomeprazole  (NEXIUM ) 40 MG capsule Take 1 capsule (40 mg total) by mouth daily before  breakfast.   fenofibrate  160 MG tablet Take 1 tablet (160 mg total) by mouth daily.   olmesartan  (BENICAR ) 20 MG tablet Take 1 tablet (20 mg total) by mouth daily.   Pancrelipase , Lip-Prot-Amyl, (ZENPEP ) 40000-126000 units CPEP Take 1 capsule (40,000 Units total) by mouth 3 (three) times daily with meals.   Propylene Glycol (SYSTANE COMPLETE) 0.6 % SOLN Place 1 drop into both eyes daily as needed (dry eyes).   traMADol (ULTRAM) 50 MG tablet Take 100 mg by mouth every 6 (six) hours.   [DISCONTINUED] empagliflozin  (JARDIANCE ) 10 MG TABS tablet Take 1 tablet (10 mg total) by mouth  every morning.   empagliflozin  (JARDIANCE ) 10 MG TABS tablet Take 1 tablet (10 mg total) by mouth every morning.   No facility-administered encounter medications on file as of 09/03/2023.    ALLERGIES: No Known Allergies  VACCINATION STATUS: Immunization History  Administered Date(s) Administered   Moderna Sars-Covid-2 Vaccination 04/10/2019, 05/12/2019    Diabetes He presents for his follow-up diabetic visit. He has type 2 diabetes mellitus. Onset time: diagnosed at approx age of 52. His disease course has been stable. There are no hypoglycemic associated symptoms. Associated symptoms include polyuria. Pertinent negatives for diabetes include no weight loss. There are no hypoglycemic complications. Symptoms are stable. Diabetic complications include nephropathy. (Chronic pancreatitis) Risk factors for coronary artery disease include diabetes mellitus, male sex, hypertension, tobacco exposure, sedentary lifestyle and stress. Current diabetic treatment includes oral agent (monotherapy). He is compliant with treatment most of the time. He is following a low fat/cholesterol, high fiber and generally healthy diet. Meal planning includes avoidance of concentrated sweets. He has not had a previous visit with a dietitian. He rarely participates in exercise. His home blood glucose trend is fluctuating minimally. His overall blood  glucose range is 110-130 mg/dl. (He presents today with his CGM showing at goal glycemic profile.  His POCT A1c today is 7.2%, increasing from last visit of 7.1%.  Analysis of his CGM shows TIR 92%, TAR 8%, TBR 0% with a GMI of 6.5%. ) An ACE inhibitor/angiotensin II receptor blocker is being taken. He does not see a podiatrist.Eye exam is not current.     Review of systems  Constitutional: +increasing body weight (good development for him), current Body mass index is 17.74 kg/m., no fatigue, no subjective hyperthermia, no subjective hypothermia Eyes: no blurry vision, no xerophthalmia ENT: no sore throat, no nodules palpated in throat, no dysphagia/odynophagia, no hoarseness Cardiovascular: no chest pain, no shortness of breath, no palpitations, no leg swelling Respiratory: no cough, no shortness of breath Gastrointestinal: no nausea/vomiting/diarrhea, Musculoskeletal: + chronic back pain Skin: no rashes, no hyperemia Neurological: no tremors, no numbness, no tingling, no dizziness Psychiatric: no depression, no anxiety  Objective:     BP 98/70 (BP Location: Left Arm, Patient Position: Sitting, Cuff Size: Normal)   Pulse 70   Ht 5' 2 (1.575 m)   Wt 97 lb (44 kg)   BMI 17.74 kg/m   Wt Readings from Last 3 Encounters:  09/03/23 97 lb (44 kg)  07/08/23 92 lb 3.2 oz (41.8 kg)  06/25/23 94 lb (42.6 kg)     BP Readings from Last 3 Encounters:  09/03/23 98/70  08/18/23 111/62  07/08/23 (!) 99/59     Physical Exam- Limited  Constitutional:  Body mass index is 17.74 kg/m. , not in acute distress, normal state of mind Eyes:  EOMI, no exophthalmos Musculoskeletal: no gross deformities, strength intact in all four extremities, no gross restriction of joint movements Skin:  no rashes, no hyperemia, +slight nicotinic discoloration to fingernails Neurological: no tremor with outstretched hands   Diabetic Foot Exam - Simple   No data filed      CMP ( most recent) CMP      Component Value Date/Time   NA 140 01/07/2023 1107   K 4.3 01/07/2023 1107   CL 103 01/07/2023 1107   CO2 22 01/07/2023 1107   GLUCOSE 126 (H) 01/07/2023 1107   GLUCOSE 92 12/26/2022 1228   BUN 18 01/07/2023 1107   CREATININE 1.04 01/07/2023 1107   CREATININE 1.16 07/25/2021 0907   CALCIUM   10.1 01/07/2023 1107   PROT 6.6 01/07/2023 1107   ALBUMIN 4.3 01/07/2023 1107   AST 29 01/07/2023 1107   ALT 43 01/07/2023 1107   ALKPHOS 62 01/07/2023 1107   BILITOT 0.2 01/07/2023 1107   EGFR 80 01/07/2023 1107   GFRNONAA 56 (L) 12/26/2022 1228     Diabetic Labs (most recent): Lab Results  Component Value Date   HGBA1C 7.2 (A) 09/03/2023   HGBA1C 7.1 (A) 05/01/2023   HGBA1C 6.0 (A) 01/30/2023     Lipid Panel ( most recent) Lipid Panel     Component Value Date/Time   CHOL 101 01/03/2022 1200   TRIG 113 01/03/2022 1200   HDL 16 (L) 01/03/2022 1200   CHOLHDL 6.3 01/03/2022 1200   VLDL 23 01/03/2022 1200   LDLCALC 62 01/03/2022 1200      No results found for: TSH, FREET4         Assessment & Plan:   1) Type 2 diabetes mellitus with stage 2 chronic kidney disease, without long-term current use of insulin  (HCC) (Primary)  He presents today with his CGM showing at goal glycemic profile.  His POCT A1c today is 7.2%, increasing from last visit of 7.1%.  Analysis of his CGM shows TIR 92%, TAR 8%, TBR 0% with a GMI of 6.5%.   - Kyle Giles has currently uncontrolled symptomatic type 2 DM since 63 years of age.   -Recent labs reviewed.  - I had a long discussion with him about the progressive nature of diabetes and the pathology behind its complications. -his diabetes is complicated by chronic pancreatitis and CKD stage 3 (sees nephrology) and he remains at a high risk for more acute and chronic complications which include CAD, CVA, CKD, retinopathy, and neuropathy. These are all discussed in detail with him.  The following Lifestyle Medicine recommendations according to  American College of Lifestyle Medicine [facility] [address] [address]) were discussed and offered to patient and he agrees to start the journey:  A. Whole Foods, Plant-based plate comprising of fruits and vegetables, plant-based proteins, whole-grain carbohydrates was discussed in detail with the patient.   A list for source of those nutrients were also provided to the patient.  Patient will use only water  or unsweetened tea for hydration. B.  The need to stay away from risky substances including alcohol, smoking; obtaining 7 to 9 hours of restorative sleep, at least 150 minutes of moderate intensity exercise weekly, the importance of healthy social connections,  and stress reduction techniques were discussed. C.  A full color page of  Calorie density of various food groups per pound showing examples of each food groups was provided to the patient.  - Nutritional counseling repeated at each appointment due to patients tendency to fall back in to old habits.  - The patient admits there is a room for improvement in their diet and drink choices. -  Suggestion is made for the patient to avoid simple carbohydrates from their diet including Cakes, Sweet Desserts / Pastries, Ice Cream, Soda (diet and regular), Sweet Tea, Candies, Chips, Cookies, Sweet Pastries, Store Bought Juices, Alcohol in Excess of 1-2 drinks a day, Artificial Sweeteners, Coffee Creamer, and Sugar-free Products. This will help patient to have stable blood glucose profile and potentially avoid unintended weight gain.   - I encouraged the patient to switch to unprocessed or minimally processed complex starch and increased protein intake (animal or plant source), fruits, and vegetables.   - Patient is advised to stick to a routine mealtimes to  eat 3 meals a day and avoid unnecessary snacks (to snack only to correct hypoglycemia).  - I have approached him with the following individualized plan to manage his diabetes and patient agrees:   -He is advised to  continue his Jardiance  10 mg po daily- I did send in refills of this for him today.    His previous chronic heavy alcohol use may have damaged his pancreas which may need insulin  treatment in the future, however he is well controlled at this time.  -he is encouraged to continue monitoring glucose once daily maybe 2-3 times per week to keep an eye on glucose.  He does not need to monitor multiple times per day due to safe medication regimen.  - Adjustment parameters are given to him for hypo and hyperglycemia in writing.  - he is not a candidate for full dose Metformin  due to concurrent renal insufficiency.  - he is not a candidate for incretin therapy given chronic pancreatitis and body habitus with BMI of 18.  - Specific targets for  A1c; LDL, HDL, and Triglycerides were discussed with the patient.  2) Blood Pressure /Hypertension:  his blood pressure is controlled to target.   he is advised to continue his current medications as prescribed by his PCP/nephrologist.  3) Lipids/Hyperlipidemia:    Review of his recent lipid panel from 08/23/22 showed controlled LDL at 97 .  he is advised to continue Atorvastatin  10 mg daily at bedtime.  Side effects and precautions discussed with him.  4)  Weight/Diet:  his Body mass index is 17.74 kg/m.  -   he is NOT a candidate for weight loss.  Exercise, and detailed carbohydrates information provided  -  detailed on discharge instructions.  5) Chronic Care/Health Maintenance: -he is on ACEI/ARB and Statin medications and is encouraged to initiate and continue to follow up with Ophthalmology, Dentist, Podiatrist at least yearly or according to recommendations, and advised to QUIT SMOKING. I have recommended yearly flu vaccine and pneumonia vaccine at least every 5 years; moderate intensity exercise for up to 150 minutes weekly; and sleep for at least 7 hours a day.  - he is advised to maintain close follow up with Kyle Rush, MD for primary care needs,  as well as his other providers for optimal and coordinated care.     I spent  39  minutes in the care of the patient today including review of labs from CMP, Lipids, Thyroid  Function, Hematology (current and previous including abstractions from other facilities); face-to-face time discussing  his blood glucose readings/logs, discussing hypoglycemia and hyperglycemia episodes and symptoms, medications doses, his options of short and long term treatment based on the latest standards of care / guidelines;  discussion about incorporating lifestyle medicine;  and documenting the encounter. Risk reduction counseling performed per USPSTF guidelines to reduce obesity and cardiovascular risk factors.     Please refer to Patient Instructions for Blood Glucose Monitoring and Insulin /Medications Dosing Guide  in media tab for additional information. Please  also refer to  Patient Self Inventory in the Media  tab for reviewed elements of pertinent patient history.  Kyle Giles Mt participated in the discussions, expressed understanding, and voiced agreement with the above plans.  All questions were answered to his satisfaction. he is encouraged to contact clinic should he have any questions or concerns prior to his return visit.     Follow up plan: - Return in about 4 months (around 01/04/2024) for Diabetes F/U with A1c in office,  No previsit labs.   Benton Rio, [facility] [address] [place], [place] [address] Phone: [phone] Fax: [phone]  09/03/2023, 3:57 PM

## 2023-09-04 ENCOUNTER — Other Ambulatory Visit: Payer: Self-pay

## 2023-09-04 ENCOUNTER — Other Ambulatory Visit (HOSPITAL_COMMUNITY): Payer: Self-pay

## 2023-09-15 DIAGNOSIS — E1122 Type 2 diabetes mellitus with diabetic chronic kidney disease: Secondary | ICD-10-CM | POA: Diagnosis not present

## 2023-09-15 DIAGNOSIS — E875 Hyperkalemia: Secondary | ICD-10-CM | POA: Diagnosis not present

## 2023-09-15 DIAGNOSIS — I129 Hypertensive chronic kidney disease with stage 1 through stage 4 chronic kidney disease, or unspecified chronic kidney disease: Secondary | ICD-10-CM | POA: Diagnosis not present

## 2023-09-15 DIAGNOSIS — N1832 Chronic kidney disease, stage 3b: Secondary | ICD-10-CM | POA: Diagnosis not present

## 2023-09-15 DIAGNOSIS — N2889 Other specified disorders of kidney and ureter: Secondary | ICD-10-CM | POA: Diagnosis not present

## 2023-09-15 DIAGNOSIS — E785 Hyperlipidemia, unspecified: Secondary | ICD-10-CM | POA: Diagnosis not present

## 2023-09-15 DIAGNOSIS — R809 Proteinuria, unspecified: Secondary | ICD-10-CM | POA: Diagnosis not present

## 2023-09-18 ENCOUNTER — Other Ambulatory Visit (HOSPITAL_COMMUNITY): Payer: Self-pay

## 2023-09-22 DIAGNOSIS — E875 Hyperkalemia: Secondary | ICD-10-CM | POA: Diagnosis not present

## 2023-09-22 DIAGNOSIS — I129 Hypertensive chronic kidney disease with stage 1 through stage 4 chronic kidney disease, or unspecified chronic kidney disease: Secondary | ICD-10-CM | POA: Diagnosis not present

## 2023-09-22 DIAGNOSIS — R809 Proteinuria, unspecified: Secondary | ICD-10-CM | POA: Diagnosis not present

## 2023-09-22 DIAGNOSIS — N1832 Chronic kidney disease, stage 3b: Secondary | ICD-10-CM | POA: Diagnosis not present

## 2023-09-22 DIAGNOSIS — E1122 Type 2 diabetes mellitus with diabetic chronic kidney disease: Secondary | ICD-10-CM | POA: Diagnosis not present

## 2023-09-22 DIAGNOSIS — E785 Hyperlipidemia, unspecified: Secondary | ICD-10-CM | POA: Diagnosis not present

## 2023-09-23 ENCOUNTER — Other Ambulatory Visit (HOSPITAL_COMMUNITY): Payer: Self-pay

## 2023-09-23 MED ORDER — FREESTYLE LIBRE 3 PLUS SENSOR MISC
1 refills | Status: DC
  Filled 2023-09-23: qty 2, 30d supply, fill #0
  Filled 2023-11-17: qty 2, 30d supply, fill #1

## 2023-09-24 ENCOUNTER — Other Ambulatory Visit: Payer: Self-pay

## 2023-09-24 ENCOUNTER — Other Ambulatory Visit (HOSPITAL_COMMUNITY): Payer: Self-pay

## 2023-10-02 ENCOUNTER — Encounter: Payer: Self-pay | Admitting: Gastroenterology

## 2023-10-07 ENCOUNTER — Other Ambulatory Visit (HOSPITAL_COMMUNITY): Payer: Self-pay

## 2023-10-08 ENCOUNTER — Other Ambulatory Visit (HOSPITAL_COMMUNITY): Payer: Self-pay

## 2023-10-18 ENCOUNTER — Other Ambulatory Visit (HOSPITAL_COMMUNITY): Payer: Self-pay

## 2023-10-21 ENCOUNTER — Other Ambulatory Visit (HOSPITAL_COMMUNITY): Payer: Self-pay

## 2023-11-09 ENCOUNTER — Other Ambulatory Visit (HOSPITAL_COMMUNITY): Payer: Self-pay

## 2023-11-17 ENCOUNTER — Other Ambulatory Visit (HOSPITAL_COMMUNITY): Payer: Self-pay

## 2023-11-17 ENCOUNTER — Other Ambulatory Visit: Payer: Self-pay

## 2023-11-19 ENCOUNTER — Encounter: Payer: Self-pay | Admitting: *Deleted

## 2023-11-20 ENCOUNTER — Other Ambulatory Visit (HOSPITAL_BASED_OUTPATIENT_CLINIC_OR_DEPARTMENT_OTHER): Payer: Self-pay

## 2023-11-20 MED ORDER — FLUZONE 0.5 ML IM SUSY
0.5000 mL | PREFILLED_SYRINGE | Freq: Once | INTRAMUSCULAR | 0 refills | Status: AC
  Filled 2023-11-20: qty 0.5, 1d supply, fill #0

## 2023-11-25 ENCOUNTER — Other Ambulatory Visit: Payer: Self-pay

## 2023-11-29 ENCOUNTER — Other Ambulatory Visit (HOSPITAL_COMMUNITY): Payer: Self-pay

## 2023-11-30 NOTE — Progress Notes (Unsigned)
 GI Office Note    Referring Provider: Marvine Rush, MD Primary Care Physician:  Marvine Rush, MD Primary Gastroenterologist: Carlin POUR. Cindie, DO  Date:  12/01/2023  ID:  Kyle Giles, DOB 14-Dec-1960, MRN [id number]  Chief Complaint   Chief Complaint  Patient presents with   Follow-up    Follow up. No problems    History of Present Illness  Kyle Giles is a 63 y.o. male with a history of  chronic pancreatitis c/b pseudocyst and stent placement, hepatic steatosis, anxiety, HTN, and diabetes  presenting today for follow-up of chronic pancreatitis with EPI and reflux.  Colonoscopy in 2021 by Dr. Mavis which showed 1 hyperplastic polyp.   Hospital admission in March 2023 for acute pancreatitis.  Felt to be alcohol induced as patient was simply drinking with 6 beers as well as liquor daily.  CT imaging showed fatty liver and with mention of duodenitis likely secondary to his pancreatitis.  Denied NSAID use.SABRA  Has some abnormal LFTs during hospitalization.  Viral hepatitis panel negative.  Improved shortly with conservative measures and was discharged home.  Recovered well but then started having recurrent epigastric pain which prompted an ER visit in April 2023 which she was discharged home.  He had began taking Nexium  daily.   EGD July 2023: -Normal esophagus, stomach, and duodenum -Continue Nexium  20 mg daily   Hospitalized 01/02/2022 - 01/05/22 for acute on chronic pancreatitis. Presenting with 4 days of abdominal pain and advised to proceed to the hospital with recommendation of his PCP regarding high lipase and AKI.  He denied any recent medication changes.  Prior workup with normal triglycerides and calcium .  At this time he also had acute kidney injury.  Creatinine 3.6 on admission.  Likely due to dehydration.  CT imaging as outlined below.  Advised to start taking pancreatic enzymes once able to tolerate solids (Creon  48,000 units with meals and 24,000 units with snacks).   Also recommended complete alcohol cessation.  Outpatient follow-up in 4 weeks.  Dedicated pancreatic CT in 6-8 weeks.   CT A/P 01/02/2022: -5.7 cm cystic lesion in tail of pancreas likely pseudocyst -Peripancreatic fluid seen -Peripancreatic fluid and stranding extending from the pseudocyst to the lesser curvature of the stomach -No free air to suggest perforation -Mild prominence of main pancreatic duct increased from prior now measuring approximately 5 mm -Coarse calcifications in the pancreatic head -Hepatobiliary system within normal limits -Indistinctness of the wall of the lesser curvature of the stomach which appears, confluent with peripancreatic stranding. -Extensive colonic diverticulosis without diverticulitis   Patient seen in the office 02/13/2022 and 03/05/2022.  At the beginning of January his GERD was fairly well-controlled.  Was having significant major fatigue and falling asleep during the day.  Recently resigned from his job.  Had a good appetite and no weight loss.  Also not experiencing any diarrhea, primarily having constipation using MiraLAX .  Denied any melena or BRBPR.  Had occasional abdominal pain but also having a lot more gas.  Occasional NSAID use.  Denied any pica.  Taking vitamin D .  Advised to check CBC, iron panel, B12, folate, and vitamin A, D, E and K.  He was scheduled for CT pancreatic protocol.  Refilled Nexium .  Advised to continue MiraLAX  as needed.  At the end of January patient reported having tenderness vomiting a CT scan then had more extensive severe pain that night at dinner.  Reducing to a blander diet was helping.  Had a couple days without pain  but then pain returned intermittently.  He denies any nausea or vomiting and has been drinking more water .  Also complaining of pain to his left groin area stating a possible hernia that had grown in size.  On exam he did have a golf ball sized soft tissue mass that was nontender.  He was referred to general surgery  to discuss hernia repair.  He was referred to Dr. Wilhelmenia for EUS and possible cyst gastrostomy.  As stated below MRI/MRCP ordered per his recommendation.   CT A/P with and without contrast 02/26/22: -Interval enlargement of unilocular cystic lesion along the body of the pancreas most consistent with pancreatic pseudocyst -No acute pancreatic inflammation -Ductus divisum variant ductal anatomy with calcification at the orifice of the accessory duct.  No significant ductal dilation currently.   Per Dr. Wilhelmenia fluid collection appears to be large enough to drain which can be done via endoscopic ultrasound  given his pain returning this would likely be best. He has some pancreatic anatomy which can put him at risk for recurrent pancreatitis (this is called pancreatic divisum) and further evaluation can be performed with an MRI/MRCP that would allow a better look at his anatomy for Dr. Wilhelmenia to discuss possible treatment options.    MRI/MRCP 03/27/2022: -Decrease size of 6.2 cm pseudocyst adjacent to pancreatic tail -Findings of chronic pancreatitis and pancreas divisum -No evidence of acute pancreatic inflammation. -Advised to keep follow-up with EUS with Dr. Wilhelmenia already planned for the end of March   OV 04/10/22.  Continued intermittent epigastric pain as well as left lower quadrant/inguinal pain.  Has a good appetite.  Symptoms better controlled without overeating.  Still with some weight loss and occasional constipation but no diarrhea.  Continue on Nexium  once daily with some intermittent reflux symptoms.  No alcohol use or NSAIDs.  Advised to keep follow-up with surgery for inguinal hernia repair.  Follow low-fat diet.  Continue PPI once daily.  Focus on protein intake for nutrition.  Monitor weight.   EGD/EUS 05/02/22 EGD impression: - No gross lesions in the entire esophagus. Z- line regular, 40 cm from the incisors. - Erythematous mucosa in the gastric body. No other gross  lesions in the entire stomach. Biopsied. - No gross lesions in the duodenal bulb. - Duodenal deformity in the duodenal sweep ( as noted above query healed groove pancreatitis versus healed ulcer) - Normal major papilla. - Congested duodenal mucosa in the region of the minor papilla ( an area suggestive of a pancreatic duct was noted but the amount of swelling in this region makes finding of the true minor papilla difficult)   EUS impression: - A cystic lesion was seen in the pancreatic body and pancreatic tail. Tissue has not been obtained. However, the endosonographic appearance is consistent with a pancreatic pseudocyst. Cystgastrostomy created ( 15 mm AXIOS with 7 French by 5 cm double- pigtail through it) . - The pancreatic duct had a dilated endosonographic appearance in the pancreatic head and genu of the pancreas. The pancreatic duct measured up to 4 mm in diameter. - Findings suggestive of pancreatic stones were identified in the pancreatic head and accessory pancreatic duct. It was difficult to evaluate the rest of the pancreatic duct as a result of the pseudocyst. - Pancreatic parenchymal abnormalities consisting of lobularity and hyperechoic strands were noted in the pancreatic head and genu of the pancreas. It was difficult to evaluate the rest of the pancreas parenchyma as a result of the pseudocyst. - Hyperechoic material  consistent with sludge was visualized endosonographically in the gallbladder. - No malignant- appearing lymph nodes were visualized in the celiac region ( level 20) , peripancreatic region and porta hepatis region.   Left inguinal hernia repair on 05/13/22.    CT abdomen with contrast 05/31/22: IMPRESSION: 1. Sequela of prior pancreatitis with interval placement of cystogastrostomy stent with resolution of the previously noted pseudocyst in the pancreatic body/tail. 2. Peripheral subcentimeter hypodensity measuring 8 mm in the pancreatic head is new from 03/27/2022 and may  represent a side branch intraductal papillary mucinous neoplasms (IPMN) or small pseudocyst. Consider contrast-enhanced MRI/MRCP in 1 year.   Repeat EGD 06/10/22 with Dr. Wilhelmenia: -salmon colored mucosa to islands suspicious or barrett's s/p biopsy.  -non obstructing schatzki ring -2 cm hiatal hernia -Pre-existing Axios cystogastrostomy stent was noted and removed -Mild gastritis not rebiopsied -Normal duodenum -Recommended follow-up in the clinic to discuss several symptoms with repeat MRI/MRCP in 3-4 months.    KUB 06/12/22: -cystogastrostomy stents no longer seen -chronic calcific pancreatitis   OV 07/03/2022.  Reportedly feeling pretty well compared to prior, his fatigue has improved as well.  He was back doing yard work but stamina was slowly increasing.  He had quit taking Nexium  in March and denied any recent issues with reflux.  Does keep Mylanta at home and had only taken a handful of times.  Had some difficulty with healing from inguinal hernia but overall doing very well.  Denied any nausea, vomiting, or abdominal pain.  Had been using MiraLAX  as needed for constipation.  Advised MRI/MRCP in August 2024 to reevaluate pancreatic abnormality and discuss treatment for pancreatic divisum.  Advised to continue alcohol cessation and low-fat diet.   ED visit 12/26/2022 for abdominal pain.  He was noted to have abdominal tenderness on exam.  Labs revealed a lipase of 107, AST 56, ALT 74, creatinine 1.41, BUN 25, hemoglobin 11.2, platelets 427, UA with glucose and ketones present.  Underwent CT scan as noted below.  He was treated with oxycodone  and advised to clear liquid diet for several days and advised to follow-up with GI.   CT A/P with contrast: -Acute pancreatitis with marked edema and peripancreatic fat stranding with head and body of pancreas -Chronic subcentimeter pseudocyst or IPMN within the inflamed pancreatic head again noted and unchanged -No new organized fluid collection to  suggest abscess or acute pseudocyst -Diffuse colonic diverticulosis without diverticulitis   OV 12/31/22.  Reported some mild constipation and was taking MiraLAX  initially but then switched to Senokot.  Was taking Mylanta only as needed for reflux feeling like his reflux was more controlled.  Noted slow healing from his most recent pancreatitis episode, had been trying to follow necessary diet recommendations.  Discussed continuing with his MRI/MRCP that was coming up.  We discussed need for pancreatic enzymes if he were to continue to have recurrent pancreatitis.   MRI/MRCP 01/31/2023 IMPRESSION: -Chronic pancreatitis, with resolution of previously seen large pseudocyst in the pancreatic tail. No radiographic signs of acute pancreatitis. - Pancreas divisum again noted. -New 2.3 cm and 1.0 cm simple cystic lesions in the pancreatic neck and head, consistent with pseudocysts. Recommend continued follow-up by MRI in 6 months.   Last office visit 03/06/23.  Discussed increasing protein and calorie intake for weight gain.  Advised to schedule MRI in June.  Encouraged adequate hydration and continue Nexium  20 mg once daily.  We discussed pancreatic enzyme therapy and advised that we should consider this if he was unable to increase  his weight with high-protein/calorie diet.   Following with endocrine - last visit 3/27. A1c elevated at 7.1. Noted improvement in kidney and liver function since discontinuation fo metformin . Maintained on Jardiance . Was provided nutrition counseling.   Last office visit 07/08/2023.  Had a rough couple months in April/May with lots of belching, flatulence and pain near the diaphragm as well as some heartburn.  Eating too heavy or greasy foods also worsening symptoms.  Symptoms worse at night.  Had tried modifying his diet without much help, struggling with weight gain.  Trying to do some light snacking.  Having epigastric region pain as well as nausea at times which is short  lasting usually.  Trying to increase his physical activity but also having issues with back pain as well as pain up underneath the left upper quadrant and rib cage.  Also concerning about some inflammation on the right side near his groin with episodes of swelling as well as pain that only improves with laying down. MRI/MRCP ordered. Advised weight gain with high protein diet, avoid alcohol, mylanta as needed, lactaid with dairy intake. Increase nexium  to 40 mg nightly. Trial Zenpep  recommended. Call with progress report in 2 weeks. MRI pelvis to eval for inguinal hernia concerns.   MRI abdomen and pelvis w/ and w/o contrast 07/18/23 IMPRESSION: - Findings of chronic pancreatitis and pancreas divisum. No definite signs of acute pancreatitis. - Two previously seen peripancreatic fluid collections have decreased in size since previous study. - Two new peripancreatic fluid collections in the gastrohepatic ligament and lesser sac, consistent with pseudocysts. (4.3 x 2.8 cm and 2.3 x 1.6 cm ) - Colonic diverticulosis, without signs of diverticulitis.  Discussed case with Dr. Wilhelmenia and he agreed with PERT and advised he could perform another EUS and possible FNA of cysts. Patient requested to proceed.   Today:  Discussed the use of AI scribe software for clinical note transcription with the patient, who gave verbal consent to proceed.  He has gained approximately ten pounds since starting Zenpep , attributing this to improved nutrient absorption. Previously, he was unable to gain weight despite consuming shakes and a high-protein diet.  No further weight loss. Currently going well overall with his diet. No dairrhea. Occasional constipation which he is managing with Miralax  as needed.   He continues to experience episodes of back and abdominal pain, describing the pain as originating in the back and radiating to the abdomen. The patient reports that an x-ray was performed by an orthopedic specialist, who  told him that no arthritis or other abnormalities were seen. The pain is described as muscular rather than nerve-related and can become severe, as it did in October, prompting him to go on a liquid diet for two days, which alleviated the symptoms. He has not had advanced imaging like a CT scan or MRI for his back pain. He has a family history of scoliosis and arthritis, with his mother having mild scoliosis and significant arthritis.  He experiences episodes of a cyst in the groin area, which becomes painful and swollen. The cyst is only palpable when inflamed, and he manages the pain with muscle relaxers and rest. Following with Dr. Sherrilee for this.   He reports occasional constipation, which he manages with dietary adjustments. He experienced a bout of allergies in August, accompanied by burping and diarrhea, which resolved quickly. He takes Nexium  40 mg daily, which he finds effective, and uses dicyclomine  occasionally for stomach discomfort, though it primarily causes drowsiness. He continues to use  protein shakes and has adjusted his diet to avoid heavy and spicy foods, contributing to his recent weight gain. No ongoing diarrhea or persistent gastrointestinal symptoms.      Wt Readings from Last 6 Encounters:  12/01/23 102 lb 3.2 oz (46.4 kg)  09/03/23 97 lb (44 kg)  07/08/23 92 lb 3.2 oz (41.8 kg)  06/25/23 94 lb (42.6 kg)  05/01/23 97 lb 9.6 oz (44.3 kg)  03/06/23 97 lb 9.6 oz (44.3 kg)    Body mass index is 18.69 kg/m.   Current Outpatient Medications  Medication Sig Dispense Refill   ALPRAZolam  (XANAX ) 1 MG tablet Take 1 tablet (1 mg total) by mouth 3 (three) times daily. 270 tablet 0   ALPRAZolam  (XANAX ) 1 MG tablet Take 1 tablet (1 mg total) by mouth 3 (three) times daily. 270 tablet 0   atorvastatin  (LIPITOR) 10 MG tablet Take 1 tablet (10 mg total) by mouth daily. 90 tablet 3   Cholecalciferol (VITAMIN D ) 50 MCG (2000 UT) tablet Take 2,000 Units by mouth 2 (two) times daily.      Continuous Glucose Sensor (FREESTYLE LIBRE 3 PLUS SENSOR) MISC Checking sugar daily. Change sensor every 15 days 2 each 1   cyclobenzaprine  (FLEXERIL ) 5 MG tablet Take 1 tablet (5 mg total) by mouth 3 (three) times daily as needed for muscle spasms. 30 tablet 5   dicyclomine  (BENTYL ) 10 MG capsule Take 1 capsule (10 mg total) by mouth daily as needed for spasms. 30 capsule 1   diltiazem  (DILACOR XR ) 180 MG 24 hr capsule Take 1 capsule (180 mg total) by mouth daily. 90 capsule 1   empagliflozin  (JARDIANCE ) 10 MG TABS tablet Take 1 tablet (10 mg total) by mouth every morning. 30 tablet 11   esomeprazole  (NEXIUM ) 40 MG capsule Take 1 capsule (40 mg total) by mouth daily before breakfast. 90 capsule 3   fenofibrate  160 MG tablet Take 1 tablet (160 mg total) by mouth daily. 90 tablet 2   olmesartan  (BENICAR ) 20 MG tablet Take 1 tablet (20 mg total) by mouth daily. 30 tablet 11   Pancrelipase , Lip-Prot-Amyl, (ZENPEP ) 40000-126000 units CPEP Take 1 capsule (40,000 Units total) by mouth 3 (three) times daily with meals. 100 capsule 10   Propylene Glycol (SYSTANE COMPLETE) 0.6 % SOLN Place 1 drop into both eyes daily as needed (dry eyes).     traMADol (ULTRAM) 50 MG tablet Take 100 mg by mouth every 6 (six) hours.     No current facility-administered medications for this visit.    Past Medical History:  Diagnosis Date   Anxiety    DM type 2 (diabetes mellitus, type 2) (HCC)    GERD (gastroesophageal reflux disease)    Heart murmur    saw dr bernard 06-29-2019 echo done    Hypercholesterolemia    Hypertension    Palpitations    none in a long time as of 10-19-2019   Pancreatitis    S/P colonoscopy 09/2009   Dr. Mavis: Moderate diverticulosis throughout colon, otherwise normal    Umbilical hernia     Past Surgical History:  Procedure Laterality Date   BALLOON DILATION N/A 05/02/2022   Procedure: BALLOON DILATION;  Surgeon: Wilhelmenia Aloha Raddle., MD;  Location: [facility];  Service:  Gastroenterology;  Laterality: N/A;   BIOPSY  05/02/2022   Procedure: BIOPSY;  Surgeon: Wilhelmenia Aloha Raddle., MD;  Location: [facility];  Service: Gastroenterology;;   BIOPSY  06/10/2022   Procedure: BIOPSY;  Surgeon: Wilhelmenia Aloha Raddle., MD;  Location: [facility];  Service: Gastroenterology;;   COLONOSCOPY N/A 12/21/2019   Procedure: COLONOSCOPY;  Surgeon: Mavis Anes, MD;  Location: [facility];  Service: Gastroenterology;  Laterality: N/A;   cyst removed from finger  age 51   ESOPHAGOGASTRODUODENOSCOPY (EGD) WITH PROPOFOL  N/A 08/13/2021   Procedure: ESOPHAGOGASTRODUODENOSCOPY (EGD) WITH PROPOFOL ;  Surgeon: Cindie Carlin POUR, DO;  Location: [facility];  Service: Endoscopy;  Laterality: N/A;  1:30pm   ESOPHAGOGASTRODUODENOSCOPY (EGD) WITH PROPOFOL  N/A 05/02/2022   Procedure: ESOPHAGOGASTRODUODENOSCOPY (EGD) WITH PROPOFOL ;  Surgeon: Wilhelmenia Aloha Raddle., MD;  Location: [facility];  Service: Gastroenterology;  Laterality: N/A;   ESOPHAGOGASTRODUODENOSCOPY (EGD) WITH PROPOFOL  N/A 06/10/2022   Procedure: ESOPHAGOGASTRODUODENOSCOPY (EGD) WITH PROPOFOL ;  Surgeon: Wilhelmenia Aloha Raddle., MD;  Location: [facility];  Service: Gastroenterology;  Laterality: N/A;   EUS N/A 05/02/2022   Procedure: UPPER ENDOSCOPIC ULTRASOUND (EUS) RADIAL;  Surgeon: Wilhelmenia Aloha Raddle., MD;  Location: [facility];  Service: Gastroenterology;  Laterality: N/A;   HERNIA REPAIR     as baby   PANCREATIC STENT PLACEMENT  05/02/2022   Procedure: CYSTGASTROSTOMY;  Surgeon: Mansouraty, Aloha Raddle., MD;  Location: [facility];  Service: Gastroenterology;;   POLYPECTOMY  12/21/2019   Procedure: POLYPECTOMY;  Surgeon: Mavis Anes, MD;  Location: [facility];  Service: Gastroenterology;;   pyloric stenosis repair     as baby, operated at [facility] REMOVAL  06/10/2022   Procedure: STENT REMOVAL;  Surgeon: Wilhelmenia Aloha Raddle., MD;  Location: [facility];  Service: Gastroenterology;;   UMBILICAL  HERNIA REPAIR N/A 10/29/2019   Procedure: OPEN UMBILICAL HERNIA REPAIR WITH MESH;  Surgeon: Signe Mitzie LABOR, MD;  Location: [facility];  Service: General;  Laterality: N/A;   XI ROBOTIC ASSISTED INGUINAL HERNIA REPAIR WITH MESH Left 05/13/2022   Procedure: XI ROBOTIC ASSISTED INGUINAL HERNIA REPAIR WITH MESH;  Surgeon: Evonnie Dorothyann LABOR, DO;  Location: [facility];  Service: General;  Laterality: Left;    Family History  Problem Relation Age of Onset   Colon cancer Neg Hx     Allergies as of 12/01/2023   (No Known Allergies)    Social History   Socioeconomic History   Marital status: Married    Spouse name: Not on file   Number of children: 2   Years of education: Not on file   Highest education level: Not on file  Occupational History   Occupation: Higher Education Careers Adviser: [employer]    Comment: Kyle Giles  Tobacco Use   Smoking status: Every Day    Current packs/day: 1.50    Average packs/day: 1.5 packs/day for 30.0 years (45.0 ttl pk-yrs)    Types: Cigarettes    Passive exposure: Current   Smokeless tobacco: Never   Tobacco comments:    1 ppd since 3 weeks as of 10-19-2019  Vaping Use   Vaping status: Never Used  Substance and Sexual Activity   Alcohol use: Not Currently    Comment: quit 05/30/21   Drug use: Not Currently   Sexual activity: Not on file  Other Topics Concern   Not on file  Social History Narrative   Not on file   Social Drivers of Health   Financial Resource Strain: Not on file  Food Insecurity: No Food Insecurity (01/02/2022)   Hunger Vital Sign    Worried About Running Out of Food in the Last Year: Never true    Ran Out of Food in the Last Year: Never true  Transportation  Needs: No Transportation Needs (01/02/2022)   PRAPARE - Administrator, Civil Service (Medical): No    Lack of Transportation (Non-Medical): No  Physical Activity: Not on file  Stress: Not on file  Social Connections: Not on  file   Review of Systems   Gen: Denies fever, chills, anorexia. Denies fatigue, weakness, weight loss.  CV: Denies chest pain, palpitations, syncope, peripheral edema, and claudication. Resp: Denies dyspnea at rest, cough, wheezing, coughing up blood, and pleurisy. GI: See HPI Derm: Denies rash, itching, dry skin Psych: Denies depression, anxiety, memory loss, confusion. No homicidal or suicidal ideation.  Heme: Denies bruising, bleeding, and enlarged lymph nodes. + back pain.   Physical Exam   BP 115/68 (BP Location: Right Arm, Patient Position: Sitting, Cuff Size: Normal)   Pulse 84   Temp 98 F (36.7 C) (Temporal)   Ht 5' 2 (1.575 m)   Wt 102 lb 3.2 oz (46.4 kg)   BMI 18.69 kg/m   General:   Alert and oriented. No distress noted. Pleasant and cooperative.  Head:  Normocephalic and atraumatic. Eyes:  Conjuctiva clear without scleral icterus. Glasses Mouth:  Oral mucosa pink and moist. Good dentition. No lesions. Abdomen:  +BS, soft, non-tender and non-distended, flat. No rebound or guarding. No HSM or masses noted. Rectal: deferred Msk:  Symmetrical without gross deformities. Normal posture. Extremities:  Without edema. Neurologic:  Alert and  oriented x4 Psych:  Alert and cooperative. Normal mood and affect.  Assessment & Plan  Kyle Giles is a 63 y.o. male presenting today for follow-up of reflux, chronic pancreatitis, and right groin pain.     Chronic pancreatitis with exocrine pancreatic insufficiency and pancreatic pseudocyst Chronic pancreatitis with episodes of back and abdominal pain, possibly due to pancreatic pseudocysts causing pressure and discomfort. Zenpep  has been effective in aiding weight gain and digestion without side effects. Pain episodes may be related to inflammation or pseudocysts. History of EUS in March 2024 with creation of cyst gastrostomy for drainage of prior large pancreatic pseudocyst. MRI/MRCP in June with stability of original pseudocyst  with 2 new peripancreatic fluid collections present.  Case discussed with Dr. Wilhelmenia previously who agreed upon repeat evaluation with EUS for 3-4 months from time of imaging.  Currently due to call them to complete EUS.  - Encourage scheduling EUS with [facility] for further evaluation. - Discuss Zenpep  versus Creon  with insurance advisor for cost-effective coverage. - Continue Zenpep  as it aids in weight gain and digestion. - Continue nutritional shakes  Constipation Intermittent constipation possibly related to Zenpep  use and dietary changes, with occasional need for stool softeners or laxatives. - Use stool softeners or Miralax  as needed during episodes of constipation.  Recurrent pelvic cyst or hydrocele with episodic inflammation and pain Recurrent pelvic cyst with episodic inflammation causing significant pain, managed with muscle relaxers and rest. Ice is ineffective. Pain occurs unpredictably, often outside of office hours. - Follow up with urology on December 3rd. - Consider ER visit for fast-track intervention if pain occurs during urologists operating days. - Use muscle relaxers and rest during episodes of pain as recommended by urology.  Chronic musculoskeletal back pain Chronic musculoskeletal back pain exacerbated by prolonged sitting or riding, radiating to the abdomen. No significant findings on basic x-ray. Pain possibly related to musculoskeletal issues or scoliosis. - Consider spine specialist referral if pain worsens. - Manage pain with tramadol and avoid Advil due to kidney concerns.  GERD with belching Previously with uncontrolled symptoms on  Nexium  20 mg daily with nocturnal symptoms as well as frequent belching and upper abdominal discomfort however since increase of Nexium  40 mg has been doing very well and symptoms controlled without significant breakthrough, only had 1 episode possibly dietary related for which Mylanta was helpful. - Continue nexium  40 mg once  daily.  - GERD diet      Follow up   Follow up 6 months  Advised him to call [facility] to schedule EUS.   Charmaine Melia, MSN, FNP-BC, AGACNP-BC [facility]

## 2023-12-01 ENCOUNTER — Encounter: Payer: Self-pay | Admitting: Gastroenterology

## 2023-12-01 ENCOUNTER — Other Ambulatory Visit (HOSPITAL_COMMUNITY): Payer: Self-pay

## 2023-12-01 ENCOUNTER — Ambulatory Visit: Admitting: Gastroenterology

## 2023-12-01 VITALS — BP 115/68 | HR 84 | Temp 98.0°F | Ht 62.0 in | Wt 102.2 lb

## 2023-12-01 DIAGNOSIS — K863 Pseudocyst of pancreas: Secondary | ICD-10-CM | POA: Diagnosis not present

## 2023-12-01 DIAGNOSIS — K8681 Exocrine pancreatic insufficiency: Secondary | ICD-10-CM

## 2023-12-01 DIAGNOSIS — K861 Other chronic pancreatitis: Secondary | ICD-10-CM | POA: Diagnosis not present

## 2023-12-01 DIAGNOSIS — N433 Hydrocele, unspecified: Secondary | ICD-10-CM | POA: Diagnosis not present

## 2023-12-01 DIAGNOSIS — G8929 Other chronic pain: Secondary | ICD-10-CM

## 2023-12-01 DIAGNOSIS — K219 Gastro-esophageal reflux disease without esophagitis: Secondary | ICD-10-CM

## 2023-12-01 DIAGNOSIS — K5900 Constipation, unspecified: Secondary | ICD-10-CM | POA: Diagnosis not present

## 2023-12-01 DIAGNOSIS — Q453 Other congenital malformations of pancreas and pancreatic duct: Secondary | ICD-10-CM | POA: Diagnosis not present

## 2023-12-01 DIAGNOSIS — R142 Eructation: Secondary | ICD-10-CM | POA: Diagnosis not present

## 2023-12-01 DIAGNOSIS — M546 Pain in thoracic spine: Secondary | ICD-10-CM | POA: Diagnosis not present

## 2023-12-01 DIAGNOSIS — R634 Abnormal weight loss: Secondary | ICD-10-CM | POA: Diagnosis not present

## 2023-12-01 NOTE — Patient Instructions (Addendum)
 Continue Zenpep  to help with chronic pancreatitis and aid in weight gain. Creon  is the other pancreatic enzyme replacement alternatives that you can use and you can discuss this with your insurance provider to see if this is a better or cheaper option.  To help with your medication cost, you should ask your insurance provider or lady with Social Security about the Medicare prescription payment plan  Use MiraLAX  and/or stool softener as needed for constipation.  You may use dicyclomine  also as needed for any severe abdominal cramping or pain.  Continue Nexium  40 mg once daily.  Continue to use Mylanta as needed for breakthrough symptoms.  Follow a GERD diet:  Avoid fried, fatty, greasy, spicy, citrus foods. Avoid caffeine and carbonated beverages. Avoid chocolate. Try eating 4-6 small meals a day rather than 3 large meals. Do not eat within 3 hours of laying down. Prop head of bed up on wood or bricks to create a 6 inch incline.  Please reach back out to [facility] to discuss the EUS with Dr. Wilhelmenia.  Please let me know if they will not schedule you or giving you any pushback and I will reach back out to them.  Follow up in 6 months.   It was a pleasure to see you today. I want to create trusting relationships with patients. If you receive a survey regarding your visit,  I greatly appreciate you taking time to fill this out on paper or through your MyChart. I value your feedback.  Charmaine Melia, MSN, FNP-BC, AGACNP-BC [facility]

## 2023-12-03 ENCOUNTER — Other Ambulatory Visit: Payer: Self-pay

## 2023-12-04 ENCOUNTER — Other Ambulatory Visit (HOSPITAL_BASED_OUTPATIENT_CLINIC_OR_DEPARTMENT_OTHER): Payer: Self-pay

## 2023-12-04 ENCOUNTER — Other Ambulatory Visit (HOSPITAL_COMMUNITY): Payer: Self-pay

## 2023-12-04 MED ORDER — DILTIAZEM HCL ER 180 MG PO CP24
180.0000 mg | ORAL_CAPSULE | Freq: Every day | ORAL | 0 refills | Status: DC
  Filled 2023-12-04: qty 90, 90d supply, fill #0

## 2023-12-06 ENCOUNTER — Other Ambulatory Visit (HOSPITAL_COMMUNITY): Payer: Self-pay

## 2023-12-08 ENCOUNTER — Other Ambulatory Visit (HOSPITAL_COMMUNITY): Payer: Self-pay

## 2023-12-08 DIAGNOSIS — K861 Other chronic pancreatitis: Secondary | ICD-10-CM | POA: Diagnosis not present

## 2023-12-08 DIAGNOSIS — G8929 Other chronic pain: Secondary | ICD-10-CM | POA: Diagnosis not present

## 2023-12-08 DIAGNOSIS — F411 Generalized anxiety disorder: Secondary | ICD-10-CM | POA: Diagnosis not present

## 2023-12-08 DIAGNOSIS — E785 Hyperlipidemia, unspecified: Secondary | ICD-10-CM | POA: Diagnosis not present

## 2023-12-08 DIAGNOSIS — E119 Type 2 diabetes mellitus without complications: Secondary | ICD-10-CM | POA: Diagnosis not present

## 2023-12-08 DIAGNOSIS — I10 Essential (primary) hypertension: Secondary | ICD-10-CM | POA: Diagnosis not present

## 2023-12-08 MED ORDER — OLMESARTAN MEDOXOMIL 20 MG PO TABS
20.0000 mg | ORAL_TABLET | Freq: Every day | ORAL | 1 refills | Status: AC
  Filled 2024-01-22: qty 90, 90d supply, fill #0
  Filled ????-??-??: fill #0

## 2023-12-08 MED ORDER — FENOFIBRATE 160 MG PO TABS
160.0000 mg | ORAL_TABLET | Freq: Every day | ORAL | 1 refills | Status: AC
  Filled 2023-12-08: qty 90, 90d supply, fill #0

## 2023-12-08 MED ORDER — DILTIAZEM HCL ER 180 MG PO CP24
180.0000 mg | ORAL_CAPSULE | Freq: Every morning | ORAL | 1 refills | Status: AC
  Filled 2023-12-08 – 2024-02-22 (×2): qty 90, 90d supply, fill #0

## 2023-12-08 MED ORDER — ATORVASTATIN CALCIUM 10 MG PO TABS
10.0000 mg | ORAL_TABLET | Freq: Every day | ORAL | 1 refills | Status: AC
  Filled 2023-12-08 – 2024-01-13 (×2): qty 90, 90d supply, fill #0

## 2023-12-09 ENCOUNTER — Other Ambulatory Visit: Payer: Self-pay

## 2023-12-09 ENCOUNTER — Other Ambulatory Visit (HOSPITAL_COMMUNITY): Payer: Self-pay

## 2023-12-11 ENCOUNTER — Other Ambulatory Visit: Payer: Self-pay

## 2023-12-15 ENCOUNTER — Other Ambulatory Visit: Payer: Self-pay

## 2023-12-16 ENCOUNTER — Other Ambulatory Visit: Payer: Self-pay

## 2023-12-18 ENCOUNTER — Other Ambulatory Visit (HOSPITAL_COMMUNITY): Payer: Self-pay

## 2023-12-19 ENCOUNTER — Other Ambulatory Visit (HOSPITAL_COMMUNITY): Payer: Self-pay

## 2023-12-19 MED ORDER — FREESTYLE LIBRE 3 PLUS SENSOR MISC
1 refills | Status: AC
  Filled 2023-12-19: qty 2, 30d supply, fill #0

## 2023-12-22 ENCOUNTER — Other Ambulatory Visit (HOSPITAL_COMMUNITY): Payer: Self-pay

## 2023-12-22 MED ORDER — FREESTYLE LIBRE 3 PLUS SENSOR MISC
1.0000 | 11 refills | Status: AC
  Filled 2023-12-22 – 2024-01-13 (×2): qty 2, 30d supply, fill #0
  Filled 2024-01-22: qty 2, 30d supply, fill #1

## 2023-12-24 DIAGNOSIS — J209 Acute bronchitis, unspecified: Secondary | ICD-10-CM | POA: Diagnosis not present

## 2023-12-24 DIAGNOSIS — R051 Acute cough: Secondary | ICD-10-CM | POA: Diagnosis not present

## 2023-12-26 ENCOUNTER — Other Ambulatory Visit (HOSPITAL_COMMUNITY): Payer: Self-pay

## 2023-12-26 ENCOUNTER — Other Ambulatory Visit (HOSPITAL_BASED_OUTPATIENT_CLINIC_OR_DEPARTMENT_OTHER): Payer: Self-pay

## 2023-12-26 ENCOUNTER — Telehealth: Payer: Self-pay

## 2023-12-26 ENCOUNTER — Encounter: Payer: Self-pay | Admitting: Urology

## 2023-12-26 DIAGNOSIS — L720 Epidermal cyst: Secondary | ICD-10-CM

## 2023-12-26 NOTE — Telephone Encounter (Signed)
 Open in error

## 2023-12-26 NOTE — Telephone Encounter (Signed)
Please review below and advise

## 2023-12-26 NOTE — Telephone Encounter (Signed)
 Pt called stating his has a cyst on his groin on that MD McKenzie told him to call our office the day it flares for MD to drain per verbal from MD McKenzie  have pt take a photo and send to us  so I can know where it is . Pt made aware and stated he will do that now

## 2023-12-26 NOTE — Telephone Encounter (Signed)
 Called pt to let him know per verbal from MD McKenzie  pt has an Epidermoid cyst of skin of inguinal region please refer them to general surgeon. Pt made aware referral was placed

## 2023-12-26 NOTE — Addendum Note (Signed)
 Addended by: SAMMIE EXIE HERO on: 12/26/2023 02:06 PM   Modules accepted: Orders

## 2023-12-26 NOTE — Telephone Encounter (Signed)
 Patient left a voice message 12-26-2023.  Having a cyst flair up. Was told to call and possibly have the cyst drained during business hours.  Please advise.  Call:  [phone].

## 2024-01-05 ENCOUNTER — Other Ambulatory Visit (HOSPITAL_COMMUNITY): Payer: Self-pay

## 2024-01-05 ENCOUNTER — Encounter: Payer: Self-pay | Admitting: Nurse Practitioner

## 2024-01-05 ENCOUNTER — Ambulatory Visit (INDEPENDENT_AMBULATORY_CARE_PROVIDER_SITE_OTHER): Admitting: Nurse Practitioner

## 2024-01-05 ENCOUNTER — Ambulatory Visit: Admitting: Nurse Practitioner

## 2024-01-05 VITALS — BP 108/60 | HR 83 | Ht 62.0 in | Wt 98.4 lb

## 2024-01-05 DIAGNOSIS — I10 Essential (primary) hypertension: Secondary | ICD-10-CM

## 2024-01-05 DIAGNOSIS — E782 Mixed hyperlipidemia: Secondary | ICD-10-CM | POA: Diagnosis not present

## 2024-01-05 DIAGNOSIS — N182 Chronic kidney disease, stage 2 (mild): Secondary | ICD-10-CM

## 2024-01-05 DIAGNOSIS — E1122 Type 2 diabetes mellitus with diabetic chronic kidney disease: Secondary | ICD-10-CM | POA: Diagnosis not present

## 2024-01-05 DIAGNOSIS — Z7984 Long term (current) use of oral hypoglycemic drugs: Secondary | ICD-10-CM | POA: Diagnosis not present

## 2024-01-05 LAB — POCT GLYCOSYLATED HEMOGLOBIN (HGB A1C): Hemoglobin A1C: 7.7 % — AB (ref 4.0–5.6)

## 2024-01-05 MED ORDER — ALBUTEROL SULFATE HFA 108 (90 BASE) MCG/ACT IN AERS
2.0000 | INHALATION_SPRAY | RESPIRATORY_TRACT | 3 refills | Status: AC
  Filled 2024-01-05: qty 6.7, 16d supply, fill #0
  Filled 2024-01-06 (×2): qty 6.7, 17d supply, fill #0
  Filled 2024-01-22: qty 6.7, 17d supply, fill #1
  Filled 2024-03-08: qty 6.7, 17d supply, fill #2

## 2024-01-05 NOTE — Progress Notes (Signed)
 Endocrinology Follow Up Note       01/05/2024, 2:48 PM   Subjective:    Patient ID: Kyle Giles, male    DOB: 1960/10/18.  Kyle Giles is being seen in follow up after being seen in consultation for management of currently uncontrolled symptomatic diabetes requested by  Marvine Rush, MD.   Past Medical History:  Diagnosis Date   Anxiety    DM type 2 (diabetes mellitus, type 2) (HCC)    GERD (gastroesophageal reflux disease)    Heart murmur    saw dr bernard 06-29-2019 echo done    Hypercholesterolemia    Hypertension    Palpitations    none in a long time as of 10-19-2019   Pancreatitis    S/P colonoscopy 09/2009   Dr. Mavis: Moderate diverticulosis throughout colon, otherwise normal    Umbilical hernia     Past Surgical History:  Procedure Laterality Date   BALLOON DILATION N/A 05/02/2022   Procedure: BALLOON DILATION;  Surgeon: Wilhelmenia Aloha Raddle., MD;  Location: [facility];  Service: Gastroenterology;  Laterality: N/A;   BIOPSY  05/02/2022   Procedure: BIOPSY;  Surgeon: Wilhelmenia Aloha Raddle., MD;  Location: [facility];  Service: Gastroenterology;;   BIOPSY  06/10/2022   Procedure: BIOPSY;  Surgeon: Wilhelmenia Aloha Raddle., MD;  Location: [facility];  Service: Gastroenterology;;   COLONOSCOPY N/A 12/21/2019   Procedure: COLONOSCOPY;  Surgeon: Mavis Anes, MD;  [facility];  Service: Gastroenterology;  Laterality: N/A;   cyst removed from finger  age 35   ESOPHAGOGASTRODUODENOSCOPY (EGD) WITH PROPOFOL  N/A 08/13/2021   Procedure: ESOPHAGOGASTRODUODENOSCOPY (EGD) WITH PROPOFOL ;  Surgeon: Cindie Carlin POUR, DO;  [facility];  Service: Endoscopy;  Laterality: N/A;  1:30pm   ESOPHAGOGASTRODUODENOSCOPY (EGD) WITH PROPOFOL  N/A 05/02/2022   Procedure: ESOPHAGOGASTRODUODENOSCOPY (EGD) WITH PROPOFOL ;  Surgeon: Wilhelmenia Aloha Raddle., MD;  Location: [facility];   Service: Gastroenterology;  Laterality: N/A;   ESOPHAGOGASTRODUODENOSCOPY (EGD) WITH PROPOFOL  N/A 06/10/2022   Procedure: ESOPHAGOGASTRODUODENOSCOPY (EGD) WITH PROPOFOL ;  Surgeon: Wilhelmenia Aloha Raddle., MD;  Location: [facility];  Service: Gastroenterology;  Laterality: N/A;   EUS N/A 05/02/2022   Procedure: UPPER ENDOSCOPIC ULTRASOUND (EUS) RADIAL;  Surgeon: Wilhelmenia Aloha Raddle., MD;  Location: [facility];  Service: Gastroenterology;  Laterality: N/A;   HERNIA REPAIR     as baby   PANCREATIC STENT PLACEMENT  05/02/2022   Procedure: CYSTGASTROSTOMY;  Surgeon: Mansouraty, Aloha Raddle., MD;  Location: [facility];  Service: Gastroenterology;;   POLYPECTOMY  12/21/2019   Procedure: POLYPECTOMY;  Surgeon: Mavis Anes, MD;  [facility];  Service: Gastroenterology;;   pyloric stenosis repair     as baby, operated at [facility] REMOVAL  06/10/2022   Procedure: STENT REMOVAL;  Surgeon: Wilhelmenia Aloha Raddle., MD;  Location: [facility];  Service: Gastroenterology;;   UMBILICAL HERNIA REPAIR N/A 10/29/2019   Procedure: OPEN UMBILICAL HERNIA REPAIR WITH MESH;  Surgeon: Signe Mitzie DELENA, MD;  Location: [facility];  Service: General;  Laterality: N/A;   XI ROBOTIC ASSISTED INGUINAL HERNIA REPAIR WITH MESH Left 05/13/2022   Procedure: XI ROBOTIC ASSISTED INGUINAL HERNIA REPAIR WITH MESH;  Surgeon: Evonnie Dorothyann DELENA, DO;  Location: [facility];  Service: General;  Laterality: Left;    Social History   Socioeconomic History   Marital status: Married    Spouse name: Not on file   Number of children: 2   Years of education: Not on file   Highest education level: Not on file  Occupational History   Occupation: Higher Education Careers Adviser: [employer]    Comment: [employer]  Tobacco Use   Smoking status: Every Day    Current packs/day: 1.50    Average packs/day: 1.5 packs/day for 30.0 years (45.0 ttl pk-yrs)    Types: Cigarettes    Passive exposure:  Current   Smokeless tobacco: Never   Tobacco comments:    1 ppd since 3 weeks as of 10-19-2019  Vaping Use   Vaping status: Never Used  Substance and Sexual Activity   Alcohol use: Not Currently    Comment: quit 05/30/21   Drug use: Not Currently   Sexual activity: Not on file  Other Topics Concern   Not on file  Social History Narrative   Not on file   Social Drivers of Health   Financial Resource Strain: Not on file  Food Insecurity: No Food Insecurity (01/02/2022)   Hunger Vital Sign    Worried About Running Out of Food in the Last Year: Never true    Ran Out of Food in the Last Year: Never true  Transportation Needs: No Transportation Needs (01/02/2022)   PRAPARE - Administrator, Civil Service (Medical): No    Lack of Transportation (Non-Medical): No  Physical Activity: Not on file  Stress: Not on file  Social Connections: Not on file    Family History  Problem Relation Age of Onset   Colon cancer Neg Hx     Outpatient Encounter Medications as of 01/05/2024  Medication Sig   albuterol  (VENTOLIN  HFA) 108 (90 Base) MCG/ACT inhaler Inhale 2 puffs into the lungs every 4 (four) hours.   ALPRAZolam  (XANAX ) 1 MG tablet Take 1 tablet (1 mg total) by mouth 3 (three) times daily.   ALPRAZolam  (XANAX ) 1 MG tablet Take 1 tablet (1 mg total) by mouth 3 (three) times daily.   atorvastatin  (LIPITOR) 10 MG tablet Take 1 tablet (10 mg total) by mouth daily.   Cholecalciferol (VITAMIN D ) 50 MCG (2000 UT) tablet Take 2,000 Units by mouth 2 (two) times daily.   Continuous Glucose Sensor (FREESTYLE LIBRE 3 PLUS SENSOR) MISC Use to check blood glucose continuously. Apply 1 new sensor every 15 days   Continuous Glucose Sensor (FREESTYLE LIBRE 3 PLUS SENSOR) MISC apply new sensor every 15 days   cyclobenzaprine  (FLEXERIL ) 5 MG tablet Take 1 tablet (5 mg total) by mouth 3 (three) times daily as needed for muscle spasms.   dicyclomine  (BENTYL ) 10 MG capsule Take 1 capsule (10 mg  total) by mouth daily as needed for spasms.   diltiazem  (DILACOR XR ) 180 MG 24 hr capsule Take 1 capsule (180 mg total) by mouth every morning on an empty stomach.   empagliflozin  (JARDIANCE ) 10 MG TABS tablet Take 1 tablet (10 mg total) by mouth every morning.   esomeprazole  (NEXIUM ) 40 MG capsule Take 1 capsule (40 mg total) by mouth daily before breakfast.   fenofibrate  160 MG tablet Take 1 tablet (160 mg total) by mouth daily.   fenofibrate  160 MG tablet Take 1 tablet (160 mg total) by mouth daily.   olmesartan  (BENICAR ) 20 MG tablet Take 1 tablet (20  mg total) by mouth daily.   Pancrelipase , Lip-Prot-Amyl, (ZENPEP ) 40000-126000 units CPEP Take 1 capsule (40,000 Units total) by mouth 3 (three) times daily with meals.   Propylene Glycol (SYSTANE COMPLETE) 0.6 % SOLN Place 1 drop into both eyes daily as needed (dry eyes).   traMADol (ULTRAM) 50 MG tablet Take 100 mg by mouth every 6 (six) hours.   No facility-administered encounter medications on file as of 01/05/2024.    ALLERGIES: No Known Allergies  VACCINATION STATUS: Immunization History  Administered Date(s) Administered   Influenza, Seasonal, Injecte, Preservative Fre 11/20/2023   Moderna Sars-Covid-2 Vaccination 04/10/2019, 05/12/2019    Diabetes He presents for his follow-up diabetic visit. He has type 2 diabetes mellitus. Onset time: diagnosed at approx age of 5. His disease course has been stable. There are no hypoglycemic associated symptoms. Associated symptoms include polyuria. Pertinent negatives for diabetes include no weight loss. There are no hypoglycemic complications. Symptoms are stable. Diabetic complications include nephropathy. (Chronic pancreatitis) Risk factors for coronary artery disease include diabetes mellitus, male sex, hypertension, tobacco exposure, sedentary lifestyle and stress. Current diabetic treatment includes oral agent (monotherapy). He is compliant with treatment most of the time. He is following a  low fat/cholesterol, high fiber and generally healthy diet. Meal planning includes avoidance of concentrated sweets. He has not had a previous visit with a dietitian. He rarely participates in exercise. His home blood glucose trend is fluctuating minimally. His overall blood glucose range is 140-180 mg/dl. (He presents today with his CGM showing at goal glycemic profile.  His POCT A1c today is 7.7%, increasing slightly from last visit of 7.2%.  Analysis of his CGM shows TIR 81%, TAR 19%, TBR 0% with a GMI of 7%.   He notes he had bronchitis about a week ago and had a steroid shot for it, along with cough medications.  His glucose seems to be leveling out at this time.) An ACE inhibitor/angiotensin II receptor blocker is being taken. He does not see a podiatrist.Eye exam is not current.     Review of systems  Constitutional: +minimally fluctuating body weight, current Body mass index is 18 kg/m., no fatigue, no subjective hyperthermia, no subjective hypothermia Eyes: no blurry vision, no xerophthalmia ENT: no sore throat, no nodules palpated in throat, no dysphagia/odynophagia, no hoarseness Cardiovascular: no chest pain, no shortness of breath, no palpitations, no leg swelling Respiratory: no cough, no shortness of breath Gastrointestinal: no nausea/vomiting/diarrhea, Musculoskeletal: + chronic back pain Skin: no rashes, no hyperemia Neurological: no tremors, no numbness, no tingling, no dizziness Psychiatric: no depression, no anxiety  Objective:     BP 108/60 (BP Location: Left Arm, Patient Position: Sitting, Cuff Size: Large)   Pulse 83   Ht 5' 2 (1.575 m)   Wt 98 lb 6.4 oz (44.6 kg)   BMI 18.00 kg/m   Wt Readings from Last 3 Encounters:  01/05/24 98 lb 6.4 oz (44.6 kg)  12/01/23 102 lb 3.2 oz (46.4 kg)  09/03/23 97 lb (44 kg)     BP Readings from Last 3 Encounters:  01/05/24 108/60  12/01/23 115/68  09/03/23 98/70     Physical Exam- Limited  Constitutional:  Body mass  index is 18 kg/m. , not in acute distress, normal state of mind Eyes:  EOMI, no exophthalmos Musculoskeletal: no gross deformities, strength intact in all four extremities, no gross restriction of joint movements Skin:  no rashes, no hyperemia, +slight nicotinic discoloration to fingernails Neurological: no tremor with outstretched hands   Diabetic  Foot Exam - Simple   Simple Foot Form Diabetic Foot exam was performed with the following findings: Yes 01/05/2024  2:10 PM  Visual Inspection No deformities, no ulcerations, no other skin breakdown bilaterally: Yes Sensation Testing Intact to touch and monofilament testing bilaterally: Yes Pulse Check Posterior Tibialis and Dorsalis pulse intact bilaterally: Yes Comments      CMP ( most recent) CMP     Component Value Date/Time   NA 140 01/07/2023 1107   K 4.3 01/07/2023 1107   CL 103 01/07/2023 1107   CO2 22 01/07/2023 1107   GLUCOSE 126 (H) 01/07/2023 1107   GLUCOSE 92 12/26/2022 1228   BUN 18 01/07/2023 1107   CREATININE 1.04 01/07/2023 1107   CREATININE 1.16 07/25/2021 0907   CALCIUM  10.1 01/07/2023 1107   PROT 6.6 01/07/2023 1107   ALBUMIN 4.3 01/07/2023 1107   AST 29 01/07/2023 1107   ALT 43 01/07/2023 1107   ALKPHOS 62 01/07/2023 1107   BILITOT 0.2 01/07/2023 1107   EGFR 80 01/07/2023 1107   GFRNONAA 56 (L) 12/26/2022 1228     Diabetic Labs (most recent): Lab Results  Component Value Date   HGBA1C 7.7 (A) 01/05/2024   HGBA1C 7.2 (A) 09/03/2023   HGBA1C 7.1 (A) 05/01/2023     Lipid Panel ( most recent) Lipid Panel     Component Value Date/Time   CHOL 101 01/03/2022 1200   TRIG 113 01/03/2022 1200   HDL 16 (L) 01/03/2022 1200   CHOLHDL 6.3 01/03/2022 1200   VLDL 23 01/03/2022 1200   LDLCALC 62 01/03/2022 1200      No results found for: TSH, FREET4         Assessment & Plan:   1) Type 2 diabetes mellitus with stage 2 chronic kidney disease, without long-term current use of insulin  (HCC)  (Primary)  He presents today with his CGM showing at goal glycemic profile.  His POCT A1c today is 7.7%, increasing slightly from last visit of 7.2%.  Analysis of his CGM shows TIR 81%, TAR 19%, TBR 0% with a GMI of 7%.   He notes he had bronchitis about a week ago and had a steroid shot for it, along with cough medications.  His glucose seems to be leveling out at this time.  - Kyle Giles has currently uncontrolled symptomatic type 2 DM since 63 years of age.   -Recent labs reviewed.  - I had a long discussion with him about the progressive nature of diabetes and the pathology behind its complications. -his diabetes is complicated by chronic pancreatitis and CKD stage 3 (sees nephrology) and he remains at a high risk for more acute and chronic complications which include CAD, CVA, CKD, retinopathy, and neuropathy. These are all discussed in detail with him.  The following Lifestyle Medicine recommendations according to American College of Lifestyle Medicine [facility]) were discussed and offered to patient and he agrees to start the journey:  A. Whole Foods, Plant-based plate comprising of fruits and vegetables, plant-based proteins, whole-grain carbohydrates was discussed in detail with the patient.   A list for source of those nutrients were also provided to the patient.  Patient will use only water  or unsweetened tea for hydration. B.  The need to stay away from risky substances including alcohol, smoking; obtaining 7 to 9 hours of restorative sleep, at least 150 minutes of moderate intensity exercise Giles, the importance of healthy social connections,  and stress reduction techniques were discussed. C.  A full color page  of  Calorie density of various food groups per pound showing examples of each food groups was provided to the patient.  - Nutritional counseling repeated/built upon at each appointment.  - The patient admits there is a room for improvement in their diet and drink choices. -   Suggestion is made for the patient to avoid simple carbohydrates from their diet including Cakes, Sweet Desserts / Pastries, Ice Cream, Soda (diet and regular), Sweet Tea, Candies, Chips, Cookies, Sweet Pastries, Store Bought Juices, Alcohol in Excess of 1-2 drinks a day, Artificial Sweeteners, Coffee Creamer, and Sugar-free Products. This will help patient to have stable blood glucose profile and potentially avoid unintended weight gain.   - I encouraged the patient to switch to unprocessed or minimally processed complex starch and increased protein intake (animal or plant source), fruits, and vegetables.   - Patient is advised to stick to a routine mealtimes to eat 3 meals a day and avoid unnecessary snacks (to snack only to correct hypoglycemia).  - I have approached him with the following individualized plan to manage his diabetes and patient agrees:   -He is advised to continue his Jardiance  10 mg po daily.  He notes this medication may be too expensive starting next year due to him getting on a Medicare plan.  I did go over financial options for him including spreading out payments, medication grants, and patient assistance programs.  He states he will let me know when time gets closer where he wants me to send the script after he talks with the insurance again.  His previous chronic heavy alcohol use may have damaged his pancreas which may need insulin  treatment in the future, however he is well controlled at this time.  -he is encouraged to continue monitoring glucose once daily maybe 2-3 times per week to keep an eye on glucose.  He does not need to monitor multiple times per day due to safe medication regimen.  - Adjustment parameters are given to him for hypo and hyperglycemia in writing.  - he is not a candidate for full dose Metformin  due to concurrent renal insufficiency.  - he is not a candidate for incretin therapy given chronic pancreatitis and body habitus with BMI of 18.  -  Specific targets for  A1c; LDL, HDL, and Triglycerides were discussed with the patient.  2) Blood Pressure /Hypertension:  his blood pressure is controlled to target.   he is advised to continue his current medications as prescribed by his PCP/nephrologist.  3) Lipids/Hyperlipidemia:    Review of his recent lipid panel from 08/23/22 showed controlled LDL at 97 .  he is advised to continue Atorvastatin  10 mg daily at bedtime.  Side effects and precautions discussed with him.  4)  Weight/Diet:  his Body mass index is 18 kg/m.  -   he is NOT a candidate for weight loss.  Exercise, and detailed carbohydrates information provided  -  detailed on discharge instructions.  5) Chronic Care/Health Maintenance: -he is on ACEI/ARB and Statin medications and is encouraged to initiate and continue to follow up with Ophthalmology, Dentist, Podiatrist at least yearly or according to recommendations, and advised to QUIT SMOKING. I have recommended yearly flu vaccine and pneumonia vaccine at least every 5 years; moderate intensity exercise for up to 150 minutes Giles; and sleep for at least 7 hours a day.  - he is advised to maintain close follow up with Marvine Rush, MD for primary care needs, as well as his other providers  for optimal and coordinated care.     I spent  46  minutes in the care of the patient today including review of labs from CMP, Lipids, Thyroid  Function, Hematology (current and previous including abstractions from other facilities); face-to-face time discussing  his blood glucose readings/logs, discussing hypoglycemia and hyperglycemia episodes and symptoms, medications doses, his options of short and long term treatment based on the latest standards of care / guidelines;  discussion about incorporating lifestyle medicine;  and documenting the encounter. Risk reduction counseling performed per USPSTF guidelines to reduce obesity and cardiovascular risk factors.     Please refer to Patient  Instructions for Blood Glucose Monitoring and Insulin /Medications Dosing Guide  in media tab for additional information. Please  also refer to  Patient Self Inventory in the Media  tab for reviewed elements of pertinent patient history.  Kyle Giles participated in the discussions, expressed understanding, and voiced agreement with the above plans.  All questions were answered to his satisfaction. he is encouraged to contact clinic should he have any questions or concerns prior to his return visit.     Follow up plan: - Return in about 4 months (around 05/05/2024) for Diabetes F/U with A1c in office, No previsit labs, Bring meter and logs.   Benton Rio, [facility] [address] [place], [place] [address] Phone: [phone] Fax: [phone]  01/05/2024, 2:48 PM

## 2024-01-06 ENCOUNTER — Other Ambulatory Visit: Payer: Self-pay

## 2024-01-06 ENCOUNTER — Other Ambulatory Visit (HOSPITAL_COMMUNITY): Payer: Self-pay

## 2024-01-07 ENCOUNTER — Other Ambulatory Visit: Payer: Self-pay

## 2024-01-07 ENCOUNTER — Ambulatory Visit: Admitting: Urology

## 2024-01-09 ENCOUNTER — Other Ambulatory Visit: Payer: Self-pay

## 2024-01-13 ENCOUNTER — Other Ambulatory Visit: Payer: Self-pay

## 2024-01-13 ENCOUNTER — Other Ambulatory Visit (HOSPITAL_COMMUNITY): Payer: Self-pay

## 2024-01-22 ENCOUNTER — Other Ambulatory Visit: Payer: Self-pay

## 2024-01-22 ENCOUNTER — Other Ambulatory Visit: Payer: Self-pay | Admitting: *Deleted

## 2024-01-22 DIAGNOSIS — E782 Mixed hyperlipidemia: Secondary | ICD-10-CM

## 2024-01-22 DIAGNOSIS — Z7984 Long term (current) use of oral hypoglycemic drugs: Secondary | ICD-10-CM

## 2024-01-22 DIAGNOSIS — I10 Essential (primary) hypertension: Secondary | ICD-10-CM

## 2024-01-22 DIAGNOSIS — E1122 Type 2 diabetes mellitus with diabetic chronic kidney disease: Secondary | ICD-10-CM

## 2024-01-22 MED ORDER — EMPAGLIFLOZIN 10 MG PO TABS
10.0000 mg | ORAL_TABLET | Freq: Every morning | ORAL | 1 refills | Status: AC
  Filled 2024-01-31 – 2024-02-03 (×2): qty 90, 90d supply, fill #0

## 2024-01-22 NOTE — Telephone Encounter (Signed)
 A refill was sent in for the patient. To the [facility] pharmacy.

## 2024-01-26 IMAGING — DX DG CHEST 2V
2 series · 2 of 2 positions shown · non-contrast
Comparison: April 04, 2016

CLINICAL DATA: Post covid-94 condition, unspecified

EXAM:
CHEST - 2 VIEW

[chest pa]
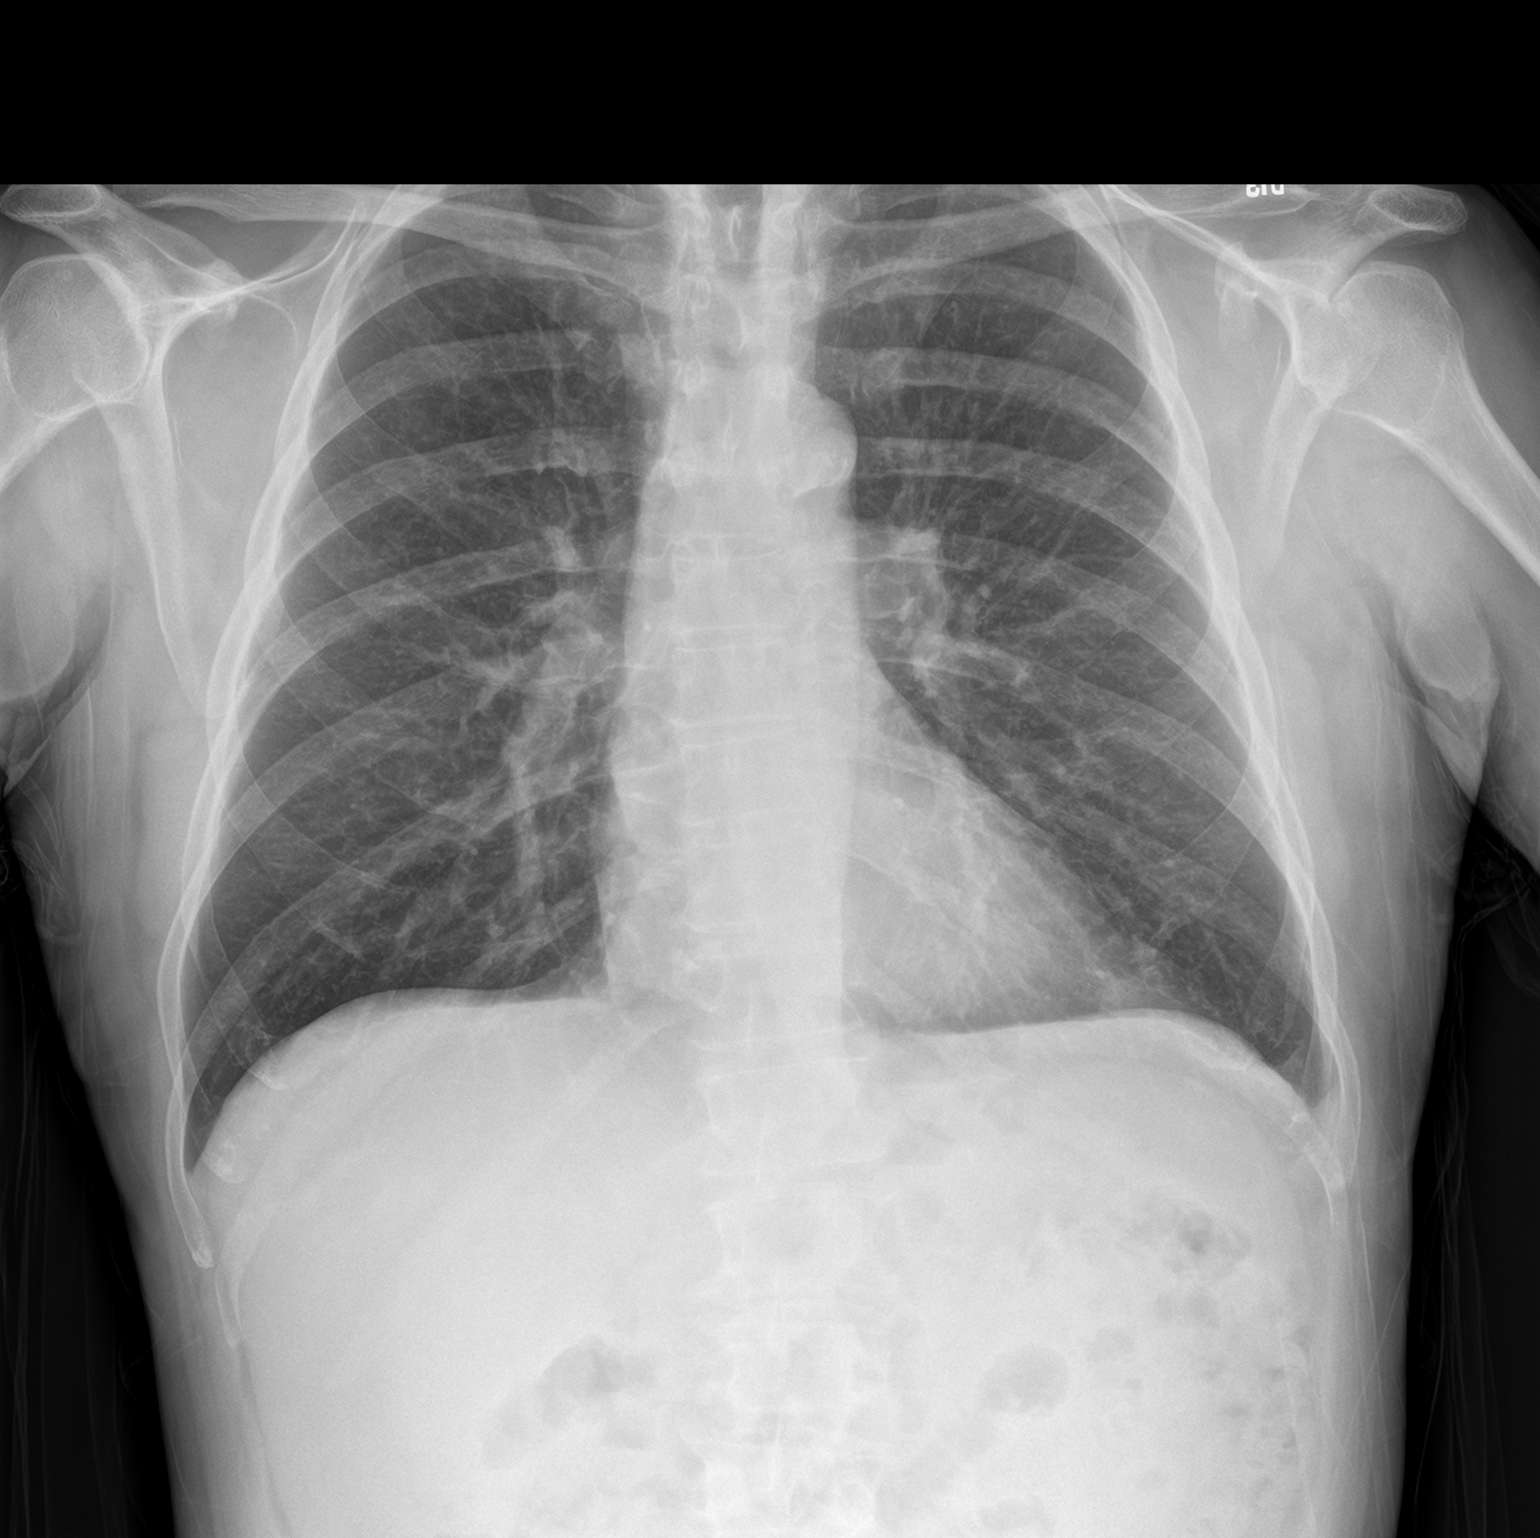

[chest lat]
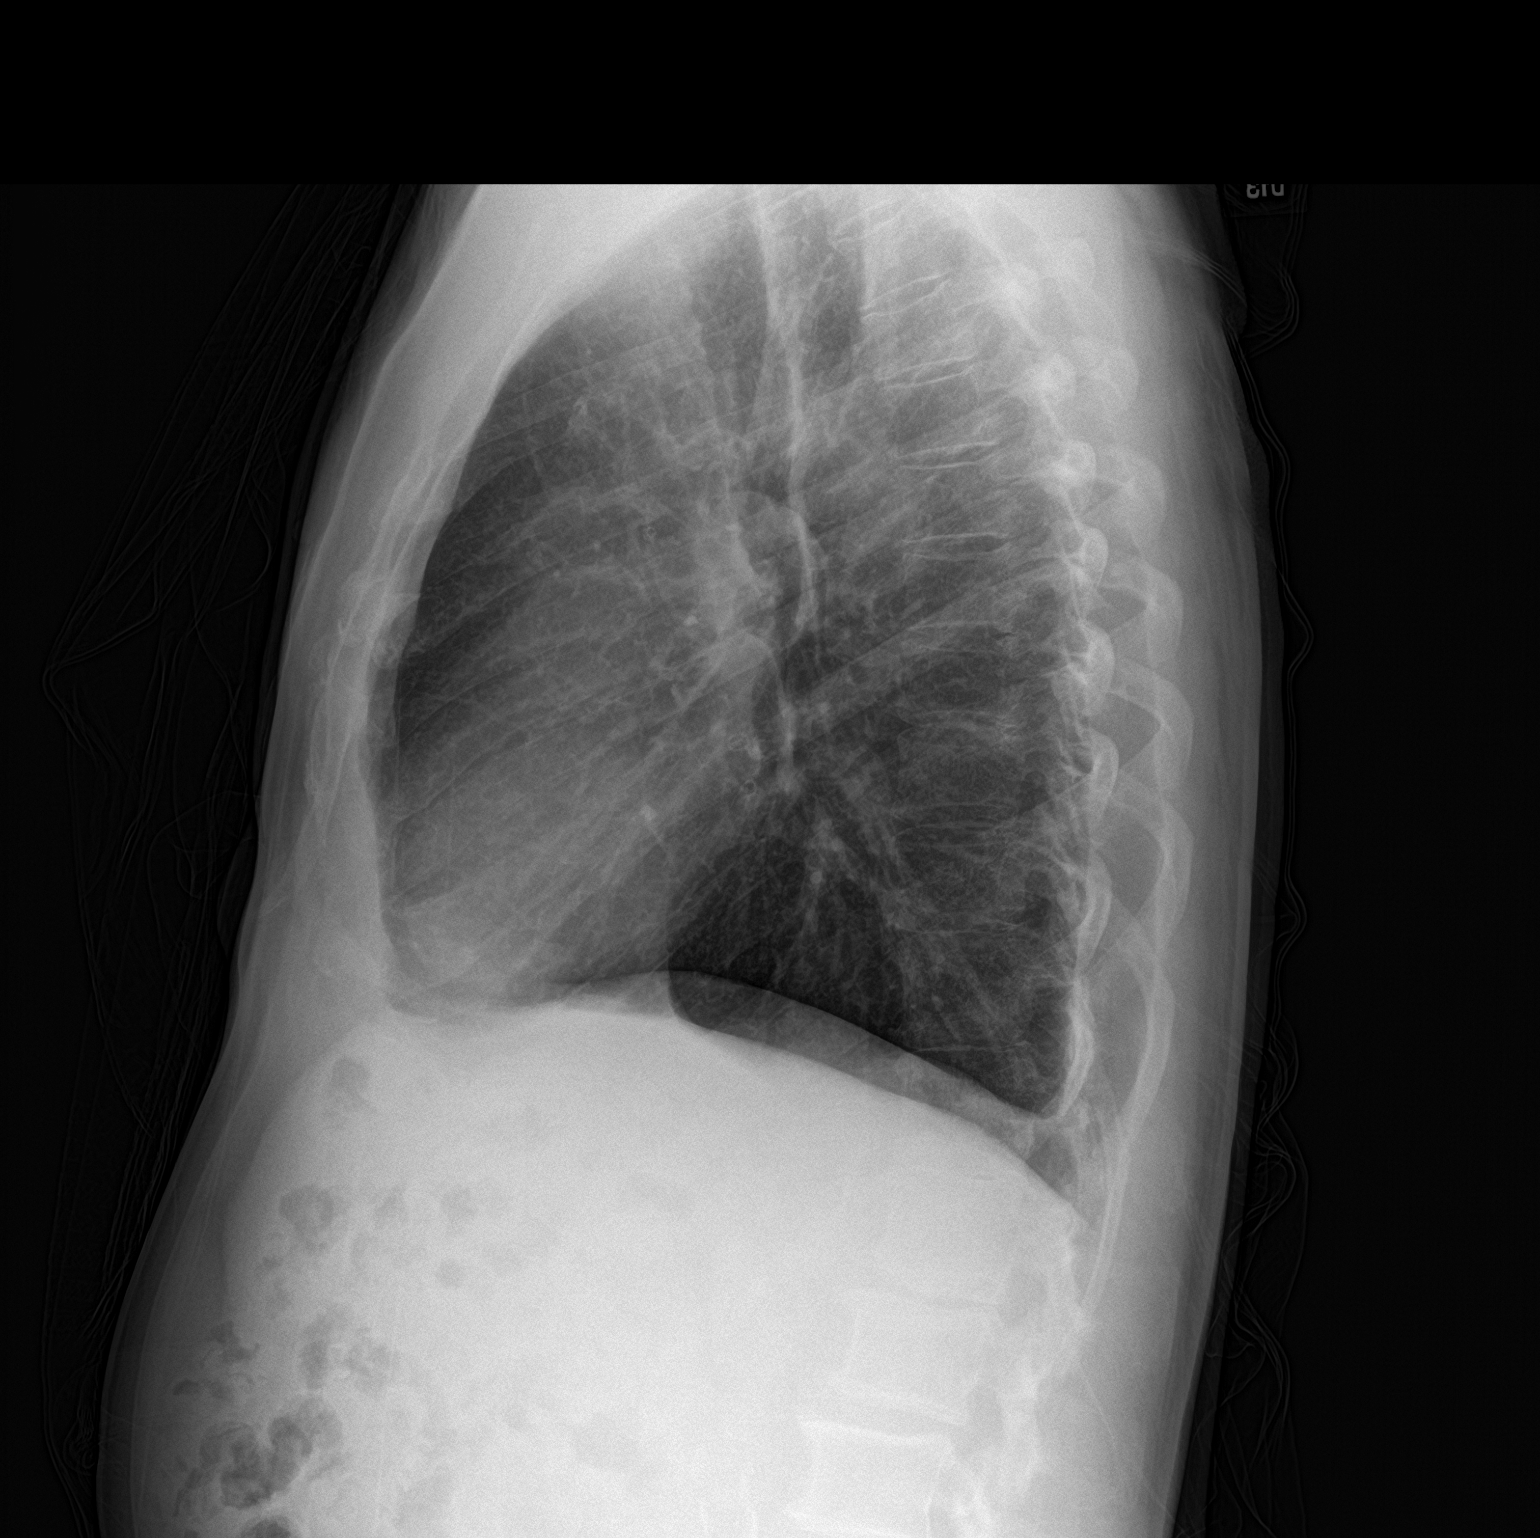

[2 of 2 positions shown; findings below may reference images not displayed]

FINDINGS: The cardiomediastinal silhouette is within normal limits. No pleural
effusion. No pneumothorax. No mass or consolidation. No acute
osseous abnormality.
IMPRESSION: No acute findings in the chest.

## 2024-01-30 IMAGING — CT CT ABD-PELV W/ CM
2 of 5 series · 15 of 46 positions shown, 17 images · IV contrast (Omnipaque or Isovue)
Comparison: None.

CLINICAL DATA: Bowel obstruction. Abdominal pain, acute,
nonlocalized.

EXAM:
CT ABDOMEN AND PELVIS WITH CONTRAST
TECHNIQUE: Multidetector CT imaging of the abdomen and pelvis was performed
using the standard protocol following bolus administration of
intravenous contrast.

[Series 2: axial st · axial · 0.72mm/px · z∈[+1339,+1724]mm · 12 of 89 slices shown, 14 images]
[im 6/89  soft-tissue]
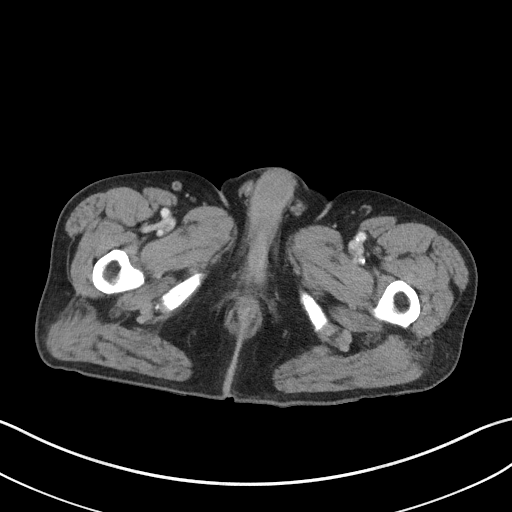
[im 6/89  bone]
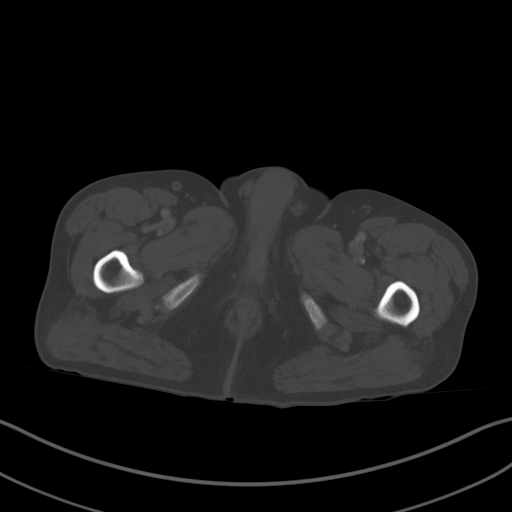
[im 16/89  soft-tissue]
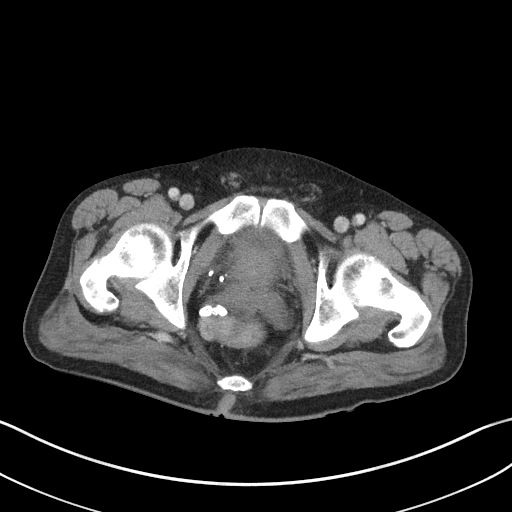
[im 21/89  soft-tissue]
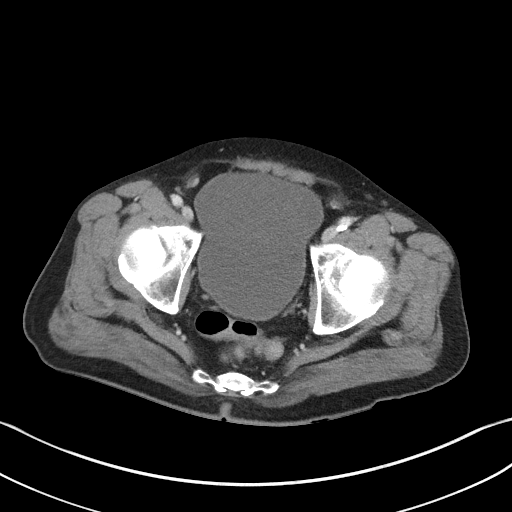
[im 26/89  soft-tissue]
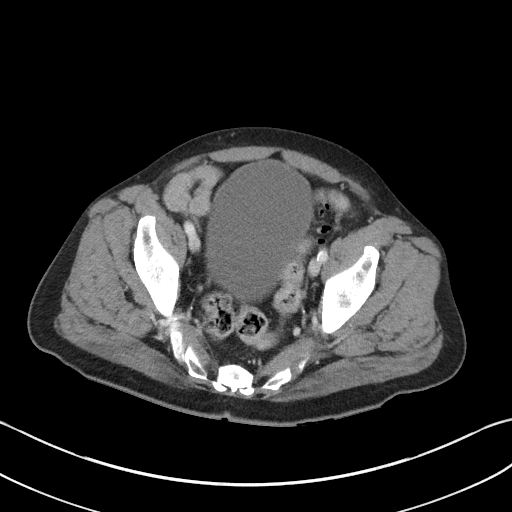
[im 37/89  soft-tissue]
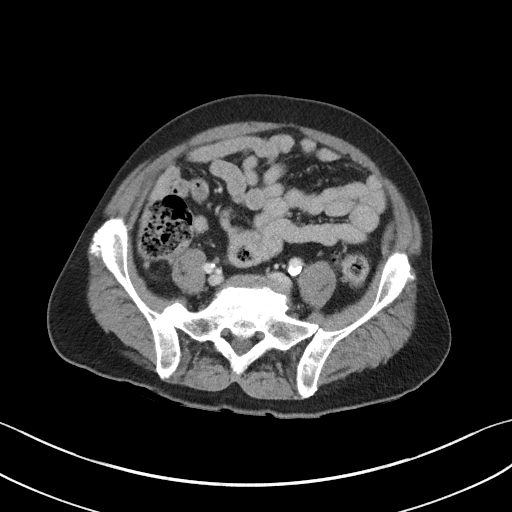
[im 42/89  soft-tissue]
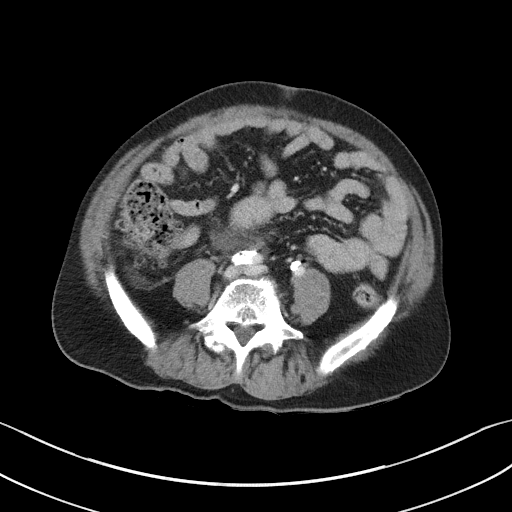
[im 47/89  soft-tissue]
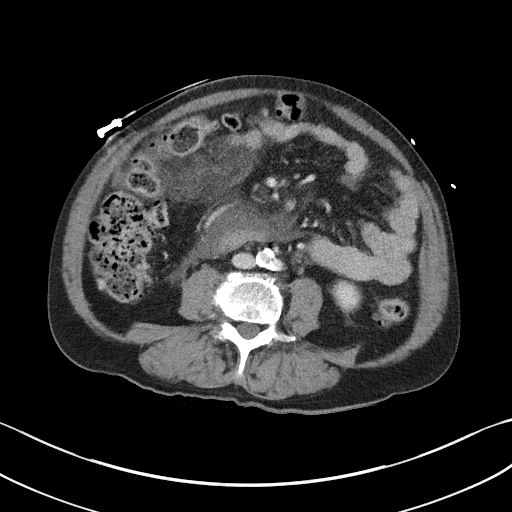
[im 57/89  soft-tissue]
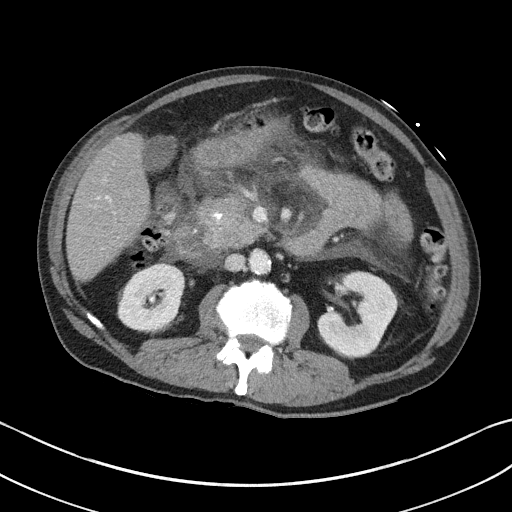
[im 63/89  soft-tissue]
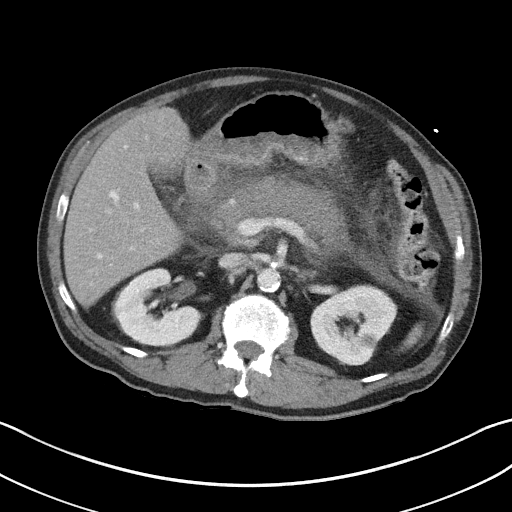
[im 63/89  bone]
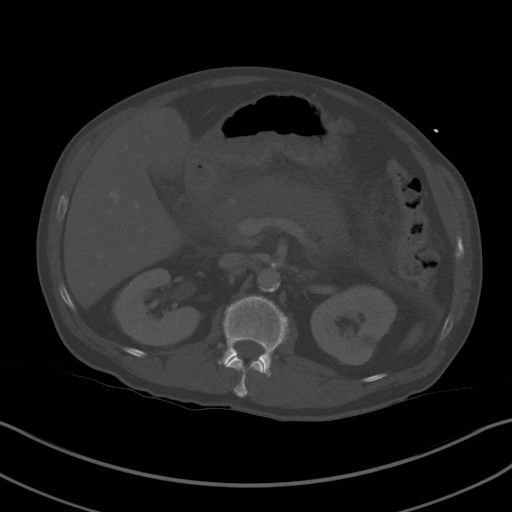
[im 68/89  soft-tissue]
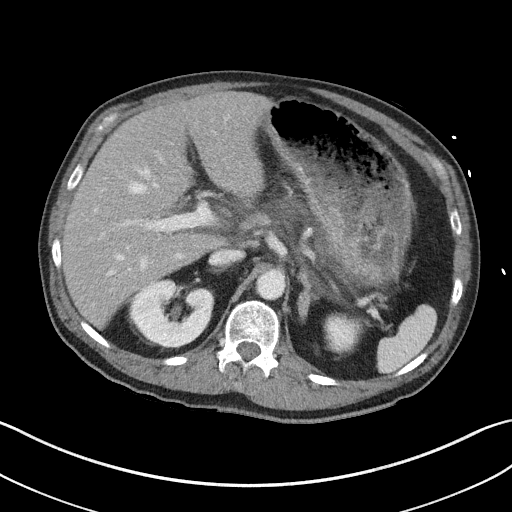
[im 78/89  soft-tissue]
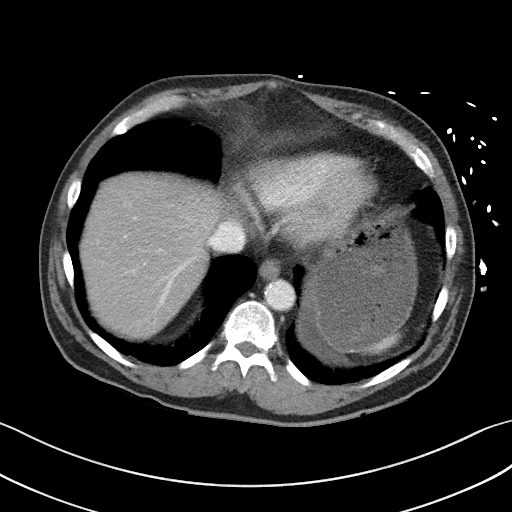
[im 83/89  soft-tissue]
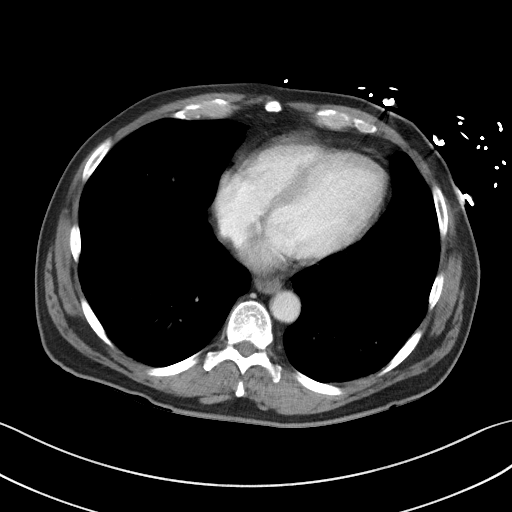

[Series 5: coronal st · coronal · 0.68mm/px · 3 of 91 slices shown]
[im 31/91  soft-tissue]
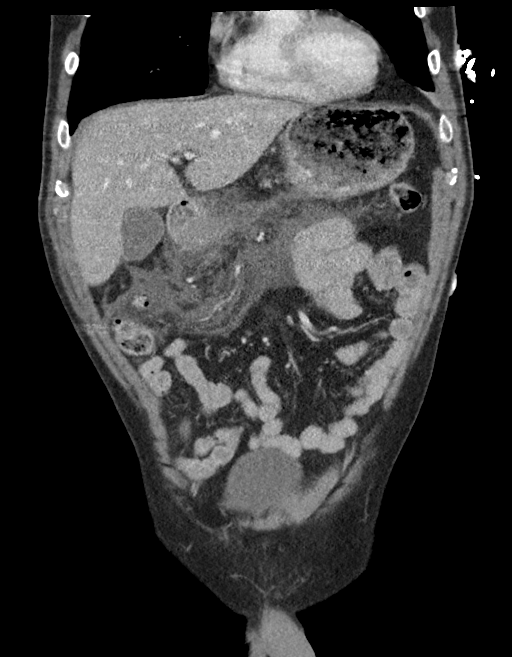
[im 41/91  soft-tissue]
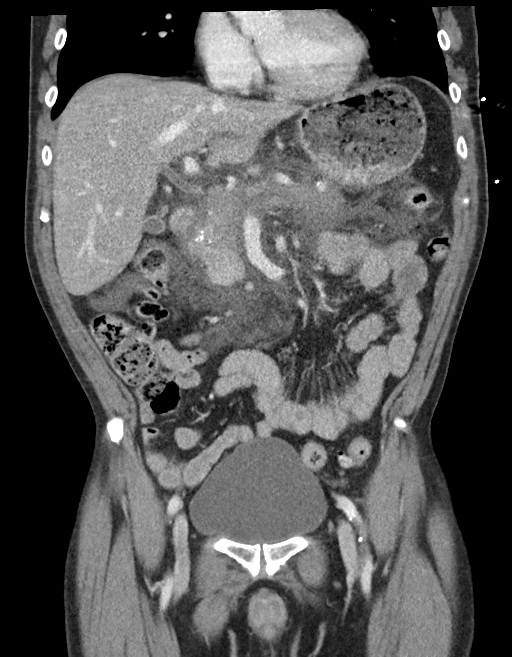
[im 51/91  soft-tissue]
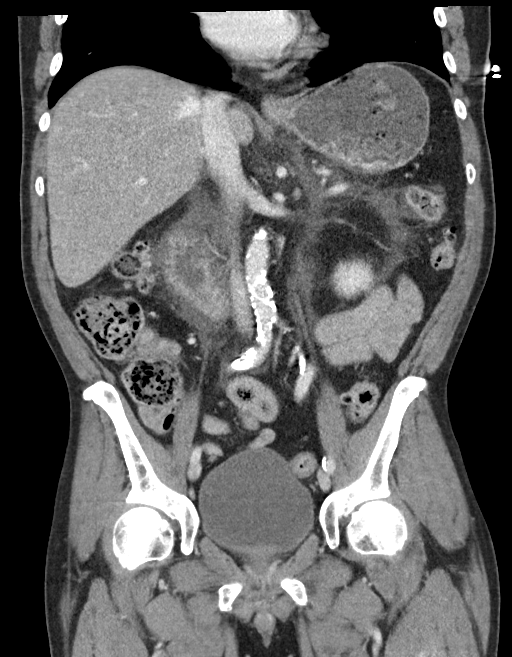

[15 of 46 positions shown; findings below may reference images not displayed]

RADIATION DOSE REDUCTION: This exam was performed according to the
departmental dose-optimization program which includes automated
exposure control, adjustment of the mA and/or kV according to
patient size and/or use of iterative reconstruction technique.

CONTRAST:  75mL OMNIPAQUE IOHEXOL 300 MG/ML  SOLN
FINDINGS: Lower chest: No acute abnormality.

Hepatobiliary: Moderate hepatic steatosis. No enhancing intrahepatic
mass. No intra or extrahepatic biliary ductal dilation. Gallbladder
unremarkable.

Pancreas: There is extensive peripancreatic inflammatory fluid which
tracks into the anterior pararenal spaces bilaterally as well as
into the mesentery and omentum. The pancreatic parenchyma itself
appears thickened and edematous with relative hypoenhancement of the
body and tail the pancreas. No frankly nonenhancing segments of
pancreatic parenchyma are identified, however, to suggest frank
pancreatic necrosis. Calcifications within the head of the pancreas
are in keeping with superimposed changes of chronic pancreatitis.
The pancreatic duct is not dilated. No loculated peripancreatic
fluid collections or necrotic collections are identified. There is
relative hypoenhancement of the adjacent duodenal wall involving the
second portion of the duodenum which may relate to asymmetric edema
and secondary duodenitis in this location.

Spleen: Unremarkable

Adrenals/Urinary Tract: The adrenal glands are unremarkable. The
kidneys are normal in size and position. Cortical cysts are seen
bilaterally. No enhancing intrarenal masses. No intrarenal or
ureteral calculi. No hydronephrosis. The bladder is mildly distended
but is otherwise unremarkable.

Stomach/Bowel: Mild descending and sigmoid colonic diverticulosis.
Moderate ascending diverticulosis. The stomach, small bowel, and
large bowel are otherwise unremarkable. Appendix normal. No free
intraperitoneal gas or fluid.

Vascular/Lymphatic: Moderate aortoiliac atherosclerotic
calcification. No aortic aneurysm. Superior mesenteric vein, splenic
vein, and portal vein are patent. No pathologic adenopathy within
the abdomen and pelvis.

Reproductive: Prostate is unremarkable.

Other: No abdominal wall hernia.

Musculoskeletal: No lytic or blastic bone lesion. No acute bone
abnormality.
IMPRESSION: Acute interstitial/edematous pancreatitis superimposed on chronic
pancreatitis with extensive acute peripancreatic inflammatory fluid.
Relative hypoenhancement of the body and tail the pancreas without
frank pancreatic necrosis identified.

Heterogeneous enhancement of the second portion of the duodenum
likely related to edema and secondary duodenitis related to the
adjacent inflammatory process. No evidence of obstruction or
perforation.

Moderate hepatic steatosis.

Mild to moderate diverticulosis without superimposed acute
inflammatory change.

Aortic Atherosclerosis ([id number]).

## 2024-01-31 ENCOUNTER — Other Ambulatory Visit (HOSPITAL_COMMUNITY): Payer: Self-pay

## 2024-01-31 IMAGING — DX DG CHEST 1V PORT
1 series · 1 of 1 positions shown · non-contrast
Comparison: Portable chest 04/21/2021 and earlier.

CLINICAL DATA: 60-year-old male with productive cough.

EXAM:
PORTABLE CHEST 1 VIEW

[chest ap]
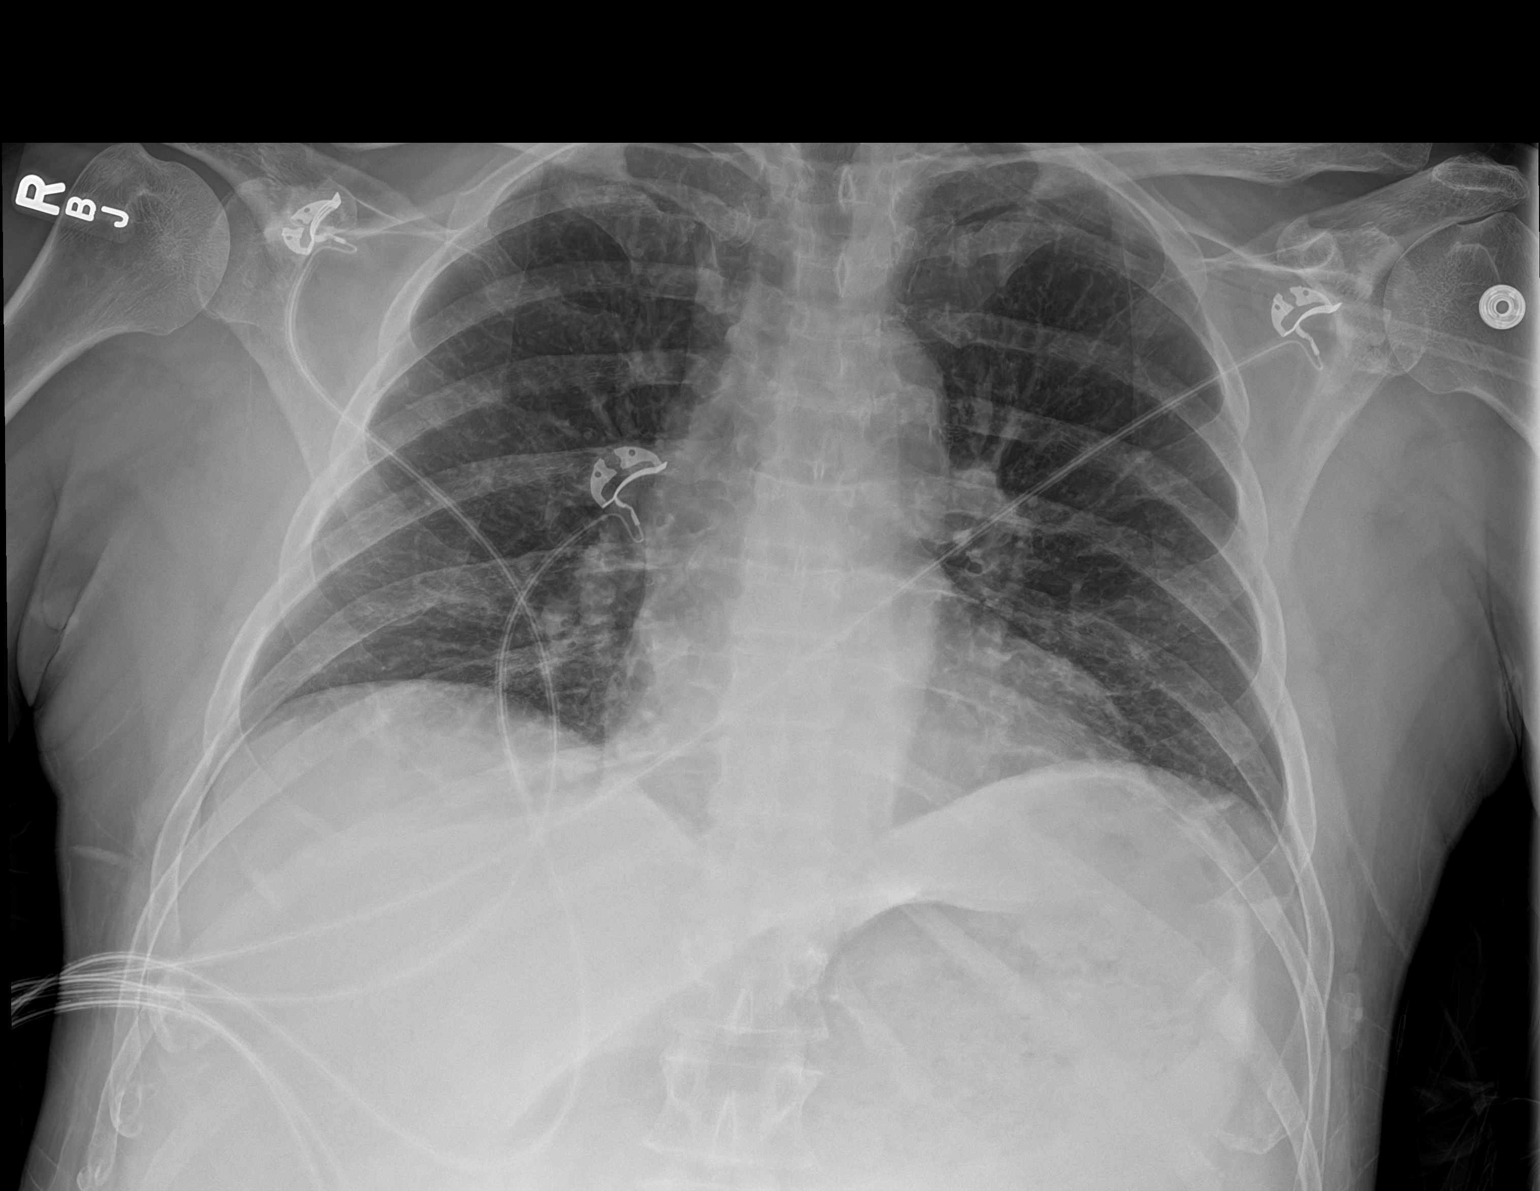

[1 of 1 positions shown; findings below may reference images not displayed]

FINDINGS: Portable AP upright view at 5502 hours. Mildly lower lung volumes,
mild crowding of lower lung markings. Otherwise Allowing for
portable technique the lungs are clear. Normal cardiac size and
mediastinal contours. Visualized tracheal air column is within
normal limits. No pneumothorax or pleural effusion. No acute osseous
abnormality identified. Visible bowel-gas within normal limits.
IMPRESSION: Lower lung volumes.  No acute cardiopulmonary abnormality.

## 2024-02-01 IMAGING — US US RENAL
1 series · 14 of 25 positions shown · non-contrast
Comparison: CT 2 days ago.

CLINICAL DATA: Acute kidney injury.  Hypertension and diabetes.

EXAM:
RENAL / URINARY TRACT ULTRASOUND COMPLETE

[Series 1: us renal · 14 of 43 slices shown]
[im 1/43]
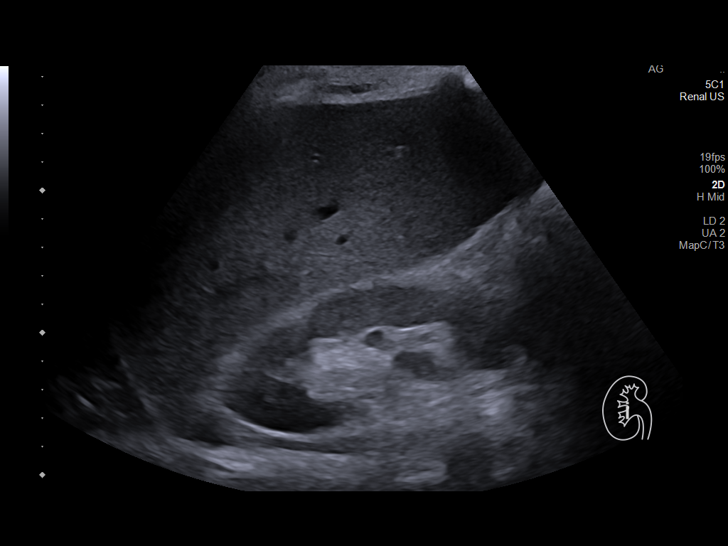
[im 4/43]
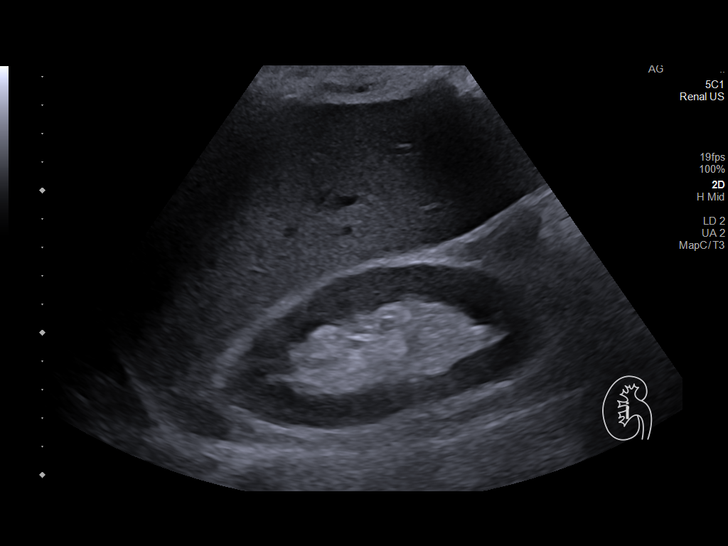
[im 8/43]
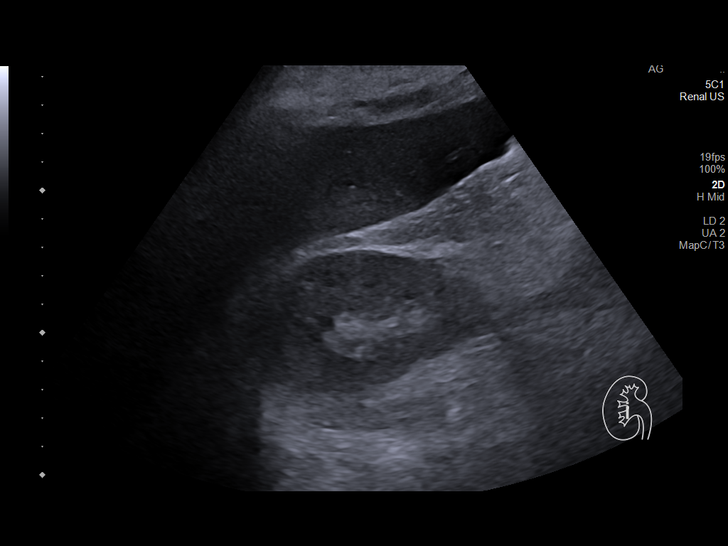
[im 11/43]
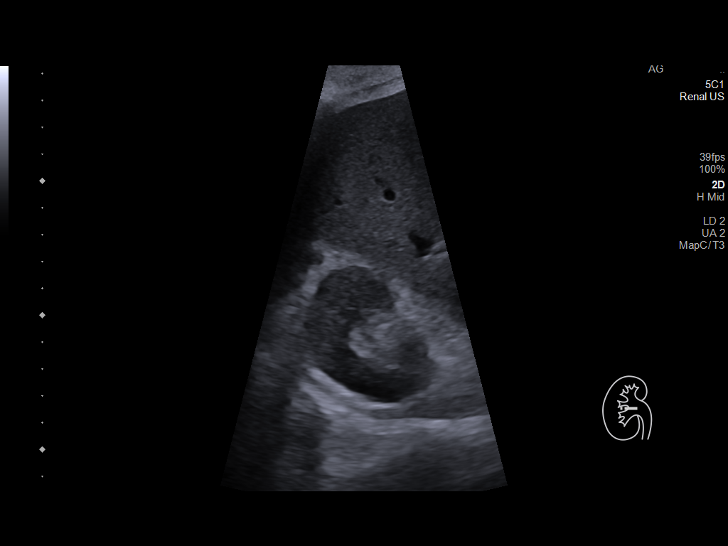
[im 15/43]
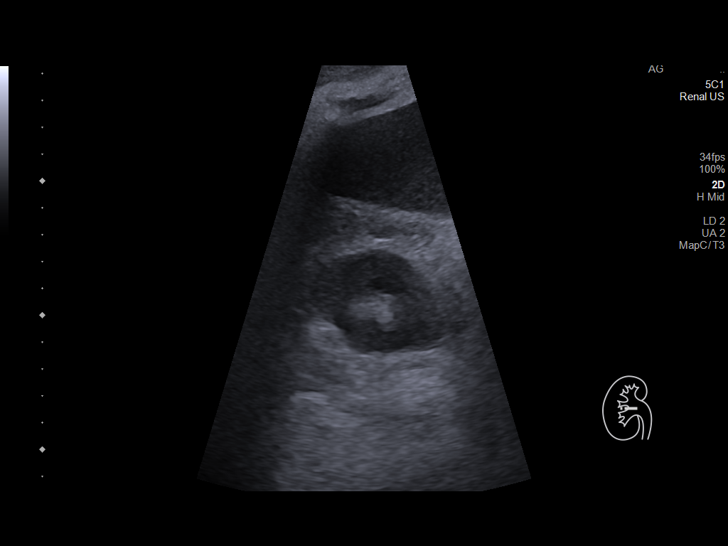
[im 16/43]
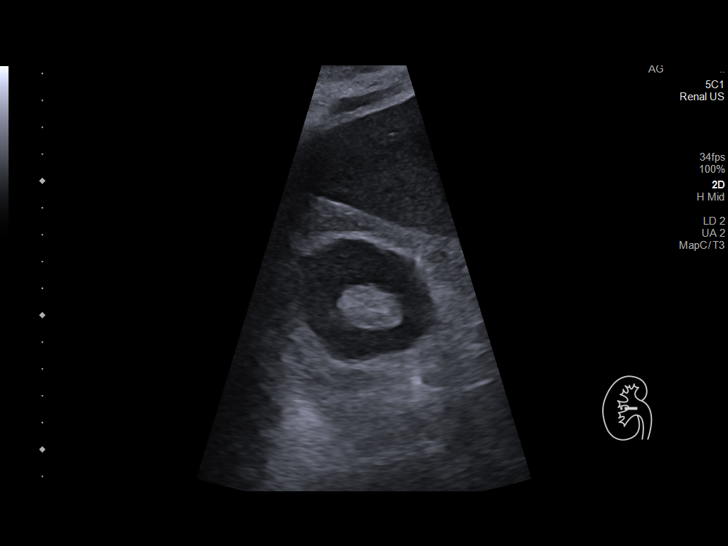
[im 20/43]
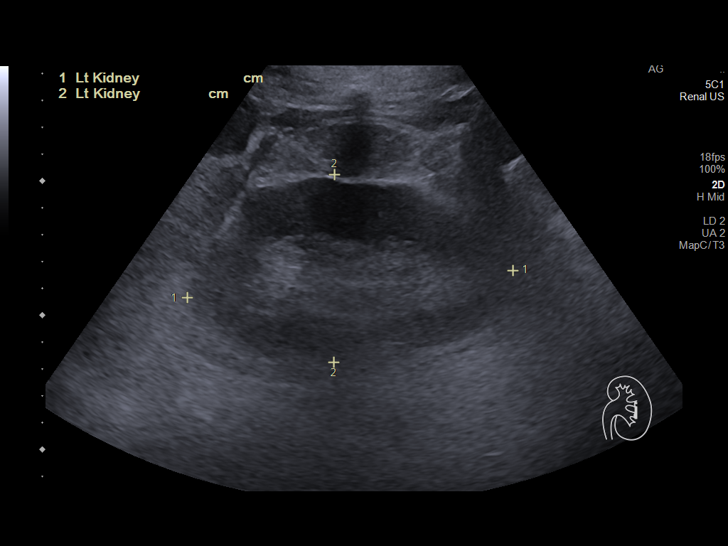
[im 23/43]
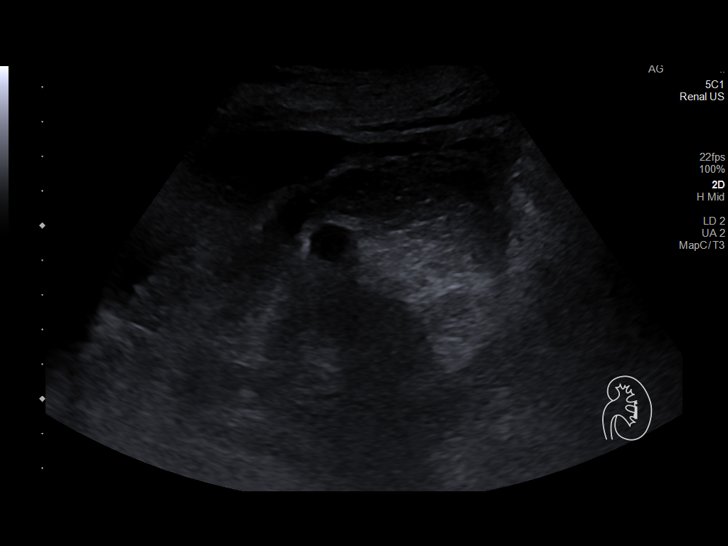
[im 27/43]
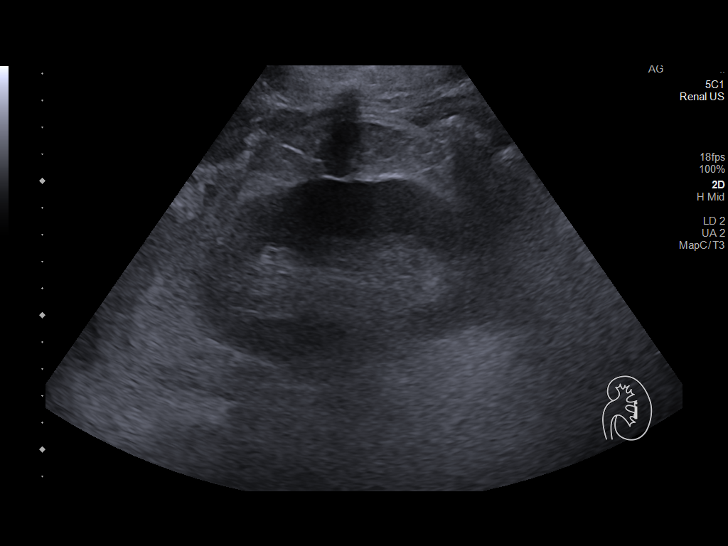
[im 29/43]
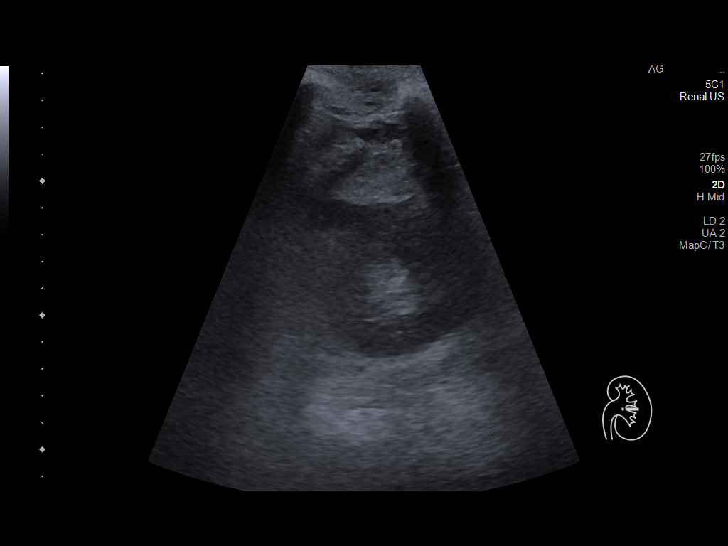
[im 32/43]
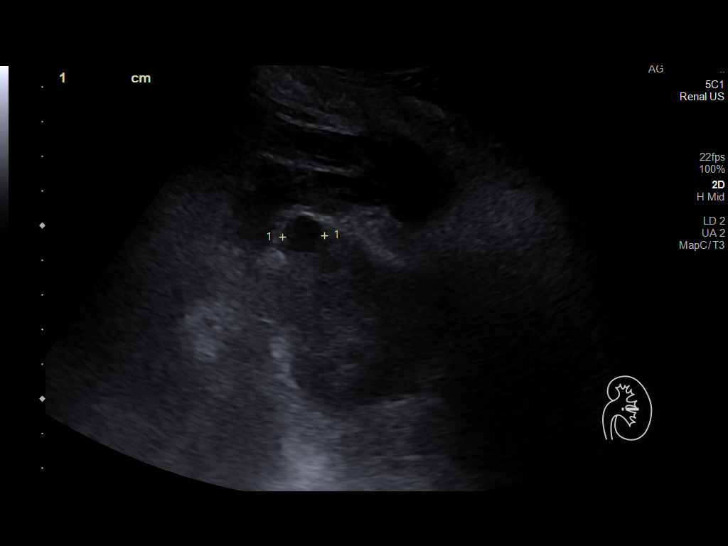
[im 36/43]
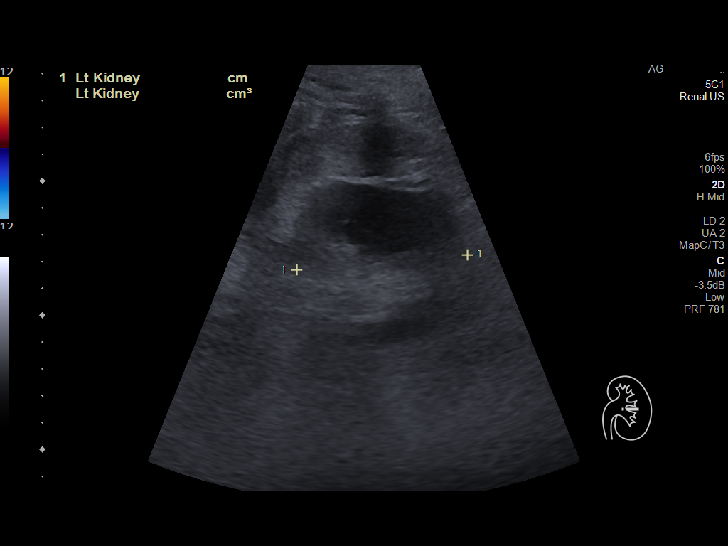
[im 39/43]
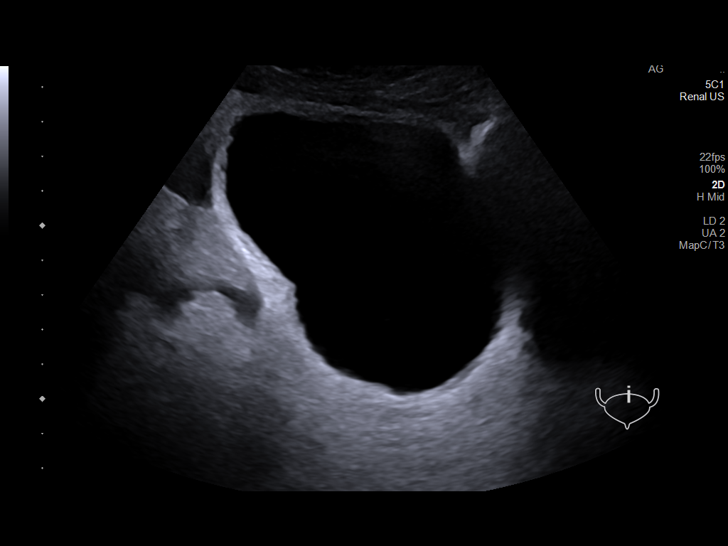
[im 43/43]
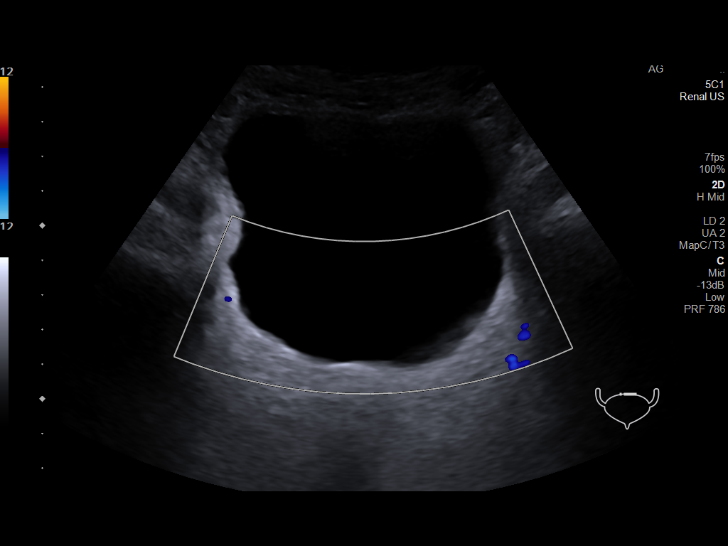

[14 of 25 positions shown; findings below may reference images not displayed]

FINDINGS: Right Kidney:

Renal measurements: 11.7 x 5.2 x 4.8 cm = volume: 155 mL.
Echogenicity within normal limits. No mass or hydronephrosis
visualized.

Left Kidney:

Renal measurements: 12.2 x 6.9 x 6.4 cm = volume: 280 for mL.
Echogenicity within normal limits. No hydronephrosis. 1.2 cm cyst in
the upper pole.

Bladder:

Appears normal for degree of bladder distention.

Other:

None.
IMPRESSION: No hydroureteronephrosis. Renal echogenicity within normal limits.
Insignificant renal cyst on the left. Sonography suggested
enlargement of the left kidney, but I think that this is inaccurate
due to technical factors. Kidneys appear symmetric and normal in
size by CT 2 days ago.

## 2024-02-03 ENCOUNTER — Other Ambulatory Visit: Payer: Self-pay

## 2024-02-04 ENCOUNTER — Other Ambulatory Visit: Payer: Self-pay

## 2024-02-06 ENCOUNTER — Other Ambulatory Visit: Payer: Self-pay

## 2024-02-06 ENCOUNTER — Other Ambulatory Visit (HOSPITAL_COMMUNITY): Payer: Self-pay

## 2024-02-10 ENCOUNTER — Encounter: Payer: Self-pay | Admitting: Surgery

## 2024-02-10 ENCOUNTER — Ambulatory Visit: Admitting: Surgery

## 2024-02-10 VITALS — BP 112/63 | HR 68 | Temp 98.0°F | Resp 14 | Ht 62.0 in | Wt 99.0 lb

## 2024-02-10 DIAGNOSIS — N433 Hydrocele, unspecified: Secondary | ICD-10-CM | POA: Diagnosis not present

## 2024-02-10 NOTE — Progress Notes (Signed)
 [facility] History and Physical  Reason for Referral: Right inguinal cyst Referring Physician: Dr. Sherrilee  Chief Complaint   Follow-up     Kyle Giles is a 64 y.o. male.  HPI: Patient presents for evaluation of a right groin encysted hydrocele.  Starting about a year ago he began having pain in the right groin after holding groceries.  He initially thought this was a hernia.  He will get pain in his groin 2-3 times per month that radiates down into the testicle.  He will get swelling and hardness in the right groin.  He sometimes has trouble with urination during these episodes.  His most recent episode was last night.  The pain will go away after about an hour if he lays down.  He denies any fevers or chills or erythema to this area.  His past medical history significant for hypertension, hyperlipidemia, CKD stage II, diabetes, and pancreatic divisum with recurrent pancreatitis.  He denies use of blood thinning medications.  His surgical history is significant for robotic assisted laparoscopic left inguinal hernia repair by me in April 2024.  He has also undergone EUS with cystogastrostomy with Dr. Wilhelmenia in March 2024.  He smokes about 5 cigarettes/day.  He has not used any alcohol since March 2023.  He denies use of illicit drugs.  Past Medical History:  Diagnosis Date   Anxiety    DM type 2 (diabetes mellitus, type 2) (HCC)    GERD (gastroesophageal reflux disease)    Heart murmur    saw dr bernard 06-29-2019 echo done    Hypercholesterolemia    Hypertension    Palpitations    none in a long time as of 10-19-2019   Pancreatitis    S/P colonoscopy 09/2009   Dr. Mavis: Moderate diverticulosis throughout colon, otherwise normal    Umbilical hernia     Past Surgical History:  Procedure Laterality Date   BALLOON DILATION N/A 05/02/2022   Procedure: BALLOON DILATION;  Surgeon: Wilhelmenia Aloha Raddle., MD;  Location: [facility];  Service:  Gastroenterology;  Laterality: N/A;   BIOPSY  05/02/2022   Procedure: BIOPSY;  Surgeon: Wilhelmenia Aloha Raddle., MD;  Location: [facility];  Service: Gastroenterology;;   BIOPSY  06/10/2022   Procedure: BIOPSY;  Surgeon: Wilhelmenia Aloha Raddle., MD;  Location: [facility];  Service: Gastroenterology;;   COLONOSCOPY N/A 12/21/2019   Procedure: COLONOSCOPY;  Surgeon: Mavis Anes, MD;  Location: [facility];  Service: Gastroenterology;  Laterality: N/A;   cyst removed from finger  age 67   ESOPHAGOGASTRODUODENOSCOPY (EGD) WITH PROPOFOL  N/A 08/13/2021   Procedure: ESOPHAGOGASTRODUODENOSCOPY (EGD) WITH PROPOFOL ;  Surgeon: Cindie Carlin POUR, DO;  Location: [facility];  Service: Endoscopy;  Laterality: N/A;  1:30pm   ESOPHAGOGASTRODUODENOSCOPY (EGD) WITH PROPOFOL  N/A 05/02/2022   Procedure: ESOPHAGOGASTRODUODENOSCOPY (EGD) WITH PROPOFOL ;  Surgeon: Wilhelmenia Aloha Raddle., MD;  Location: [facility];  Service: Gastroenterology;  Laterality: N/A;   ESOPHAGOGASTRODUODENOSCOPY (EGD) WITH PROPOFOL  N/A 06/10/2022   Procedure: ESOPHAGOGASTRODUODENOSCOPY (EGD) WITH PROPOFOL ;  Surgeon: Wilhelmenia Aloha Raddle., MD;  Location: [facility];  Service: Gastroenterology;  Laterality: N/A;   EUS N/A 05/02/2022   Procedure: UPPER ENDOSCOPIC ULTRASOUND (EUS) RADIAL;  Surgeon: Wilhelmenia Aloha Raddle., MD;  Location: [facility];  Service: Gastroenterology;  Laterality: N/A;   HERNIA REPAIR     as baby   PANCREATIC STENT PLACEMENT  05/02/2022   Procedure: CYSTGASTROSTOMY;  Surgeon: Mansouraty, Aloha Raddle., MD;  Location: [facility];  Service: Gastroenterology;;   POLYPECTOMY  12/21/2019  Procedure: POLYPECTOMY;  Surgeon: Mavis Anes, MD;  Location: [facility];  Service: Gastroenterology;;   pyloric stenosis repair     as baby, operated at [facility] REMOVAL  06/10/2022   Procedure: STENT REMOVAL;  Surgeon: Wilhelmenia Aloha Raddle., MD;  Location: [facility];  Service: Gastroenterology;;   UMBILICAL  HERNIA REPAIR N/A 10/29/2019   Procedure: OPEN UMBILICAL HERNIA REPAIR WITH MESH;  Surgeon: Signe Mitzie LABOR, MD;  Location: [facility];  Service: General;  Laterality: N/A;   XI ROBOTIC ASSISTED INGUINAL HERNIA REPAIR WITH MESH Left 05/13/2022   Procedure: XI ROBOTIC ASSISTED INGUINAL HERNIA REPAIR WITH MESH;  Surgeon: Evonnie Dorothyann LABOR, DO;  Location: [facility];  Service: General;  Laterality: Left;    Family History  Problem Relation Age of Onset   Colon cancer Neg Hx     Social History[1]  Medications: I have reviewed the patient's current medications. Allergies as of 02/10/2024   No Known Allergies      Medication List        Accurate as of February 10, 2024 10:31 AM. If you have any questions, ask your nurse or doctor.          albuterol  108 (90 Base) MCG/ACT inhaler Commonly known as: VENTOLIN  HFA Inhale 2 puffs into the lungs every 4 (four) hours.   ALPRAZolam  1 MG tablet Commonly known as: Xanax  Take 1 tablet (1 mg total) by mouth 3 (three) times daily.   ALPRAZolam  1 MG tablet Commonly known as: Xanax  Take 1 tablet (1 mg total) by mouth 3 (three) times daily.   atorvastatin  10 MG tablet Commonly known as: LIPITOR Take 1 tablet (10 mg total) by mouth daily.   cyclobenzaprine  5 MG tablet Commonly known as: FLEXERIL  Take 1 tablet (5 mg total) by mouth 3 (three) times daily as needed for muscle spasms.   dicyclomine  10 MG capsule Commonly known as: BENTYL  Take 1 capsule (10 mg total) by mouth daily as needed for spasms.   diltiazem  180 MG 24 hr capsule Commonly known as: DILACOR XR  Take 1 capsule (180 mg total) by mouth every morning on an empty stomach.   esomeprazole  40 MG capsule Commonly known as: NEXIUM  Take 1 capsule (40 mg total) by mouth daily before breakfast.   fenofibrate  160 MG tablet Take 1 tablet (160 mg total) by mouth daily.   fenofibrate  160 MG tablet Take 1 tablet (160 mg total) by mouth daily.   FreeStyle Libre 3  Plus Sensor Misc Use to check blood glucose continuously. Apply 1 new sensor every 15 days   FreeStyle Libre 3 Plus Sensor Misc apply new sensor every 15 days   Jardiance  10 MG Tabs tablet Generic drug: empagliflozin  Take 1 tablet (10 mg total) by mouth every morning.   olmesartan  20 MG tablet Commonly known as: BENICAR  Take 1 tablet (20 mg total) by mouth daily.   Systane Complete 0.6 % Soln Generic drug: Propylene Glycol Place 1 drop into both eyes daily as needed (dry eyes).   traMADol 50 MG tablet Commonly known as: ULTRAM Take 100 mg by mouth every 6 (six) hours.   Vitamin D  50 MCG (2000 UT) tablet Take 2,000 Units by mouth 2 (two) times daily.   Zenpep  40000-126000 units Cpep Generic drug: Pancrelipase  (Lip-Prot-Amyl) Take 1 capsule (40,000 Units total) by mouth 3 (three) times daily with meals.         ROS:  Constitutional: negative for chills, fatigue, and fevers Eyes: negative for visual  disturbance and pain Ears, nose, mouth, throat, and face: negative for ear drainage, sore throat, and sinus problems Respiratory: negative for cough, wheezing, and shortness of breath Cardiovascular: negative for chest pain and palpitations Gastrointestinal: positive for abdominal pain, negative for nausea, reflux symptoms, and vomiting Genitourinary:negative for dysuria and frequency Integument/breast: negative for dryness and rash Hematologic/lymphatic: negative for bleeding and lymphadenopathy Musculoskeletal:positive for back pain, negative for neck pain Neurological: negative for dizziness and tremors Endocrine: negative for temperature intolerance  Blood pressure 112/63, pulse 68, temperature 98 F (36.7 C), temperature source Oral, resp. rate 14, height 5' 2 (1.575 m), weight 99 lb (44.9 kg), SpO2 94%. Physical Exam Vitals reviewed.  Constitutional:      Appearance: Normal appearance.  HENT:     Head: Normocephalic and atraumatic.  Eyes:     Extraocular  Movements: Extraocular movements intact.     Pupils: Pupils are equal, round, and reactive to light.  Cardiovascular:     Rate and Rhythm: Normal rate and regular rhythm.  Pulmonary:     Effort: Pulmonary effort is normal.     Breath sounds: Normal breath sounds.  Abdominal:     Comments: Abdomen soft, nondistended, no percussion tenderness, nontender palpation; no rigidity, guarding, rebound tenderness; right groin without palpable hernia, unable to palpate hydrocele visualized on MRI  Musculoskeletal:        General: Normal range of motion.     Cervical back: Normal range of motion.  Skin:    General: Skin is warm and dry.  Neurological:     General: No focal deficit present.     Mental Status: He is alert and oriented to person, place, and time.  Psychiatric:        Mood and Affect: Mood normal.        Behavior: Behavior normal.     Results: MRI Pelvis (07/15/23): IMPRESSION: 2 cm encysted hydrocele in the proximal right inguinal canal.   Colonic diverticulosis, without signs of diverticulitis.  Assessment & Plan:  Kyle Giles is a 64 y.o. male who presents for evaluation of a right groin cyst/hydrocele.  -We discussed the pathophysiology of hydroceles and that this is a fluid collection along his spermatic cord in the inguinal canal.  I explained that we can explore this area and I can attempt to remove it, however if I am unable to palpate it on the day of the procedure, there is a chance that I will not be able to remove it at that time -The risk and benefits of right groin hydrocele excision were discussed including but not limited to bleeding, infection, injury to surrounding structures, need for additional procedures, and failure to remove hydrocele.  After careful consideration, AHMIR BRACKEN would like to consider his options -Information provided to the patient regarding hydroceles -Patient will call the office once he speaks with his wife  All questions were  answered to the satisfaction of the patient.  Note: Portions of this report may have been transcribed using voice recognition software. Every effort has been made to ensure accuracy; however, inadvertent computerized transcription errors may still be present.   Dorothyann Brittle, DO [facility] [address] [place], [place] [address] [phone] (office)         [1]  Social History Tobacco Use   Smoking status: Every Day    Current packs/day: 1.50    Average packs/day: 1.5 packs/day for 30.0 years (45.0 ttl pk-yrs)    Types: Cigarettes    Passive  exposure: Current   Smokeless tobacco: Never   Tobacco comments:    1 ppd since 3 weeks as of 10-19-2019  Vaping Use   Vaping status: Never Used  Substance Use Topics   Alcohol use: Not Currently    Comment: quit 05/30/21   Drug use: Not Currently

## 2024-02-16 ENCOUNTER — Telehealth: Payer: Self-pay | Admitting: *Deleted

## 2024-02-16 NOTE — Telephone Encounter (Signed)
 Received call from patient [phone]~ telephone.   Patient reports that he prefers to continue to monitor area at this time. States that he is not sure if hydrocele will require intervention at this time.

## 2024-02-22 ENCOUNTER — Other Ambulatory Visit (HOSPITAL_COMMUNITY): Payer: Self-pay

## 2024-02-23 ENCOUNTER — Other Ambulatory Visit: Payer: Self-pay

## 2024-03-08 ENCOUNTER — Other Ambulatory Visit: Payer: Self-pay

## 2024-05-06 ENCOUNTER — Ambulatory Visit: Admitting: Nurse Practitioner

## 2024-05-10 ENCOUNTER — Ambulatory Visit: Admitting: Gastroenterology
